# Patient Record
Sex: Male | Born: 1945
Health system: Southern US, Community
[De-identification: ages and names within clinical notes are randomized; demographics above are authoritative.]

## PROBLEM LIST (undated history)

## (undated) DIAGNOSIS — H409 Unspecified glaucoma: Secondary | ICD-10-CM

## (undated) DIAGNOSIS — I714 Abdominal aortic aneurysm, without rupture, unspecified: Secondary | ICD-10-CM

## (undated) DIAGNOSIS — Z973 Presence of spectacles and contact lenses: Secondary | ICD-10-CM

## (undated) DIAGNOSIS — C3490 Malignant neoplasm of unspecified part of unspecified bronchus or lung: Secondary | ICD-10-CM

## (undated) DIAGNOSIS — E785 Hyperlipidemia, unspecified: Secondary | ICD-10-CM

## (undated) DIAGNOSIS — I219 Acute myocardial infarction, unspecified: Secondary | ICD-10-CM

## (undated) DIAGNOSIS — C801 Malignant (primary) neoplasm, unspecified: Secondary | ICD-10-CM

## (undated) DIAGNOSIS — I10 Essential (primary) hypertension: Secondary | ICD-10-CM

## (undated) DIAGNOSIS — I2510 Atherosclerotic heart disease of native coronary artery without angina pectoris: Secondary | ICD-10-CM

## (undated) HISTORY — DX: Essential (primary) hypertension: I10

## (undated) HISTORY — DX: Acute myocardial infarction, unspecified: I21.9

## (undated) HISTORY — DX: Hyperlipidemia, unspecified: E78.5

## (undated) HISTORY — DX: Abdominal aortic aneurysm, without rupture: I71.4

## (undated) HISTORY — PX: APPENDECTOMY: SHX54

## (undated) HISTORY — PX: EYE SURGERY: SHX253

## (undated) HISTORY — DX: Atherosclerotic heart disease of native coronary artery without angina pectoris: I25.10

## (undated) HISTORY — PX: COLONOSCOPY W/ BIOPSIES AND POLYPECTOMY: SHX1376

## (undated) HISTORY — DX: Abdominal aortic aneurysm, without rupture, unspecified: I71.40

---

## 1999-07-25 ENCOUNTER — Ambulatory Visit (HOSPITAL_COMMUNITY): Admission: RE | Admit: 1999-07-25 | Discharge: 1999-07-25 | Payer: Self-pay | Admitting: Gastroenterology

## 1999-07-25 ENCOUNTER — Encounter (INDEPENDENT_AMBULATORY_CARE_PROVIDER_SITE_OTHER): Payer: Self-pay

## 2002-08-19 ENCOUNTER — Ambulatory Visit (HOSPITAL_COMMUNITY): Admission: RE | Admit: 2002-08-19 | Discharge: 2002-08-19 | Payer: Self-pay | Admitting: Gastroenterology

## 2002-08-19 ENCOUNTER — Encounter (INDEPENDENT_AMBULATORY_CARE_PROVIDER_SITE_OTHER): Payer: Self-pay | Admitting: Specialist

## 2003-03-02 DIAGNOSIS — I219 Acute myocardial infarction, unspecified: Secondary | ICD-10-CM

## 2003-03-02 HISTORY — DX: Acute myocardial infarction, unspecified: I21.9

## 2003-03-15 HISTORY — PX: OTHER SURGICAL HISTORY: SHX169

## 2004-05-20 ENCOUNTER — Emergency Department (HOSPITAL_COMMUNITY): Admission: EM | Admit: 2004-05-20 | Discharge: 2004-05-21 | Payer: Self-pay | Admitting: *Deleted

## 2004-05-22 ENCOUNTER — Ambulatory Visit (HOSPITAL_COMMUNITY): Admission: RE | Admit: 2004-05-22 | Discharge: 2004-05-22 | Payer: Self-pay | Admitting: Emergency Medicine

## 2004-12-07 ENCOUNTER — Encounter (INDEPENDENT_AMBULATORY_CARE_PROVIDER_SITE_OTHER): Payer: Self-pay | Admitting: Specialist

## 2004-12-07 ENCOUNTER — Observation Stay (HOSPITAL_COMMUNITY): Admission: RE | Admit: 2004-12-07 | Discharge: 2004-12-08 | Payer: Self-pay | Admitting: Surgery

## 2005-01-29 HISTORY — PX: CHOLECYSTECTOMY: SHX55

## 2009-11-10 ENCOUNTER — Ambulatory Visit: Payer: Self-pay | Admitting: Vascular Surgery

## 2009-11-17 ENCOUNTER — Encounter: Admission: RE | Admit: 2009-11-17 | Discharge: 2009-11-17 | Payer: Self-pay | Admitting: Vascular Surgery

## 2009-11-17 ENCOUNTER — Ambulatory Visit: Payer: Self-pay | Admitting: Vascular Surgery

## 2010-05-19 ENCOUNTER — Encounter (INDEPENDENT_AMBULATORY_CARE_PROVIDER_SITE_OTHER): Payer: Medicare Other

## 2010-05-19 DIAGNOSIS — I714 Abdominal aortic aneurysm, without rupture, unspecified: Secondary | ICD-10-CM

## 2010-05-25 NOTE — Procedures (Unsigned)
DUPLEX ULTRASOUND OF ABDOMINAL AORTA  INDICATION:  Abdominal aortic aneurysm.  HISTORY: Diabetes:  No Cardiac:  MI, stents 2005 Hypertension:  Yes Smoking:  Previous to cigarettes, currently smoking cigars Connective Tissue Disorder: Family History:  No Previous Surgery:  No  DUPLEX EXAM:         AP (cm)                   TRANSVERSE (cm) Proximal             1.77 cm                   1.75 cm Mid                  4.10 cm                   4.22 cm Distal               2.38 cm                   2.36 cm Right Iliac          2.03 cm                   1.83 cm Left Iliac           1.30 cm                   1.48 cm  PREVIOUS:  Date: November 17, 2009  AP:  4.1 cm  TRANSVERSE:  IMPRESSION: 1. Infrarenal abdominal aortic aneurysm present with a measurement of     4.10 cm x 4.22 cm with intramural thrombus present. 2. Aneurysmal dilatation of the right common iliac artery measuring     2.03 cm x 1.83 cm with intramural thrombus present also. 3. Diameter of infrarenal abdominal aortic aneurysm consistent with CT     on November 17, 2009.  ___________________________________________ Janetta Hora. Fields, MD  SH/MEDQ  D:  05/19/2010  T:  05/19/2010  Job:  [id number]

## 2010-06-13 NOTE — Assessment & Plan Note (Signed)
OFFICE VISIT   DRAGAN, TAMBURRINO  DOB:  [birth year]                                       11/10/2009  VWUJW#:[id number]   CHIEF COMPLAINT:  Abdominal aortic aneurysm.   HISTORY OF PRESENT ILLNESS:  The patient is a 65 year old male referred  by Dr. Eldridge Dace for evaluation of abdominal aortic aneurysm.  His  aneurysm was initially found on ultrasound screening in April 2011.  It  is found to be 3.8 cm in diameter at that time.  He had a recent follow-  up ultrasound which showed that the aneurysm is now 4.6 cm in diameter.  He denies any chronic back pain.  He has had a little bit of epigastric  pain which has been on and off earlier this week.  He denies other  abdominal pain.  He has no family history of abdominal aortic aneurysm.  Previous abdominal procedures include a cholecystectomy and an  appendectomy.   Chronic medical problems include coronary artery disease, hypertension,  elevated cholesterol.  These are all currently stable and followed by  Dr. Gerri Spore and Dr. Eldridge Dace.   PAST MEDICAL HISTORY:  Otherwise noncontributory.   SOCIAL HISTORY:  He is widowed.  He has 1 child.  He smokes 1 to 2  cigars per day.  He does not consume alcohol regularly.   FAMILY HISTORY:  Noncontributory, as mentioned above.   A full 12-point review of systems was performed with the patient today.  Please see intake referral form for details regarding this.   Approximately 15 pages of outside medical records from Dr. Hoyle Barr  office were reviewed today as part of his exam.   MEDICATIONS:  Include tamsulosin 0.4 mg after meals, ramipril 5 mg once  daily, metoprolol 25 mg once daily, Aleve 220 mg 2 daily, aspirin 81 mg  once daily, Azopt eyedrops to right eye once daily, Lantaprost eye drops  right eye once daily.   He has no known drug allergies.   PHYSICAL EXAMINATION:  Blood pressure is 132/85 in the left arm.  Heart  rate is 86 and regular.  Oxygen  saturation is 99% on room air.  HEENT:  Unremarkable.  Neck has 2+ carotid pulses without bruit.  Chest:  Clear  to auscultation.  Cardiac:  Regular rate and rhythm without murmur.  Abdomen:  Soft, nontender, nondistended.  He does have an easily  palpable epigastric pulsatile mass.  This is also nontender.  Extremities:  He has 2+ radial and femoral, 1+ popliteal, and 2+  posterior tibial pulses bilaterally.  He has absent dorsalis pedis  pulses.  Musculoskeletal exam shows no major joint deformities.  Neurologic exam shows symmetric upper extremity and lower extremity  motor strength, 5/5.  Skin has no open ulcers or rashes.   Patient apparently has a growing abdominal aortic aneurysm.  I discussed  with him today that ultrasound can sometimes have discrepancies in size,  and we will obtain a CT angiogram to abdomen and pelvis to further  define the anatomy of his aneurysm.  If this is relatively small in  size, we may consider further CT scan surveillance for awhile before  considering repair since he is currently asymptomatic.  However, if the  aneurysm is 5 to 5.5 cm in diameter, we would consider repair.  All of  this was discussed with the patient today as  well as possible techniques  for repair, including open and endovascular stent graft repair.     Janetta Hora. Fields, MD  Electronically Signed   CEF/MEDQ  D:  11/10/2009  T:  11/10/2009  Job:  [id number]   cc:   Corky Crafts, MD  Carola J. Gerri Spore, M.D.

## 2010-06-13 NOTE — Assessment & Plan Note (Signed)
OFFICE VISIT   Riley Sharp, Riley Sharp  DOB:  11-11-1945                                       11/17/2009  EAVWU#:[id number]   The patient returns for followup today after his CT scan of the abdomen  and pelvis to further evaluate his abdominal aortic aneurysm.  He  continues to deny any abdominal or back pain.   REVIEW OF SYSTEMS:  He denies any chest pain or shortness of breath.   PHYSICAL EXAM:  Today blood pressure is 120/75 in the left arm, heart  rate is 81 and regular.  Abdomen is soft, nontender, nondistended.  Epigastric mass which is pulsatile is palpable but nontender.   I reviewed his CT angiogram of the abdomen and pelvis today which on my  measurement shows the aneurysm is approximately 4.5 cm in diameter.  At  this point I do not believe that he needs repair of the aneurysm.  However, he certainly warrants close followup as the aneurysm may  continue to grow over time.  We will arrange for him to have a followup  ultrasound in six months' time.  The aneurysm did seem like it would be  amendable to stent graft repair if it ever grows to the size of 5 to 5.5  cm in diameter.  All this was discussed with the patient today.  He will  see me again in six months' time.     Janetta Hora. Fields, MD  Electronically Signed   CEF/MEDQ  D:  11/17/2009  T:  11/18/2009  Job:  [id number]   cc:   Corky Crafts, MD  Carola J. Gerri Spore, M.D.

## 2010-06-16 NOTE — Procedures (Signed)
[facility]  Patient:    Riley Sharp, Riley Sharp                        MRN: [id number] Proc. Date: 07/25/99 Adm. Date:  [id number] Disc. Date: [id number] Attending:  Nelda Marseille CC:         Petra Kuba, M.D.             Oley Balm Georgina Pillion, M.D.                           Procedure Report  PROCEDURE:  Colonoscopy with polypectomy.  INDICATIONS:  Guaiac positivity in a patient due for colonic screening. Consent was signed after risks, benefits, method and options thoroughly discussed in the office.  MEDICINES USED:  Demerol 80 mg, Versed 8 mg.  DESCRIPTION OF PROCEDURE:  Rectal inspection was pertinent for external hemorrhoids.  Digital exam was negative.  The video colonoscope was inserted and easily advanced around the colon to the cecum.  This did not require any abdominal pressure or any position changes.  On insertion into the distal sigmoid a pedunculated polyp was seen.  There was also some left-sided diverticula seen but no other abnormalities.  The cecum was identified by the appendiceal orifice and the ileocecal valve.  The preparation was adequate. There was a fair amount od liquid stool that required frequent washing and suctioning.  Colonoscope was withdrawn through the colon.  Adequate visualization was obtain.  In the ascending colon two tiny small polyps were seen and were each hot biopsied x 1.  In the hepatic flexure a 3 mm polyp was seen and hot biopsied x 1.  The scope was further withdrawn.  No other polypoid lesions or masses were seen as we slowly withdrew back to the distal sigmoid where the 8 mm polyp seen on insertion was seen.  Snare and electrocautery applied and the polyp was suctioned through the scope and collected in the trap.  The scope was further withdrawn back to the rectum in retroflexion, revealing some small internal hemorrhoids.  The scope was straightened and re-advanced a short ways up the sigmoid.  Air was  suctioned and scope removed.  Patient tolerated the procedure well.  There was no obvious immediate complication.  ENDOSCOPIC DIAGNOSES: 1. Internal and external hemorrhoids. 2. Left-sided diverticula. 3. An 8 mm sigmoid polyp, status post snare. 4. Three hepatic flexure ascending small to tiny polyps, hot biopsied. 5. Otherwise within normal limits to the cecum.  PLAN:  Will await pathology to determine future follow-up colonic screening. Gastrointestinal follow-up p.r.n. or in six weeks to recheck guaiac symptoms and make sure no further workup plans are needed.  Happy to see back here p.r.n. DD:  07/25/99 TD:  07/26/99 Job: [id number]

## 2010-06-16 NOTE — Op Note (Signed)
NAMEALIREZA, POLLACK NO.:  [id number]   MEDICAL RECORD NO.:  [id number]          PATIENT TYPE:  INP   LOCATION:  X006                         FACILITY:  [facility]   PHYSICIAN:  Currie Paris, M.D.DATE OF BIRTH:  1945-04-04   DATE OF PROCEDURE:  DATE OF DISCHARGE:                                 OPERATIVE REPORT   CONTINUATION OF OPERATIVE NOTE:  A  0.25% plain Marcaine was used for each  incision. The umbilical incision was made, the fascia identified and opened  and the peritoneal cavity entered under direct vision. A pursestring was  placed, the Hasson introduced and the abdomen insufflated to 15. The patient  was placed in reverse Trendelenburg and tilted to the left. A 10/11 trocar  was placed in the epigastrium and two 5s laterally. These were done under  direct vision.   The gallbladder was retracted over the liver. It was somewhat fat encased  but this was able to be dissected off. I was able to make a nice window  behind the cystic duct and the triangle of Calot and see the cystic duct and  the cystic artery.   I put a clip on one of the branches of the cystic artery and another on the  cystic duct at its junction with the gallbladder.   A Cook catheter was introduced percutaneously and placed in the cystic duct  and the cystic duct will cholangiogram was done with normal findings, good  flow into the duodenum, no filling defects and normal appearing anatomy.   The cystic duct catheter was removed and three clips placed on the stay side  of the cystic duct. It was divided. The cystic artery was further dissected  out and additional clips placed and it was divided. The posterior branch was  also clipped as we came up the gallbladder.   The gallbladder was removed from below to above. It was somewhat  intrahepatic. There was really not a good plane of dissection. Once it had  been disconnected, I spent several minutes irrigating and cauterizing the  bed to make sure there was no bleeding. I did leave a little Surgicel there  once everything appeared to be dry.   The gallbladder was brought out the umbilical port. That site was closed  with a pursestring. The abdomen was reinsufflated and a final check for  hemostasis made and a  final irrigation done.   The lateral ports removed under direct vision, there was no bleeding. The  abdomen was deflated through the epigastric port. This was also closed with  a #0 Vicryl. The skin was closed with 4-0 Monocryl subcuticular plus  Dermabond.   The patient tolerated the procedure well. There were no operative  complications. All counts were correct.     Currie Paris, M.D.  Electronically Signed    CJS/MEDQ  D:  12/07/2004  T:  12/07/2004  Job:  [id number]

## 2010-06-16 NOTE — Op Note (Signed)
Riley Sharp, Riley Sharp NO.:  [id number]   MEDICAL RECORD NO.:  [id number]                   PATIENT TYPE:  AMB   LOCATION:  ENDO                                 FACILITY:  [facility]   PHYSICIAN:  Petra Kuba, M.D.                 DATE OF BIRTH:  1945/09/16   DATE OF PROCEDURE:  08/19/2002  DATE OF DISCHARGE:                                 OPERATIVE REPORT   PROCEDURE:  Colonoscopy with polypectomy.   INDICATIONS:  A patient with history of colon polyps; due for repeat  screening.  Consent was signed after risks, benefits, methods and options  were thoroughly discussed in the office in the past.   MEDICATIONS USED:  1. Demerol 80 mg .  2. Versed 8 mg.   DESCRIPTION OF PROCEDURE:  Rectal inspection is pertinent for small external  hemorrhoids.  Digital examination was negative.  Video colonoscope was  inserted, and with abdominal pressure was able to be advanced around the  colon to the cecum.  No position changes were needed.  Other than some left-  sided diverticula no abnormalities were seen.  The cecum was identified by  the appendiceal orifice and the ileocecal valve.  The prep was adequate;  there was some liquid stool that required washing and suctioning.  On slow  withdrawal through the colon on the ileocecal valve and the proximal  ascending colon, two questionable tiny polyps were seen and each cold  biopsied x1 or 2.  These were put in the same container.   The scope was further withdrawn.  Other than the left-sided diverticula, no  abnormalities were seen as we slowly withdrew back to the rectum.  In the  distal rectum a small polyp was seen and snare electrocautery applied.  The  polyp was suctioned through the scope and collected in the trap.  The scope  was retroflexed, revealing some small internal hemorrhoids.  The anal-rectal  pullthrough confirmed the hemorrhoids.  The scope was reinserted a short way  up the left side of the  colon.  Air was suctioned and scope removed.  The  patient tolerated the procedure well.  There was no obvious immediate  complication.   ENDOSCOPIC DIAGNOSES:  1. Internal/external hemorrhoids.  2. Left-sided diverticula.  3. Small rectal polyps there.  4. Two questionable tiny proximal ascending and ileocecal valve polyps; cold     biopsied.  5. Otherwise within normal limits to the cecum.    PLAN:  Await pathology.  Will probably recheck colon screening in four to  five years.  Will be happy to see back p.r.n.  Otherwise, return to the care  of Dr. Georgina Pillion for the customary health care maintenance, including yearly  rectals and guaiacs.  Petra Kuba, M.D.    MEM/MEDQ  D:  08/19/2002  T:  08/19/2002  Job:  [id number]   cc:   Oley Balm. Georgina Pillion, M.D.  [address]  [place]  [place] [address]  Fax: [phone]

## 2010-06-16 NOTE — Op Note (Signed)
NAMEMONTEZ, CUDA NO.:  [id number]   MEDICAL RECORD NO.:  [id number]          PATIENT TYPE:  INP   LOCATION:  X006                         FACILITY:  [facility]   PHYSICIAN:  Currie Paris, M.D.DATE OF BIRTH:  August 28, 1945   DATE OF PROCEDURE:  12/07/2004  DATE OF DISCHARGE:                                 OPERATIVE REPORT   PREOPERATIVE DIAGNOSIS:  Chronic calculus cholecystitis.   POSTOPERATIVE DIAGNOSIS:  Chronic calculus cholecystitis.   OPERATION:  Laparoscopic cholecystectomy and operative cholangiogram.   SURGEON:  Dr. Jamey Ripa.   ASSISTANT:  Timothy E. Earlene Plater, M.D.   ANESTHESIA:  General endotracheal.   CLINICAL HISTORY:  Mr. Domingo Cocking is a 65 year old gentleman who has had biliary  symptoms for quite some time. He was admitted now electively for  cholecystectomy.   DESCRIPTION OF PROCEDURE:  The patient was seen in the holding area, and  since he had not been in the office for while, we reviewed the plans in  detail. He had no further questions. His physical exam was unremarkable and  noted in the chart.   The patient was taken to the operating room, and after satisfactory general  endotracheal anesthesia had been obtained, the abdomen was clipped, prepped  and draped. The time-out occurred.   Plain Marcaine 0.25% was used for each incision. The umbilical incision was  made   Dictated ended at this point. Remainder as separate dictation      Currie Paris, M.D.  Electronically Signed     CJS/MEDQ  D:  12/07/2004  T:  12/07/2004  Job:  [id number]

## 2010-10-11 ENCOUNTER — Encounter: Payer: Self-pay | Admitting: Vascular Surgery

## 2010-11-23 ENCOUNTER — Ambulatory Visit (INDEPENDENT_AMBULATORY_CARE_PROVIDER_SITE_OTHER): Payer: Medicare Other | Admitting: Vascular Surgery

## 2010-11-23 ENCOUNTER — Encounter (INDEPENDENT_AMBULATORY_CARE_PROVIDER_SITE_OTHER): Payer: Medicare Other | Admitting: *Deleted

## 2010-11-23 ENCOUNTER — Encounter: Payer: Self-pay | Admitting: Vascular Surgery

## 2010-11-23 VITALS — BP 148/84 | HR 54 | Temp 98.2°F | Ht 68.0 in | Wt 170.0 lb

## 2010-11-23 DIAGNOSIS — I714 Abdominal aortic aneurysm, without rupture, unspecified: Secondary | ICD-10-CM

## 2010-11-23 NOTE — Progress Notes (Signed)
[facility] [place] HISTORY AND PHYSICAL   History of Present Illness:  Patient is a 65 y.o. year old male who presents for follow-up evaluation of AAA.  The patient denies new abdominal or back pain.  The patient's atherosclerotic risk factors remain elevated cholesterol, hypertension, smoking, and coronary artery disease.  These are all currently stable and followed by his primary care physician.  The patients AAA was 4.6 cm on intial evaluation in 2011.  It was 4.5 cm on subsequent scam.  Past Medical History  Diagnosis Date  . Hypertension   . Hyperlipidemia   . Myocardial infarction 03/2003  . AAA (abdominal aortic aneurysm)   . CAD (coronary artery disease)     Past Surgical History  Procedure Date  . Appendectomy   . Cholecystectomy     Review of Systems:  Neurologic: denies symptoms of TIA, amaurosis, or stroke Cardiac:denies shortness of breath or chest pain Pulmonary: denies cough or wheeze  Social History History  Substance Use Topics  . Smoking status: Current Everyday Smoker -- 2.0 packs/day for 45 years    Types: Cigars  . Smokeless tobacco: Not on file  . Alcohol Use: No     social drinker    Allergies  No Known Allergies   Current Outpatient Prescriptions  Medication Sig Dispense Refill  . aspirin EC 81 MG tablet Take 81 mg by mouth daily.        . brimonidine (ALPHAGAN) 0.2 % ophthalmic solution Place 1 drop into both eyes 2 (two) times daily.        . brinzolamide (AZOPT) 1 % ophthalmic suspension Place 1 drop into the right eye 3 (three) times daily.        Marland Kitchen latanoprost (XALATAN) 0.005 % ophthalmic solution Place 1 drop into the right eye at bedtime.        . metoprolol succinate (TOPROL-XL) 25 MG 24 hr tablet Take 25 mg by mouth daily.        . naproxen sodium (ANAPROX) 220 MG tablet Take 220 mg by mouth 2 (two) times daily with a meal.        . ramipril (ALTACE) 5 MG tablet Take 5 mg by mouth daily.        . timolol  (BETIMOL) 0.5 % ophthalmic solution Place 1 drop into both eyes daily.        . Tamsulosin HCl (FLOMAX) 0.4 MG CAPS Take 0.4 mg by mouth daily after supper.          Physical Examination  Filed Vitals:   11/23/10 0942  BP: 148/84  Pulse: 54  Temp: 98.2 F (36.8 C)  TempSrc: Oral  Height: 5\' 8"  (1.727 m)  Weight: 170 lb (77.111 kg)    Body mass index is 25.85 kg/(m^2).  General:  Alert and oriented, no acute distress HEENT: Normal Neck: No bruit or JVD Pulmonary: Clear to auscultation bilaterally Cardiac: Regular Rate and Rhythm without murmur Abdomen: Soft, non-tender, non-distended, normal bowel sounds, easily palpable pulsatile mass periumbilical to left  DATA: Aortic ultrasound was reviewed and interpreted for today. This shows his aneurysm diameter is currently 4.1 cm.   ASSESSMENT: Stable 4 cm abdominal aortic aneurysm. We will reschedule him for an aortic duplex ultrasound in 6 months time with followup M.D. visit as well   PLAN:  Fabienne Bruns, MD [facility] [place] Office: [phone] Pager: [phone]

## 2010-11-29 NOTE — Procedures (Unsigned)
DUPLEX ULTRASOUND OF ABDOMINAL AORTA  INDICATION:  AAA.  HISTORY: Diabetes:  No. Cardiac:  MI, stents in 2005. Hypertension:  Yes. Smoking:  Previous smoker of cigarettes, currently continues smoking cigars. Connective Tissue Disorder: Family History:  No. Previous Surgery:  No.  DUPLEX EXAM:         AP (cm)                   TRANSVERSE (cm) Proximal             Not visualized            Not visualized Mid                  4.07 cm                   4.01 cm Distal               2.39 cm                   2.42 cm Right Iliac          1.99 cm                   1.93 cm Left Iliac           1.32 cm                   1.32 cm  PREVIOUS:  Date: 05/19/2010  AP:  4.10  TRANSVERSE:  4.22  IMPRESSION: 1. Abdominal aortic aneurysm measuring 4.07 cm X 4.01 cm. 2. The proximal portion of the aorta could not be visualized due to     overlying bowel gas. 3. Stable in comparison to the previous examination.  ___________________________________________ Janetta Hora Darrick Penna, MD  EM/MEDQ  D:  11/23/2010  T:  11/23/2010  Job:  [id number]

## 2011-03-13 DIAGNOSIS — H409 Unspecified glaucoma: Secondary | ICD-10-CM | POA: Diagnosis not present

## 2011-03-13 DIAGNOSIS — H401290 Low-tension glaucoma, unspecified eye, stage unspecified: Secondary | ICD-10-CM | POA: Diagnosis not present

## 2011-03-13 DIAGNOSIS — Z83511 Family history of glaucoma: Secondary | ICD-10-CM | POA: Diagnosis not present

## 2011-04-06 DIAGNOSIS — H4010X0 Unspecified open-angle glaucoma, stage unspecified: Secondary | ICD-10-CM | POA: Diagnosis not present

## 2011-04-06 DIAGNOSIS — H409 Unspecified glaucoma: Secondary | ICD-10-CM | POA: Diagnosis not present

## 2011-04-06 DIAGNOSIS — H401290 Low-tension glaucoma, unspecified eye, stage unspecified: Secondary | ICD-10-CM | POA: Diagnosis not present

## 2011-04-24 DIAGNOSIS — I714 Abdominal aortic aneurysm, without rupture, unspecified: Secondary | ICD-10-CM | POA: Diagnosis not present

## 2011-04-24 DIAGNOSIS — I2510 Atherosclerotic heart disease of native coronary artery without angina pectoris: Secondary | ICD-10-CM | POA: Diagnosis not present

## 2011-04-24 DIAGNOSIS — E785 Hyperlipidemia, unspecified: Secondary | ICD-10-CM | POA: Diagnosis not present

## 2011-04-24 DIAGNOSIS — Z87891 Personal history of nicotine dependence: Secondary | ICD-10-CM | POA: Diagnosis not present

## 2011-05-15 DIAGNOSIS — I714 Abdominal aortic aneurysm, without rupture, unspecified: Secondary | ICD-10-CM | POA: Diagnosis not present

## 2011-05-15 DIAGNOSIS — I2510 Atherosclerotic heart disease of native coronary artery without angina pectoris: Secondary | ICD-10-CM | POA: Diagnosis not present

## 2011-05-15 DIAGNOSIS — F17200 Nicotine dependence, unspecified, uncomplicated: Secondary | ICD-10-CM | POA: Diagnosis not present

## 2011-05-15 DIAGNOSIS — Z23 Encounter for immunization: Secondary | ICD-10-CM | POA: Diagnosis not present

## 2011-05-15 DIAGNOSIS — Z79899 Other long term (current) drug therapy: Secondary | ICD-10-CM | POA: Diagnosis not present

## 2011-05-15 DIAGNOSIS — E7800 Pure hypercholesterolemia, unspecified: Secondary | ICD-10-CM | POA: Diagnosis not present

## 2011-05-15 DIAGNOSIS — I10 Essential (primary) hypertension: Secondary | ICD-10-CM | POA: Diagnosis not present

## 2011-05-15 DIAGNOSIS — Z0000 Encounter for general adult medical examination without abnormal findings: Secondary | ICD-10-CM | POA: Diagnosis not present

## 2011-05-17 ENCOUNTER — Other Ambulatory Visit: Payer: Self-pay | Admitting: *Deleted

## 2011-05-17 DIAGNOSIS — I714 Abdominal aortic aneurysm, without rupture, unspecified: Secondary | ICD-10-CM

## 2011-05-24 ENCOUNTER — Encounter (INDEPENDENT_AMBULATORY_CARE_PROVIDER_SITE_OTHER): Payer: Medicare Other | Admitting: *Deleted

## 2011-05-24 DIAGNOSIS — I714 Abdominal aortic aneurysm, without rupture, unspecified: Secondary | ICD-10-CM

## 2011-05-25 DIAGNOSIS — H2510 Age-related nuclear cataract, unspecified eye: Secondary | ICD-10-CM | POA: Diagnosis not present

## 2011-05-25 DIAGNOSIS — H401290 Low-tension glaucoma, unspecified eye, stage unspecified: Secondary | ICD-10-CM | POA: Diagnosis not present

## 2011-05-25 DIAGNOSIS — Z83511 Family history of glaucoma: Secondary | ICD-10-CM | POA: Diagnosis not present

## 2011-05-25 DIAGNOSIS — H409 Unspecified glaucoma: Secondary | ICD-10-CM | POA: Diagnosis not present

## 2011-05-31 ENCOUNTER — Other Ambulatory Visit: Payer: Self-pay | Admitting: *Deleted

## 2011-05-31 DIAGNOSIS — H409 Unspecified glaucoma: Secondary | ICD-10-CM | POA: Diagnosis not present

## 2011-05-31 DIAGNOSIS — H4010X0 Unspecified open-angle glaucoma, stage unspecified: Secondary | ICD-10-CM | POA: Diagnosis not present

## 2011-05-31 DIAGNOSIS — I714 Abdominal aortic aneurysm, without rupture, unspecified: Secondary | ICD-10-CM

## 2011-05-31 DIAGNOSIS — H401290 Low-tension glaucoma, unspecified eye, stage unspecified: Secondary | ICD-10-CM | POA: Diagnosis not present

## 2011-06-01 ENCOUNTER — Encounter: Payer: Self-pay | Admitting: Vascular Surgery

## 2011-06-01 NOTE — Procedures (Unsigned)
DUPLEX ULTRASOUND OF ABDOMINAL AORTA  INDICATION:  Abdominal aortic aneurysm  HISTORY: Diabetes:  No Cardiac:  MI, stents in 2005 Hypertension:  Yes Smoking:  Previous cigarette smoker, current cigar smoker. Connective Tissue Disorder: Family History:  No Previous Surgery:  No  DUPLEX EXAM:         AP (cm)                   TRANSVERSE (cm) Proximal             2.62 cm Mid                  4.07 cm                   4.13 cm Distal               Not visualized.           Not visualized Right Iliac          2.02 cm                   2.17 cm Left Iliac           1.35 cm                   1.38 cm  PREVIOUS:  Date: 11/23/2010  AP:  4.07  TRANSVERSE:  4.01  IMPRESSION:  Stable AAA as compared with previous exam, with largest measurement taken today of 4.07 x 4.13 cm.  ___________________________________________ Janetta Hora. Fields, MD  EM/MEDQ  D:  05/24/2011  T:  05/24/2011  Job:  [id number]

## 2011-07-13 DIAGNOSIS — H401290 Low-tension glaucoma, unspecified eye, stage unspecified: Secondary | ICD-10-CM | POA: Diagnosis not present

## 2011-11-27 DIAGNOSIS — J069 Acute upper respiratory infection, unspecified: Secondary | ICD-10-CM | POA: Diagnosis not present

## 2011-12-31 DIAGNOSIS — H409 Unspecified glaucoma: Secondary | ICD-10-CM | POA: Diagnosis not present

## 2011-12-31 DIAGNOSIS — H2510 Age-related nuclear cataract, unspecified eye: Secondary | ICD-10-CM | POA: Diagnosis not present

## 2011-12-31 DIAGNOSIS — H401290 Low-tension glaucoma, unspecified eye, stage unspecified: Secondary | ICD-10-CM | POA: Diagnosis not present

## 2012-02-01 ENCOUNTER — Encounter: Payer: Self-pay | Admitting: Neurosurgery

## 2012-02-04 ENCOUNTER — Encounter (INDEPENDENT_AMBULATORY_CARE_PROVIDER_SITE_OTHER): Payer: Medicare Other | Admitting: *Deleted

## 2012-02-04 ENCOUNTER — Encounter: Payer: Self-pay | Admitting: Neurosurgery

## 2012-02-04 ENCOUNTER — Ambulatory Visit (INDEPENDENT_AMBULATORY_CARE_PROVIDER_SITE_OTHER): Payer: Medicare Other | Admitting: Neurosurgery

## 2012-02-04 VITALS — BP 127/80 | HR 67 | Resp 16 | Ht 68.0 in | Wt 170.0 lb

## 2012-02-04 DIAGNOSIS — M79609 Pain in unspecified limb: Secondary | ICD-10-CM

## 2012-02-04 DIAGNOSIS — I714 Abdominal aortic aneurysm, without rupture, unspecified: Secondary | ICD-10-CM

## 2012-02-04 NOTE — Progress Notes (Signed)
[facility] [place] AAA/Carotid Office Note  CC: AAA surveillance Referring Physician: Fields  History of Present Illness: 67 year old male patient of Dr. Darrick Penna followed for known AAA. The patient denies any unusual abdominal or back pain. The patient denies any new medical diagnoses recent surgery.  Past Medical History  Diagnosis Date  . Hypertension   . Hyperlipidemia   . Myocardial infarction 03/2003  . AAA (abdominal aortic aneurysm)   . CAD (coronary artery disease)     ROS: [x]  Positive   [ ]  Denies    General: [ ]  Weight loss, [ ]  Fever, [ ]  chills Neurologic: [ ]  Dizziness, [ ]  Blackouts, [ ]  Seizure [ ]  Stroke, [ ]  "Mini stroke", [ ]  Slurred speech, [ ]  Temporary blindness; [ ]  weakness in arms or legs, [ ]  Hoarseness Cardiac: [ ]  Chest pain/pressure, [ ]  Shortness of breath at rest [ ]  Shortness of breath with exertion, [ ]  Atrial fibrillation or irregular heartbeat Vascular: [ ]  Pain in legs with walking, [ ]  Pain in legs at rest, [ ]  Pain in legs at night,  [ ]  Non-healing ulcer, [ ]  Blood clot in vein/DVT,   Pulmonary: [ ]  Home oxygen, [ ]  Productive cough, [ ]  Coughing up blood, [ ]  Asthma,  [ ]  Wheezing Musculoskeletal:  [ ]  Arthritis, [ ]  Low back pain, [ ]  Joint pain Hematologic: [ ]  Easy Bruising, [ ]  Anemia; [ ]  Hepatitis Gastrointestinal: [ ]  Blood in stool, [ ]  Gastroesophageal Reflux/heartburn, [ ]  Trouble swallowing Urinary: [ ]  chronic Kidney disease, [ ]  on HD - [ ]  MWF or [ ]  TTHS, [ ]  Burning with urination, [ ]  Difficulty urinating Skin: [ ]  Rashes, [ ]  Wounds Psychological: [ ]  Anxiety, [ ]  Depression   Social History History  Substance Use Topics  . Smoking status: Current Every Day Smoker -- 2.0 packs/day for 45 years    Types: Cigars  . Smokeless tobacco: Never Used  . Alcohol Use: No     Comment: social drinker    Family History Family History  Problem Relation Age of Onset  . Hypertension Other   . Heart disease  Other   . Cancer Other   . Cancer Father   . Heart disease Father   . Hyperlipidemia Father   . Hypertension Father     No Known Allergies  Current Outpatient Prescriptions  Medication Sig Dispense Refill  . aspirin EC 81 MG tablet Take 81 mg by mouth daily.        . brimonidine (ALPHAGAN) 0.2 % ophthalmic solution Place 1 drop into both eyes 2 (two) times daily.        . brinzolamide (AZOPT) 1 % ophthalmic suspension Place 1 drop into the right eye 3 (three) times daily.        Marland Kitchen latanoprost (XALATAN) 0.005 % ophthalmic solution Place 1 drop into the right eye at bedtime.        . metoprolol succinate (TOPROL-XL) 25 MG 24 hr tablet Take 25 mg by mouth daily.        . naproxen sodium (ANAPROX) 220 MG tablet Take 220 mg by mouth 2 (two) times daily with a meal.        . ramipril (ALTACE) 5 MG tablet Take 5 mg by mouth daily.        . timolol (BETIMOL) 0.5 % ophthalmic solution Place 1 drop into both eyes daily.        . Tamsulosin HCl (  FLOMAX) 0.4 MG CAPS Take 0.4 mg by mouth daily after supper.          Physical Examination  Filed Vitals:   02/04/12 0908  BP: 127/80  Pulse: 67  Resp: 16    Body mass index is 25.85 kg/(m^2).  General:  WDWN in NAD Gait: Normal HEENT: WNL Eyes: Pupils equal Pulmonary: normal non-labored breathing , without Rales, rhonchi,  wheezing Cardiac: RRR, without  Murmurs, rubs or gallops; Abdomen: soft, NT, no masses Skin: no rashes, ulcers noted  Vascular Exam Pulses: 3+ radial pulses bilaterally, nontender palpable pulsatile abdominal mass is noted Carotid bruits: Carotid pulses to auscultation Extremities without ischemic changes, no Gangrene , no cellulitis; no open wounds;  Musculoskeletal: no muscle wasting or atrophy   Neurologic: A&O X 3; Appropriate Affect ; SENSATION: normal; MOTOR FUNCTION:  moving all extremities equally. Speech is fluent/normal  Non-Invasive Vascular Imaging AAA duplex shows a maximum diameter of 4.12 x 4.21, and  he was 4.07 in April which shows an essentially stable AAA  ASSESSMENT/PLAN: Asymptomatic AAA that will followup in one year with repeat duplex. The patient knows the signs and symptoms of rupture and knows to call 911 should this occur. The patient's questions were encouraged and answered, he is in agreement with this plan.  Lauree Chandler ANP   Clinic MD: [facility]

## 2012-02-04 NOTE — Addendum Note (Signed)
Addended by: Melodye Ped C on: 02/04/2012 11:10 AM   Modules accepted: Orders

## 2012-02-06 DIAGNOSIS — L570 Actinic keratosis: Secondary | ICD-10-CM | POA: Diagnosis not present

## 2012-02-06 DIAGNOSIS — Z85828 Personal history of other malignant neoplasm of skin: Secondary | ICD-10-CM | POA: Diagnosis not present

## 2012-02-06 DIAGNOSIS — L280 Lichen simplex chronicus: Secondary | ICD-10-CM | POA: Diagnosis not present

## 2012-02-06 DIAGNOSIS — D485 Neoplasm of uncertain behavior of skin: Secondary | ICD-10-CM | POA: Diagnosis not present

## 2012-05-09 DIAGNOSIS — I714 Abdominal aortic aneurysm, without rupture, unspecified: Secondary | ICD-10-CM | POA: Diagnosis not present

## 2012-05-09 DIAGNOSIS — I10 Essential (primary) hypertension: Secondary | ICD-10-CM | POA: Diagnosis not present

## 2012-05-09 DIAGNOSIS — I2510 Atherosclerotic heart disease of native coronary artery without angina pectoris: Secondary | ICD-10-CM | POA: Diagnosis not present

## 2012-05-09 DIAGNOSIS — E785 Hyperlipidemia, unspecified: Secondary | ICD-10-CM | POA: Diagnosis not present

## 2012-05-09 DIAGNOSIS — R079 Chest pain, unspecified: Secondary | ICD-10-CM | POA: Diagnosis not present

## 2012-05-21 ENCOUNTER — Ambulatory Visit: Payer: Medicare Other | Admitting: Neurosurgery

## 2012-05-27 DIAGNOSIS — R209 Unspecified disturbances of skin sensation: Secondary | ICD-10-CM | POA: Diagnosis not present

## 2012-05-27 DIAGNOSIS — Z125 Encounter for screening for malignant neoplasm of prostate: Secondary | ICD-10-CM | POA: Diagnosis not present

## 2012-05-27 DIAGNOSIS — Z0000 Encounter for general adult medical examination without abnormal findings: Secondary | ICD-10-CM | POA: Diagnosis not present

## 2012-05-27 DIAGNOSIS — Z86010 Personal history of colonic polyps: Secondary | ICD-10-CM | POA: Diagnosis not present

## 2012-05-27 DIAGNOSIS — Z79899 Other long term (current) drug therapy: Secondary | ICD-10-CM | POA: Diagnosis not present

## 2012-05-27 DIAGNOSIS — F17200 Nicotine dependence, unspecified, uncomplicated: Secondary | ICD-10-CM | POA: Diagnosis not present

## 2012-05-27 DIAGNOSIS — I10 Essential (primary) hypertension: Secondary | ICD-10-CM | POA: Diagnosis not present

## 2012-05-27 DIAGNOSIS — I714 Abdominal aortic aneurysm, without rupture, unspecified: Secondary | ICD-10-CM | POA: Diagnosis not present

## 2012-05-27 DIAGNOSIS — I2510 Atherosclerotic heart disease of native coronary artery without angina pectoris: Secondary | ICD-10-CM | POA: Diagnosis not present

## 2012-05-27 DIAGNOSIS — E7800 Pure hypercholesterolemia, unspecified: Secondary | ICD-10-CM | POA: Diagnosis not present

## 2012-06-20 DIAGNOSIS — R0989 Other specified symptoms and signs involving the circulatory and respiratory systems: Secondary | ICD-10-CM | POA: Diagnosis not present

## 2012-06-26 DIAGNOSIS — R6889 Other general symptoms and signs: Secondary | ICD-10-CM | POA: Diagnosis not present

## 2012-07-01 DIAGNOSIS — H409 Unspecified glaucoma: Secondary | ICD-10-CM | POA: Diagnosis not present

## 2012-07-01 DIAGNOSIS — H401290 Low-tension glaucoma, unspecified eye, stage unspecified: Secondary | ICD-10-CM | POA: Diagnosis not present

## 2012-07-01 DIAGNOSIS — H2510 Age-related nuclear cataract, unspecified eye: Secondary | ICD-10-CM | POA: Diagnosis not present

## 2012-12-09 DIAGNOSIS — I10 Essential (primary) hypertension: Secondary | ICD-10-CM | POA: Diagnosis not present

## 2012-12-09 DIAGNOSIS — R059 Cough, unspecified: Secondary | ICD-10-CM | POA: Diagnosis not present

## 2012-12-09 DIAGNOSIS — R05 Cough: Secondary | ICD-10-CM | POA: Diagnosis not present

## 2012-12-09 DIAGNOSIS — Z23 Encounter for immunization: Secondary | ICD-10-CM | POA: Diagnosis not present

## 2012-12-23 DIAGNOSIS — L820 Inflamed seborrheic keratosis: Secondary | ICD-10-CM | POA: Diagnosis not present

## 2012-12-23 DIAGNOSIS — L570 Actinic keratosis: Secondary | ICD-10-CM | POA: Diagnosis not present

## 2012-12-23 DIAGNOSIS — Z85828 Personal history of other malignant neoplasm of skin: Secondary | ICD-10-CM | POA: Diagnosis not present

## 2012-12-31 DIAGNOSIS — R6889 Other general symptoms and signs: Secondary | ICD-10-CM | POA: Diagnosis not present

## 2013-01-09 DIAGNOSIS — H2513 Age-related nuclear cataract, bilateral: Secondary | ICD-10-CM | POA: Insufficient documentation

## 2013-01-09 DIAGNOSIS — H2510 Age-related nuclear cataract, unspecified eye: Secondary | ICD-10-CM | POA: Diagnosis not present

## 2013-01-09 DIAGNOSIS — H401290 Low-tension glaucoma, unspecified eye, stage unspecified: Secondary | ICD-10-CM | POA: Diagnosis not present

## 2013-01-09 DIAGNOSIS — H04123 Dry eye syndrome of bilateral lacrimal glands: Secondary | ICD-10-CM | POA: Insufficient documentation

## 2013-01-09 DIAGNOSIS — H04129 Dry eye syndrome of unspecified lacrimal gland: Secondary | ICD-10-CM | POA: Diagnosis not present

## 2013-01-09 DIAGNOSIS — H409 Unspecified glaucoma: Secondary | ICD-10-CM | POA: Diagnosis not present

## 2013-02-04 ENCOUNTER — Encounter: Payer: Self-pay | Admitting: Family

## 2013-02-05 ENCOUNTER — Encounter (INDEPENDENT_AMBULATORY_CARE_PROVIDER_SITE_OTHER): Payer: Self-pay

## 2013-02-05 ENCOUNTER — Ambulatory Visit (HOSPITAL_COMMUNITY)
Admission: RE | Admit: 2013-02-05 | Discharge: 2013-02-05 | Disposition: A | Discharge status: Home / Self Care | Payer: Medicare Other | Source: Ambulatory Visit | Attending: Family | Admitting: Family

## 2013-02-05 ENCOUNTER — Ambulatory Visit (INDEPENDENT_AMBULATORY_CARE_PROVIDER_SITE_OTHER): Payer: Medicare Other | Admitting: Family

## 2013-02-05 ENCOUNTER — Ambulatory Visit: Payer: Medicare Other | Admitting: Neurosurgery

## 2013-02-05 ENCOUNTER — Encounter: Payer: Self-pay | Admitting: Family

## 2013-02-05 ENCOUNTER — Other Ambulatory Visit: Payer: Medicare Other

## 2013-02-05 VITALS — BP 99/69 | HR 63 | Resp 16 | Ht 68.0 in | Wt 163.0 lb

## 2013-02-05 DIAGNOSIS — I714 Abdominal aortic aneurysm, without rupture, unspecified: Secondary | ICD-10-CM | POA: Insufficient documentation

## 2013-02-05 NOTE — Progress Notes (Signed)
[facility] [place]  Established Abdominal Aortic Aneurysm  History of Present Illness  Riley Sharp is a 68 y.o. ([birth year]) male patient of Dr. Oneida Alar followed for known AAA, he returns today for Duplex surveillance. He exercises 3-4 days/week, rare ETOH consumption. The patient does have chronic back issues with associated feet numbness, states his PCP is aware.  He denies abdominal pain.  The patient is a smoker. The patient denies claudication in legs with walking, denies non-healing wounds. The patient denies history of stroke or TIA symptoms. He reports that he did a lot of coughing last month, "a bad cough", that has mostly resolved, he saw his PCP re this. MI in 2005, cardiac stent placed. He has bilateral 4th toes intermittent numbness aggravated by sitting.  Pt Diabetic: No Pt smoker: smoker  (age 70 to 2005 cigarettes 1.5 ppd, since then 2 cigars daily)  Past Medical History  Diagnosis Date  . Hypertension   . Hyperlipidemia   . Myocardial infarction 03/2003  . AAA (abdominal aortic aneurysm)   . CAD (coronary artery disease)    Past Surgical History  Procedure Laterality Date  . Appendectomy    . Cholecystectomy     Social History History   Social History  . Marital Status: Single    Spouse Name: N/A    Number of Children: N/A  . Years of Education: N/A   Occupational History  . Not on file.   Social History Main Topics  . Smoking status: Current Every Day Smoker -- 2.00 packs/day for 45 years    Types: Cigars  . Smokeless tobacco: Never Used  . Alcohol Use: No     Comment: social drinker  . Drug Use:   . Sexual Activity:    Other Topics Concern  . Not on file   Social History Narrative  . No narrative on file   Family History Family History  Problem Relation Age of Onset  . Hypertension Other   . Heart disease Other   . Cancer Other   . Cancer Father   . Heart disease Father   . Hyperlipidemia Father   .  Hypertension Father     Current Outpatient Prescriptions on File Prior to Visit  Medication Sig Dispense Refill  . aspirin EC 81 MG tablet Take 81 mg by mouth daily.        . brimonidine (ALPHAGAN) 0.2 % ophthalmic solution Place 1 drop into both eyes 2 (two) times daily.        . brinzolamide (AZOPT) 1 % ophthalmic suspension Place 1 drop into the right eye 3 (three) times daily.        Marland Kitchen latanoprost (XALATAN) 0.005 % ophthalmic solution Place 1 drop into the right eye at bedtime.        . metoprolol succinate (TOPROL-XL) 25 MG 24 hr tablet Take 25 mg by mouth daily.        . naproxen sodium (ANAPROX) 220 MG tablet Take 220 mg by mouth 2 (two) times daily with a meal.        . ramipril (ALTACE) 5 MG tablet Take 5 mg by mouth daily.        . Tamsulosin HCl (FLOMAX) 0.4 MG CAPS Take 0.4 mg by mouth daily after supper.        . timolol (BETIMOL) 0.5 % ophthalmic solution Place 1 drop into both eyes daily.         No current facility-administered medications on file prior to  visit.   No Known Allergies  ROS: See HPI for pertinent positives and negatives.  Physical Examination  Filed Vitals:   02/05/13 0938  BP: 99/69  Pulse: 63  Resp: 16   Filed Weights   02/05/13 0938  Weight: 163 lb (73.936 kg)   Body mass index is 24.79 kg/(m^2).  General: A&O x 3, WD.  Pulmonary: Sym exp, good air movt, CTAB, no rales, rhonchi, or wheezing.   Cardiac: RRR, Nl S1, S2, no Murmurs, rubs or gallops.  Carotid Bruits Left Right   Negative Negative   Aorta is is palpable Radial pulses are 2+ and =                          VASCULAR EXAM:                                                                                                         LE Pulses LEFT RIGHT       FEMORAL   palpable   palpable        POPLITEAL  not palpable   not palpable       POSTERIOR TIBIAL   palpable    palpable        DORSALIS PEDIS      ANTERIOR TIBIAL not palpable  not palpable     Gastrointestinal:  soft, NTND, -G/R, - HSM, - masses, - CVAT B.  Musculoskeletal: M/S 5/5 throughout, Extremities without ischemic changes.   Neurologic: CN 2-12 intact, Pain and light touch intact in extremities, Motor exam as listed above.  Non-Invasive Vascular Imaging  AAA Duplex (02/05/2013)  Previous size: 4.12 cm (Date: 02/04/2012)  Current size:  4.5 x 4.4 cm (Date: 02/05/2013)  Medical Decision Making  The patient is a 68 y.o. male who presents with asymptomatic AAA with slight increase in size.   Based on this patient's exam and diagnostic studies, the patient will follow up in 6 months with the following studies: AAA Duplex.  The threshold for repair is AAA size > 5.5 cm, growth > 1 cm/yr, and symptomatic status.  I emphasized the importance of maximal medical management including strict control of blood pressure, blood glucose, and lipid levels, antiplatelet agents, obtaining regular exercise, and cessation of smoking.   The patient was counseled re smoking cessation.  The patient was given information about AAA including signs, symptoms, treatment, and how to minimize the risk of enlargement and rupture of aneurysms.    The patient was advised to call 911 should the patient experience sudden onset abdominal or back pain.   Thank you for allowing [place] to participate in this patient's care.  Clemon Chambers, RN, MSN, FNP-C [facility] [place] Office: [facility] Physician: Oneida Alar  02/05/2013, 9:14 AM

## 2013-02-05 NOTE — Patient Instructions (Addendum)
Abdominal Aortic Aneurysm An aneurysm is a weakened or damaged part of an artery wall that bulges from the normal force of blood pumping through the body. An abdominal aortic aneurysm is an aneurysm that occurs in the lower part of the aorta, the main artery of the body.  The major concern with an abdominal aortic aneurysm is that it can enlarge and burst (rupture) or blood can flow between the layers of the wall of the aorta through a tear (aorticdissection). Both of these conditions can cause bleeding inside the body and can be life threatening unless diagnosed and treated promptly. CAUSES  The exact cause of an abdominal aortic aneurysm is unknown. Some contributing factors are:   A hardening of the arteries caused by the buildup of fat and other substances in the lining of a blood vessel (arteriosclerosis).  Inflammation of the walls of an artery (arteritis).   Connective tissue diseases, such as Marfan syndrome.   Abdominal trauma.   An infection, such as syphilis or staphylococcus, in the wall of the aorta (infectious aortitis) caused by bacteria. RISK FACTORS  Risk factors that contribute to an abdominal aortic aneurysm may include:  Age older than 60 years.   High blood pressure (hypertension).  Male gender.  Ethnicity (white race).  Obesity.  Family history of aneurysm (first degree relatives only).  Tobacco use. PREVENTION  The following healthy lifestyle habits may help decrease your risk of abdominal aortic aneurysm:  Quitting smoking. Smoking can raise your blood pressure and cause arteriosclerosis.  Limiting or avoiding alcohol.  Keeping your blood pressure, blood sugar level, and cholesterol levels within normal limits.  Decreasing your salt intake. In somepeople, too much salt can raise blood pressure and increase your risk of abdominal aortic aneurysm.  Eating a diet low in saturated fats and cholesterol.  Increasing your fiber intake by including  whole grains, vegetables, and fruits in your diet. Eating these foods may help lower blood pressure.  Maintaining a healthy weight.  Staying physically active and exercising regularly. SYMPTOMS  The symptoms of abdominal aortic aneurysm may vary depending on the size and rate of growth of the aneurysm.Most grow slowly and do not have any symptoms. When symptoms do occur, they may include:  Pain (abdomen, side, lower back, or groin). The pain may vary in intensity. A sudden onset of severe pain may indicate that the aneurysm has ruptured.  Feeling full after eating only small amounts of food.  Nausea or vomiting or both.  Feeling a pulsating lump in the abdomen.  Feeling faint or passing out. DIAGNOSIS  Since most unruptured abdominal aortic aneurysms have no symptoms, they are often discovered during diagnostic exams for other conditions. An aneurysm may be found during the following procedures:  Ultrasonography (A one-time screening for abdominal aortic aneurysm by ultrasonography is also recommended for all men aged 65-75 years who have ever smoked).  X-ray exams.  A computed tomography (CT).  Magnetic resonance imaging (MRI).  Angiography or arteriography. TREATMENT  Treatment of an abdominal aortic aneurysm depends on the size of your aneurysm, your age, and risk factors for rupture. Medication to control blood pressure and pain may be used to manage aneurysms smaller than 6 cm. Regular monitoring for enlargement may be recommended by your caregiver if:  The aneurysm is 3 4 cm in size (an annual ultrasonography may be recommended).  The aneurysm is 4 4.5 cm in size (an ultrasonography every 6 months may be recommended).  The aneurysm is larger than 4.5   cm in size (your caregiver may ask that you be examined by a vascular surgeon). If your aneurysm is larger than 6 cm, surgical repair may be recommended. There are two main methods for repair of an aneurysm:   Endovascular  repair (a minimally invasive surgery). This is done most often.  Open repair. This method is used if an endovascular repair is not possible. Document Released: 10/25/2004 Document Revised: 05/12/2012 Document Reviewed: 02/15/2012 ExitCare Patient Information 2014 [employer].   Smoking Cessation Quitting smoking is important to your health and has many advantages. However, it is not always easy to quit since nicotine is a very addictive drug. Often times, people try 3 times or more before being able to quit. This document explains the best ways for you to prepare to quit smoking. Quitting takes hard work and a lot of effort, but you can do it. ADVANTAGES OF QUITTING SMOKING  You will live longer, feel better, and live better.  Your body will feel the impact of quitting smoking almost immediately.  Within 20 minutes, blood pressure decreases. Your pulse returns to its normal level.  After 8 hours, carbon monoxide levels in the blood return to normal. Your oxygen level increases.  After 24 hours, the chance of having a heart attack starts to decrease. Your breath, hair, and body stop smelling like smoke.  After 48 hours, damaged nerve endings begin to recover. Your sense of taste and smell improve.  After 72 hours, the body is virtually free of nicotine. Your bronchial tubes relax and breathing becomes easier.  After 2 to 12 weeks, lungs can hold more air. Exercise becomes easier and circulation improves.  The risk of having a heart attack, stroke, cancer, or lung disease is greatly reduced.  After 1 year, the risk of coronary heart disease is cut in half.  After 5 years, the risk of stroke falls to the same as a nonsmoker.  After 10 years, the risk of lung cancer is cut in half and the risk of other cancers decreases significantly.  After 15 years, the risk of coronary heart disease drops, usually to the level of a nonsmoker.  If you are pregnant, quitting smoking will improve  your chances of having a healthy baby.  The people you live with, especially any children, will be healthier.  You will have extra money to spend on things other than cigarettes. QUESTIONS TO THINK ABOUT BEFORE ATTEMPTING TO QUIT You may want to talk about your answers with your caregiver.  Why do you want to quit?  If you tried to quit in the past, what helped and what did not?  What will be the most difficult situations for you after you quit? How will you plan to handle them?  Who can help you through the tough times? Your family? Friends? A caregiver?  What pleasures do you get from smoking? What ways can you still get pleasure if you quit? Here are some questions to ask your caregiver:  How can you help me to be successful at quitting?  What medicine do you think would be best for me and how should I take it?  What should I do if I need more help?  What is smoking withdrawal like? How can I get information on withdrawal? GET READY  Set a quit date.  Change your environment by getting rid of all cigarettes, ashtrays, matches, and lighters in your home, car, or work. Do not let people smoke in your home.  Review your   past attempts to quit. Think about what worked and what did not. GET SUPPORT AND ENCOURAGEMENT You have a better chance of being successful if you have help. You can get support in many ways.  Tell your family, friends, and co-workers that you are going to quit and need their support. Ask them not to smoke around you.  Get individual, group, or telephone counseling and support. Programs are available at local hospitals and health centers. Call your local health department for information about programs in your area.  Spiritual beliefs and practices may help some smokers quit.  Download a "quit meter" on your computer to keep track of quit statistics, such as how long you have gone without smoking, cigarettes not smoked, and money saved.  Get a self-help  book about quitting smoking and staying off of tobacco. LEARN NEW SKILLS AND BEHAVIORS  Distract yourself from urges to smoke. Talk to someone, go for a walk, or occupy your time with a task.  Change your normal routine. Take a different route to work. Drink tea instead of coffee. Eat breakfast in a different place.  Reduce your stress. Take a hot bath, exercise, or read a book.  Plan something enjoyable to do every day. Reward yourself for not smoking.  Explore interactive web-based programs that specialize in helping you quit. GET MEDICINE AND USE IT CORRECTLY Medicines can help you stop smoking and decrease the urge to smoke. Combining medicine with the above behavioral methods and support can greatly increase your chances of successfully quitting smoking.  Nicotine replacement therapy helps deliver nicotine to your body without the negative effects and risks of smoking. Nicotine replacement therapy includes nicotine gum, lozenges, inhalers, nasal sprays, and skin patches. Some may be available over-the-counter and others require a prescription.  Antidepressant medicine helps people abstain from smoking, but how this works is unknown. This medicine is available by prescription.  Nicotinic receptor partial agonist medicine simulates the effect of nicotine in your brain. This medicine is available by prescription. Ask your caregiver for advice about which medicines to use and how to use them based on your health history. Your caregiver will tell you what side effects to look out for if you choose to be on a medicine or therapy. Carefully read the information on the package. Do not use any other product containing nicotine while using a nicotine replacement product.  RELAPSE OR DIFFICULT SITUATIONS Most relapses occur within the first 3 months after quitting. Do not be discouraged if you start smoking again. Remember, most people try several times before finally quitting. You may have symptoms  of withdrawal because your body is used to nicotine. You may crave cigarettes, be irritable, feel very hungry, cough often, get headaches, or have difficulty concentrating. The withdrawal symptoms are only temporary. They are strongest when you first quit, but they will go away within 10 14 days. To reduce the chances of relapse, try to:  Avoid drinking alcohol. Drinking lowers your chances of successfully quitting.  Reduce the amount of caffeine you consume. Once you quit smoking, the amount of caffeine in your body increases and can give you symptoms, such as a rapid heartbeat, sweating, and anxiety.  Avoid smokers because they can make you want to smoke.  Do not let weight gain distract you. Many smokers will gain weight when they quit, usually less than 10 pounds. Eat a healthy diet and stay active. You can always lose the weight gained after you quit.  Find ways to improve your   mood other than smoking. FOR MORE INFORMATION  www.smokefree.gov  Document Released: 01/09/2001 Document Revised: 07/17/2011 Document Reviewed: 04/26/2011 ExitCare Patient Information 2014 [employer].  

## 2013-02-05 NOTE — Addendum Note (Signed)
Addended by: Mena Goes on: 02/05/2013 02:51 PM   Modules accepted: Orders

## 2013-06-08 DIAGNOSIS — I714 Abdominal aortic aneurysm, without rupture, unspecified: Secondary | ICD-10-CM | POA: Diagnosis not present

## 2013-06-08 DIAGNOSIS — Z23 Encounter for immunization: Secondary | ICD-10-CM | POA: Diagnosis not present

## 2013-06-08 DIAGNOSIS — Z125 Encounter for screening for malignant neoplasm of prostate: Secondary | ICD-10-CM | POA: Diagnosis not present

## 2013-06-08 DIAGNOSIS — D7589 Other specified diseases of blood and blood-forming organs: Secondary | ICD-10-CM | POA: Diagnosis not present

## 2013-06-08 DIAGNOSIS — I2510 Atherosclerotic heart disease of native coronary artery without angina pectoris: Secondary | ICD-10-CM | POA: Diagnosis not present

## 2013-06-08 DIAGNOSIS — I10 Essential (primary) hypertension: Secondary | ICD-10-CM | POA: Diagnosis not present

## 2013-06-08 DIAGNOSIS — D696 Thrombocytopenia, unspecified: Secondary | ICD-10-CM | POA: Diagnosis not present

## 2013-06-08 DIAGNOSIS — E782 Mixed hyperlipidemia: Secondary | ICD-10-CM | POA: Diagnosis not present

## 2013-06-08 DIAGNOSIS — F17200 Nicotine dependence, unspecified, uncomplicated: Secondary | ICD-10-CM | POA: Diagnosis not present

## 2013-06-08 DIAGNOSIS — Z0000 Encounter for general adult medical examination without abnormal findings: Secondary | ICD-10-CM | POA: Diagnosis not present

## 2013-07-20 DIAGNOSIS — H2510 Age-related nuclear cataract, unspecified eye: Secondary | ICD-10-CM | POA: Diagnosis not present

## 2013-07-20 DIAGNOSIS — H401290 Low-tension glaucoma, unspecified eye, stage unspecified: Secondary | ICD-10-CM | POA: Diagnosis not present

## 2013-07-20 DIAGNOSIS — H409 Unspecified glaucoma: Secondary | ICD-10-CM | POA: Diagnosis not present

## 2013-08-05 ENCOUNTER — Other Ambulatory Visit (HOSPITAL_COMMUNITY): Payer: Medicare Other

## 2013-08-05 ENCOUNTER — Ambulatory Visit: Payer: Medicare Other | Admitting: Family

## 2013-08-10 ENCOUNTER — Encounter: Payer: Self-pay | Admitting: Family

## 2013-08-11 ENCOUNTER — Ambulatory Visit (HOSPITAL_COMMUNITY)
Admission: RE | Admit: 2013-08-11 | Discharge: 2013-08-11 | Disposition: A | Discharge status: Home / Self Care | Payer: Medicare Other | Source: Ambulatory Visit | Attending: Family | Admitting: Family

## 2013-08-11 ENCOUNTER — Encounter: Payer: Self-pay | Admitting: Family

## 2013-08-11 ENCOUNTER — Ambulatory Visit (INDEPENDENT_AMBULATORY_CARE_PROVIDER_SITE_OTHER): Payer: Medicare Other | Admitting: Family

## 2013-08-11 VITALS — BP 132/84 | HR 56 | Resp 16 | Ht 68.0 in | Wt 165.0 lb

## 2013-08-11 DIAGNOSIS — I714 Abdominal aortic aneurysm, without rupture, unspecified: Secondary | ICD-10-CM

## 2013-08-11 NOTE — Progress Notes (Signed)
[facility] [place]  Established Abdominal Aortic Aneurysm  History of Present Illness  Riley Sharp is a 68 y.o. ([birth year]) male male patient of Dr. Oneida Alar followed for known AAA, he returns today for Duplex surveillance.  He exercises 3-4 days/week, rare ETOH consumption.  The patient does have chronic back issues with associated feet numbness, states his PCP is aware.  He reports occasional left lower quadrant abdominal pressure, aggravated by coughing, does jot seem referable to AAA. The patient is a smoker.  The patient denies claudication in legs with walking, denies non-healing wounds.  The patient denies history of stroke or TIA symptoms.  He reports smokers cough MI in 2005, cardiac stent placed.  He has bilateral 4th toes intermittent numbness aggravated by sitting.  He denies any new medical problems or surgeries in the last 6 months.  Pt Diabetic: No  Pt smoker: smoker (age 12 to 2005 cigarettes 1.5 ppd, since then 2 cigars daily)   Past Medical History  Diagnosis Date  . Hypertension   . Hyperlipidemia   . Myocardial infarction 03/2003  . AAA (abdominal aortic aneurysm)   . CAD (coronary artery disease)    Past Surgical History  Procedure Laterality Date  . Appendectomy    . Heart stent  Feb. 14, 2005  . Cholecystectomy  2007    Gall Bladder   Social History History   Social History  . Marital Status: Single    Spouse Name: N/A    Number of Children: N/A  . Years of Education: N/A   Occupational History  . Not on file.   Social History Main Topics  . Smoking status: Current Every Day Smoker -- 2.00 packs/day for 45 years    Types: Cigars  . Smokeless tobacco: Never Used  . Alcohol Use: No     Comment: social drinker  . Drug Use: No  . Sexual Activity: Not on file   Other Topics Concern  . Not on file   Social History Narrative  . No narrative on file   Family History Family History  Problem Relation Age of Onset  .  Hypertension Other   . Heart disease Other   . Cancer Other   . Cancer Father   . Heart disease Father   . Hyperlipidemia Father   . Hypertension Father   . Heart attack Father   . Heart disease Sister   . Hyperlipidemia Sister   . Heart attack Sister     Current Outpatient Prescriptions on File Prior to Visit  Medication Sig Dispense Refill  . aspirin EC 81 MG tablet Take 81 mg by mouth daily.        . brimonidine (ALPHAGAN) 0.2 % ophthalmic solution Place 1 drop into both eyes 2 (two) times daily.        . brinzolamide (AZOPT) 1 % ophthalmic suspension Place 1 drop into the right eye 3 (three) times daily.        Marland Kitchen latanoprost (XALATAN) 0.005 % ophthalmic solution Place 1 drop into the right eye at bedtime.        . metoprolol succinate (TOPROL-XL) 25 MG 24 hr tablet Take 25 mg by mouth daily.        . ramipril (ALTACE) 5 MG tablet Take 5 mg by mouth daily.        . timolol (BETIMOL) 0.5 % ophthalmic solution Place 1 drop into both eyes daily.        . naproxen sodium (ANAPROX) 220  MG tablet Take 220 mg by mouth 2 (two) times daily with a meal.        . Tamsulosin HCl (FLOMAX) 0.4 MG CAPS Take 0.4 mg by mouth daily after supper.         No current facility-administered medications on file prior to visit.   No Known Allergies  ROS: See HPI for pertinent positives and negatives.  Physical Examination  Filed Vitals:   08/11/13 0936  BP: 132/84  Pulse: 56  Resp: 16  Height: 5\' 8"  (1.727 m)  Weight: 165 lb (74.844 kg)   Body mass index is 25.09 kg/(m^2).  General: A&O x 3, WD.  Pulmonary: Sym exp, fiar air movt, CTAB, no rales, rhonchi, or wheezing.  Cardiac: RRR, Nl S1, S2, no Murmurs, rubs or gallops.   Carotid Bruits  Left  Right    Negative  Negative   Aorta is palpable  Radial pulses are 2+ and =   VASCULAR EXAM:  LE Pulses  LEFT  RIGHT   FEMORAL  palpable  palpable   POPLITEAL  not palpable  not palpable   POSTERIOR TIBIAL  palpable  palpable   DORSALIS  PEDIS  ANTERIOR TIBIAL  not palpable  not palpable    Gastrointestinal: soft, NTND, -G/R, - HSM, - masses, - CVAT B.  Musculoskeletal: M/S 5/5 throughout, Extremities without ischemic changes.  Neurologic: CN 2-12 intact, Pain and light touch intact in extremities, Motor exam as listed above.  Non-Invasive Vascular Imaging  AAA Duplex (08/11/2013) ABDOMINAL AORTA DUPLEX EVALUATION    INDICATION: Follow-up abdominal aortic aneurysm     PREVIOUS INTERVENTION(S):     DUPLEX EXAM:     LOCATION DIAMETER AP (cm) DIAMETER TRANSVERSE (cm) VELOCITIES (cm/sec)  Aorta Proximal 3.04 3.00 64  Aorta Mid 4.45 4.50 42  Aorta Distal 3.10 3.12 41  Right Common Iliac Artery 2.06 2.06 29  Left Common Iliac Artery 1.48 1.48 94    Previous max aortic diameter:  4.5 Date: 02/05/2013     ADDITIONAL FINDINGS:     IMPRESSION: Stable abdominal aortic aneurysm with extension into the right common iliac artery. Soft mural thrombus is observed within the aneurysm.    Compared to the previous exam:  No significant change compared to prior exam.     Medical Decision Making  The patient is a 68 y.o. male who presents with asymptomatic AAA with no increase in size. He does inhale his cigars and was counseled re smoking cessation.   Based on this patient's exam and diagnostic studies, and after discussing with Dr. Kellie Simmering the patients intermittent LLQ abdominal pain that does not seem referable to AAA,  the patient will follow up in 6 months  with the following studies: AAA Duplex.  Consideration for repair of AAA would be made when the size approaches 4.8 or 5.0 cm, growth > 1 cm/yr, and symptomatic status.  I emphasized the importance of maximal medical management including strict control of blood pressure, blood glucose, and lipid levels, antiplatelet agents, obtaining regular exercise, and cessation of smoking.   The patient was given information about AAA including signs, symptoms, treatment, and how to  minimize the risk of enlargement and rupture of aneurysms.    The patient was advised to call 911 should the patient experience sudden onset abdominal or back pain.   Thank you for allowing [place] to participate in this patient's care.  Clemon Chambers, RN, MSN, FNP-C [facility] [place] Office[facility] Physician: Kellie Simmering  08/11/2013, 9:45 AM

## 2013-08-11 NOTE — Addendum Note (Signed)
Addended by: Mena Goes on: 08/11/2013 10:38 AM   Modules accepted: Orders

## 2013-08-11 NOTE — Patient Instructions (Signed)
Abdominal Aortic Aneurysm An aneurysm is a weakened or damaged part of an artery wall that bulges from the normal force of blood pumping through the body. An abdominal aortic aneurysm is an aneurysm that occurs in the lower part of the aorta, the main artery of the body.  The major concern with an abdominal aortic aneurysm is that it can enlarge and burst (rupture) or blood can flow between the layers of the wall of the aorta through a tear (aorticdissection). Both of these conditions can cause bleeding inside the body and can be life threatening unless diagnosed and treated promptly. CAUSES  The exact cause of an abdominal aortic aneurysm is unknown. Some contributing factors are:   A hardening of the arteries caused by the buildup of fat and other substances in the lining of a blood vessel (arteriosclerosis).  Inflammation of the walls of an artery (arteritis).   Connective tissue diseases, such as Marfan syndrome.   Abdominal trauma.   An infection, such as syphilis or staphylococcus, in the wall of the aorta (infectious aortitis) caused by bacteria. RISK FACTORS  Risk factors that contribute to an abdominal aortic aneurysm may include:  Age older than 60 years.   High blood pressure (hypertension).  Male gender.  Ethnicity (white race).  Obesity.  Family history of aneurysm (first degree relatives only).  Tobacco use. PREVENTION  The following healthy lifestyle habits may help decrease your risk of abdominal aortic aneurysm:  Quitting smoking. Smoking can raise your blood pressure and cause arteriosclerosis.  Limiting or avoiding alcohol.  Keeping your blood pressure, blood sugar level, and cholesterol levels within normal limits.  Decreasing your salt intake. In somepeople, too much salt can raise blood pressure and increase your risk of abdominal aortic aneurysm.  Eating a diet low in saturated fats and cholesterol.  Increasing your fiber intake by including  whole grains, vegetables, and fruits in your diet. Eating these foods may help lower blood pressure.  Maintaining a healthy weight.  Staying physically active and exercising regularly. SYMPTOMS  The symptoms of abdominal aortic aneurysm may vary depending on the size and rate of growth of the aneurysm.Most grow slowly and do not have any symptoms. When symptoms do occur, they may include:  Pain (abdomen, side, lower back, or groin). The pain may vary in intensity. A sudden onset of severe pain may indicate that the aneurysm has ruptured.  Feeling full after eating only small amounts of food.  Nausea or vomiting or both.  Feeling a pulsating lump in the abdomen.  Feeling faint or passing out. DIAGNOSIS  Since most unruptured abdominal aortic aneurysms have no symptoms, they are often discovered during diagnostic exams for other conditions. An aneurysm may be found during the following procedures:  Ultrasonography (A one-time screening for abdominal aortic aneurysm by ultrasonography is also recommended for all men aged 65-75 years who have ever smoked).  X-ray exams.  A computed tomography (CT).  Magnetic resonance imaging (MRI).  Angiography or arteriography. TREATMENT  Treatment of an abdominal aortic aneurysm depends on the size of your aneurysm, your age, and risk factors for rupture. Medication to control blood pressure and pain may be used to manage aneurysms smaller than 6 cm. Regular monitoring for enlargement may be recommended by your caregiver if:  The aneurysm is 3-4 cm in size (an annual ultrasonography may be recommended).  The aneurysm is 4-4.5 cm in size (an ultrasonography every 6 months may be recommended).  The aneurysm is larger than 4.5 cm in   size (your caregiver may ask that you be examined by a vascular surgeon). If your aneurysm is larger than 6 cm, surgical repair may be recommended. There are two main methods for repair of an aneurysm:   Endovascular  repair (a minimally invasive surgery). This is done most often.  Open repair. This method is used if an endovascular repair is not possible. Document Released: 10/25/2004 Document Revised: 05/12/2012 Document Reviewed: 02/15/2012 [employer] Patient Information 2015 [employer]. This information is not intended to replace advice given to you by your health care provider. Make sure you discuss any questions you have with your health care provider.  

## 2013-08-13 ENCOUNTER — Other Ambulatory Visit (HOSPITAL_COMMUNITY): Payer: Medicare Other

## 2013-08-13 ENCOUNTER — Ambulatory Visit: Payer: Medicare Other | Admitting: Family

## 2013-09-21 DIAGNOSIS — N390 Urinary tract infection, site not specified: Secondary | ICD-10-CM | POA: Diagnosis not present

## 2013-09-21 DIAGNOSIS — R3989 Other symptoms and signs involving the genitourinary system: Secondary | ICD-10-CM | POA: Diagnosis not present

## 2013-11-26 DIAGNOSIS — I714 Abdominal aortic aneurysm, without rupture: Secondary | ICD-10-CM | POA: Diagnosis not present

## 2013-11-26 DIAGNOSIS — E785 Hyperlipidemia, unspecified: Secondary | ICD-10-CM | POA: Diagnosis not present

## 2013-11-26 DIAGNOSIS — Z87891 Personal history of nicotine dependence: Secondary | ICD-10-CM | POA: Diagnosis not present

## 2013-11-26 DIAGNOSIS — I2510 Atherosclerotic heart disease of native coronary artery without angina pectoris: Secondary | ICD-10-CM | POA: Diagnosis not present

## 2013-11-26 DIAGNOSIS — I10 Essential (primary) hypertension: Secondary | ICD-10-CM | POA: Diagnosis not present

## 2014-01-14 DIAGNOSIS — Z23 Encounter for immunization: Secondary | ICD-10-CM | POA: Diagnosis not present

## 2014-01-14 DIAGNOSIS — R079 Chest pain, unspecified: Secondary | ICD-10-CM | POA: Diagnosis not present

## 2014-01-15 DIAGNOSIS — H401212 Low-tension glaucoma, right eye, moderate stage: Secondary | ICD-10-CM | POA: Insufficient documentation

## 2014-01-15 DIAGNOSIS — H401221 Low-tension glaucoma, left eye, mild stage: Secondary | ICD-10-CM | POA: Insufficient documentation

## 2014-01-15 DIAGNOSIS — H2513 Age-related nuclear cataract, bilateral: Secondary | ICD-10-CM | POA: Diagnosis not present

## 2014-02-10 ENCOUNTER — Encounter: Payer: Self-pay | Admitting: Family

## 2014-02-11 ENCOUNTER — Encounter: Payer: Self-pay | Admitting: Family

## 2014-02-11 ENCOUNTER — Ambulatory Visit (HOSPITAL_COMMUNITY)
Admission: RE | Admit: 2014-02-11 | Discharge: 2014-02-11 | Disposition: A | Discharge status: Home / Self Care | Payer: Medicare Other | Source: Ambulatory Visit | Attending: Family | Admitting: Family

## 2014-02-11 ENCOUNTER — Ambulatory Visit (INDEPENDENT_AMBULATORY_CARE_PROVIDER_SITE_OTHER): Payer: Medicare Other | Admitting: Family

## 2014-02-11 VITALS — BP 123/69 | HR 60 | Resp 16 | Ht 68.0 in | Wt 166.0 lb

## 2014-02-11 DIAGNOSIS — I714 Abdominal aortic aneurysm, without rupture, unspecified: Secondary | ICD-10-CM

## 2014-02-11 DIAGNOSIS — F17200 Nicotine dependence, unspecified, uncomplicated: Secondary | ICD-10-CM

## 2014-02-11 DIAGNOSIS — Z720 Tobacco use: Secondary | ICD-10-CM | POA: Diagnosis not present

## 2014-02-11 DIAGNOSIS — F17209 Nicotine dependence, unspecified, with unspecified nicotine-induced disorders: Secondary | ICD-10-CM | POA: Diagnosis not present

## 2014-02-11 NOTE — Patient Instructions (Addendum)
Abdominal Aortic Aneurysm An aneurysm is a weakened or damaged part of an artery wall that bulges from the normal force of blood pumping through the body. An abdominal aortic aneurysm is an aneurysm that occurs in the lower part of the aorta, the main artery of the body.  The major concern with an abdominal aortic aneurysm is that it can enlarge and burst (rupture) or blood can flow between the layers of the wall of the aorta through a tear (aorticdissection). Both of these conditions can cause bleeding inside the body and can be life threatening unless diagnosed and treated promptly. CAUSES  The exact cause of an abdominal aortic aneurysm is unknown. Some contributing factors are:   A hardening of the arteries caused by the buildup of fat and other substances in the lining of a blood vessel (arteriosclerosis).  Inflammation of the walls of an artery (arteritis).   Connective tissue diseases, such as Marfan syndrome.   Abdominal trauma.   An infection, such as syphilis or staphylococcus, in the wall of the aorta (infectious aortitis) caused by bacteria. RISK FACTORS  Risk factors that contribute to an abdominal aortic aneurysm may include:  Age older than 60 years.   High blood pressure (hypertension).  Male gender.  Ethnicity (white race).  Obesity.  Family history of aneurysm (first degree relatives only).  Tobacco use. PREVENTION  The following healthy lifestyle habits may help decrease your risk of abdominal aortic aneurysm:  Quitting smoking. Smoking can raise your blood pressure and cause arteriosclerosis.  Limiting or avoiding alcohol.  Keeping your blood pressure, blood sugar level, and cholesterol levels within normal limits.  Decreasing your salt intake. In somepeople, too much salt can raise blood pressure and increase your risk of abdominal aortic aneurysm.  Eating a diet low in saturated fats and cholesterol.  Increasing your fiber intake by including  whole grains, vegetables, and fruits in your diet. Eating these foods may help lower blood pressure.  Maintaining a healthy weight.  Staying physically active and exercising regularly. SYMPTOMS  The symptoms of abdominal aortic aneurysm may vary depending on the size and rate of growth of the aneurysm.Most grow slowly and do not have any symptoms. When symptoms do occur, they may include:  Pain (abdomen, side, lower back, or groin). The pain may vary in intensity. A sudden onset of severe pain may indicate that the aneurysm has ruptured.  Feeling full after eating only small amounts of food.  Nausea or vomiting or both.  Feeling a pulsating lump in the abdomen.  Feeling faint or passing out. DIAGNOSIS  Since most unruptured abdominal aortic aneurysms have no symptoms, they are often discovered during diagnostic exams for other conditions. An aneurysm may be found during the following procedures:  Ultrasonography (A one-time screening for abdominal aortic aneurysm by ultrasonography is also recommended for all men aged 65-75 years who have ever smoked).  X-ray exams.  A computed tomography (CT).  Magnetic resonance imaging (MRI).  Angiography or arteriography. TREATMENT  Treatment of an abdominal aortic aneurysm depends on the size of your aneurysm, your age, and risk factors for rupture. Medication to control blood pressure and pain may be used to manage aneurysms smaller than 6 cm. Regular monitoring for enlargement may be recommended by your caregiver if:  The aneurysm is 3-4 cm in size (an annual ultrasonography may be recommended).  The aneurysm is 4-4.5 cm in size (an ultrasonography every 6 months may be recommended).  The aneurysm is larger than 4.5 cm in   size (your caregiver may ask that you be examined by a vascular surgeon). If your aneurysm is larger than 6 cm, surgical repair may be recommended. There are two main methods for repair of an aneurysm:   Endovascular  repair (a minimally invasive surgery). This is done most often.  Open repair. This method is used if an endovascular repair is not possible. Document Released: 10/25/2004 Document Revised: 05/12/2012 Document Reviewed: 02/15/2012 [facility] Patient Information 2015 [place], [place]. This information is not intended to replace advice given to you by your health care provider. Make sure you discuss any questions you have with your health care provider.    Smoking Cessation Quitting smoking is important to your health and has many advantages. However, it is not always easy to quit since nicotine is a very addictive drug. Oftentimes, people try 3 times or more before being able to quit. This document explains the best ways for you to prepare to quit smoking. Quitting takes hard work and a lot of effort, but you can do it. ADVANTAGES OF QUITTING SMOKING  You will live longer, feel better, and live better.  Your body will feel the impact of quitting smoking almost immediately.  Within 20 minutes, blood pressure decreases. Your pulse returns to its normal level.  After 8 hours, carbon monoxide levels in the blood return to normal. Your oxygen level increases.  After 24 hours, the chance of having a heart attack starts to decrease. Your breath, hair, and body stop smelling like smoke.  After 48 hours, damaged nerve endings begin to recover. Your sense of taste and smell improve.  After 72 hours, the body is virtually free of nicotine. Your bronchial tubes relax and breathing becomes easier.  After 2 to 12 weeks, lungs can hold more air. Exercise becomes easier and circulation improves.  The risk of having a heart attack, stroke, cancer, or lung disease is greatly reduced.  After 1 year, the risk of coronary heart disease is cut in half.  After 5 years, the risk of stroke falls to the same as a nonsmoker.  After 10 years, the risk of lung cancer is cut in half and the risk of other cancers  decreases significantly.  After 15 years, the risk of coronary heart disease drops, usually to the level of a nonsmoker.  If you are pregnant, quitting smoking will improve your chances of having a healthy baby.  The people you live with, especially any children, will be healthier.  You will have extra money to spend on things other than cigarettes. QUESTIONS TO THINK ABOUT BEFORE ATTEMPTING TO QUIT You may want to talk about your answers with your health care provider.  Why do you want to quit?  If you tried to quit in the past, what helped and what did not?  What will be the most difficult situations for you after you quit? How will you plan to handle them?  Who can help you through the tough times? Your family? Friends? A health care provider?  What pleasures do you get from smoking? What ways can you still get pleasure if you quit? Here are some questions to ask your health care provider:  How can you help me to be successful at quitting?  What medicine do you think would be best for me and how should I take it?  What should I do if I need more help?  What is smoking withdrawal like? How can I get information on withdrawal? GET READY  Set a  quit date.  Change your environment by getting rid of all cigarettes, ashtrays, matches, and lighters in your home, car, or work. Do not let people smoke in your home.  Review your past attempts to quit. Think about what worked and what did not. GET SUPPORT AND ENCOURAGEMENT You have a better chance of being successful if you have help. You can get support in many ways.  Tell your family, friends, and coworkers that you are going to quit and need their support. Ask them not to smoke around you.  Get individual, group, or telephone counseling and support. Programs are available at [employer] and health centers. Call your local health department for information about programs in your area.  Spiritual beliefs and practices may  help some smokers quit.  Download a "quit meter" on your computer to keep track of quit statistics, such as how long you have gone without smoking, cigarettes not smoked, and money saved.  Get a self-help book about quitting smoking and staying off tobacco. [place] yourself from urges to smoke. Talk to someone, go for a walk, or occupy your time with a task.  Change your normal routine. Take a different route to work. Drink tea instead of coffee. Eat breakfast in a different place.  Reduce your stress. Take a hot bath, exercise, or read a book.  Plan something enjoyable to do every day. Reward yourself for not smoking.  Explore interactive web-based programs that specialize in helping you quit. GET MEDICINE AND USE IT CORRECTLY Medicines can help you stop smoking and decrease the urge to smoke. Combining medicine with the above behavioral methods and support can greatly increase your chances of successfully quitting smoking.  Nicotine replacement therapy helps deliver nicotine to your body without the negative effects and risks of smoking. Nicotine replacement therapy includes nicotine gum, lozenges, inhalers, nasal sprays, and skin patches. Some may be available over-the-counter and others require a prescription.  Antidepressant medicine helps people abstain from smoking, but how this works is unknown. This medicine is available by prescription.  Nicotinic receptor partial agonist medicine simulates the effect of nicotine in your brain. This medicine is available by prescription. Ask your health care provider for advice about which medicines to use and how to use them based on your health history. Your health care provider will tell you what side effects to look out for if you choose to be on a medicine or therapy. Carefully read the information on the package. Do not use any other product containing nicotine while using a nicotine replacement product.    RELAPSE OR DIFFICULT SITUATIONS Most relapses occur within the first 3 months after quitting. Do not be discouraged if you start smoking again. Remember, most people try several times before finally quitting. You may have symptoms of withdrawal because your body is used to nicotine. You may crave cigarettes, be irritable, feel very hungry, cough often, get headaches, or have difficulty concentrating. The withdrawal symptoms are only temporary. They are strongest when you first quit, but they will go away within 10-14 days. To reduce the chances of relapse, try to:  Avoid drinking alcohol. Drinking lowers your chances of successfully quitting.  Reduce the amount of caffeine you consume. Once you quit smoking, the amount of caffeine in your body increases and can give you symptoms, such as a rapid heartbeat, sweating, and anxiety.  Avoid smokers because they can make you want to smoke.  Do not let weight gain distract you.  Many smokers will gain weight when they quit, usually less than 10 pounds. Eat a healthy diet and stay active. You can always lose the weight gained after you quit.  Find ways to improve your mood other than smoking. FOR MORE INFORMATION  www.smokefree.gov  Document Released: 01/09/2001 Document Revised: 06/01/2013 Document Reviewed: 04/26/2011 [facility] Patient Information 2015 [place], [place]. This information is not intended to replace advice given to you by your health care provider. Make sure you discuss any questions you have with your health care provider.    Smoking Cessation, Tips for Success If you are ready to quit smoking, congratulations! You have chosen to help yourself be healthier. Cigarettes bring nicotine, tar, carbon monoxide, and other irritants into your body. Your lungs, heart, and blood vessels will be able to work better without these poisons. There are many different ways to quit smoking. Nicotine gum, nicotine patches, a nicotine inhaler, or nicotine  nasal spray can help with physical craving. Hypnosis, support groups, and medicines help break the habit of smoking. WHAT THINGS CAN I DO TO MAKE QUITTING EASIER?  Here are some tips to help you quit for good:  Pick a date when you will quit smoking completely. Tell all of your friends and family about your plan to quit on that date.  Do not try to slowly cut down on the number of cigarettes you are smoking. Pick a quit date and quit smoking completely starting on that day.  Throw away all cigarettes.   Clean and remove all ashtrays from your home, work, and car.  On a card, write down your reasons for quitting. Carry the card with you and read it when you get the urge to smoke.  Cleanse your body of nicotine. Drink enough water and fluids to keep your urine clear or pale yellow. Do this after quitting to flush the nicotine from your body.  Learn to predict your moods. Do not let a bad situation be your excuse to have a cigarette. Some situations in your life might tempt you into wanting a cigarette.  Never have "just one" cigarette. It leads to wanting another and another. Remind yourself of your decision to quit.  Change habits associated with smoking. If you smoked while driving or when feeling stressed, try other activities to replace smoking. Stand up when drinking your coffee. Brush your teeth after eating. Sit in a different chair when you read the paper. Avoid alcohol while trying to quit, and try to drink fewer caffeinated beverages. Alcohol and caffeine may urge you to smoke.  Avoid foods and drinks that can trigger a desire to smoke, such as sugary or spicy foods and alcohol.  Ask people who smoke not to smoke around you.  Have something planned to do right after eating or having a cup of coffee. For example, plan to take a walk or exercise.  Try a relaxation exercise to calm you down and decrease your stress. Remember, you may be tense and nervous for the first 2 weeks after  you quit, but this will pass.  Find new activities to keep your hands busy. Play with a pen, coin, or rubber band. Doodle or draw things on paper.  Brush your teeth right after eating. This will help cut down on the craving for the taste of tobacco after meals. You can also try mouthwash.   Use oral substitutes in place of cigarettes. Try using lemon drops, carrots, cinnamon sticks, or chewing gum. Keep them handy so they are available  when you have the urge to smoke.  When you have the urge to smoke, try deep breathing.  Designate your home as a nonsmoking area.  If you are a heavy smoker, ask your health care provider about a prescription for nicotine chewing gum. It can ease your withdrawal from nicotine.  Reward yourself. Set aside the cigarette money you save and buy yourself something nice.  Look for support from others. Join a support group or smoking cessation program. Ask someone at home or at work to help you with your plan to quit smoking.  Always ask yourself, "Do I need this cigarette or is this just a reflex?" Tell yourself, "Today, I choose not to smoke," or "I do not want to smoke." You are reminding yourself of your decision to quit.  Do not replace cigarette smoking with electronic cigarettes (commonly called e-cigarettes). The safety of e-cigarettes is unknown, and some may contain harmful chemicals.  If you relapse, do not give up! Plan ahead and think about what you will do the next time you get the urge to smoke. HOW WILL I FEEL WHEN I QUIT SMOKING? You may have symptoms of withdrawal because your body is used to nicotine (the addictive substance in cigarettes). You may crave cigarettes, be irritable, feel very hungry, cough often, get headaches, or have difficulty concentrating. The withdrawal symptoms are only temporary. They are strongest when you first quit but will go away within 10-14 days. When withdrawal symptoms occur, stay in control. Think about your reasons  for quitting. Remind yourself that these are signs that your body is healing and getting used to being without cigarettes. Remember that withdrawal symptoms are easier to treat than the major diseases that smoking can cause.  Even after the withdrawal is over, expect periodic urges to smoke. However, these cravings are generally short lived and will go away whether you smoke or not. Do not smoke! WHAT RESOURCES ARE AVAILABLE TO HELP ME QUIT SMOKING? Your health care provider can direct you to community resources or hospitals for support, which may include:  Group support.  Education.  Hypnosis.  Therapy. Document Released: 10/14/2003 Document Revised: 06/01/2013 Document Reviewed: 07/03/2012 [facility] Patient Information 2015 [place], [place]. This information is not intended to replace advice given to you by your health care provider. Make sure you discuss any questions you have with your health care provider.

## 2014-02-11 NOTE — Addendum Note (Signed)
Addended by: Mena Goes on: 02/11/2014 01:15 PM   Modules accepted: Orders

## 2014-02-11 NOTE — Progress Notes (Signed)
[facility] [place]  Established Abdominal Aortic Aneurysm  History of Present Illness  Riley Sharp is a 69 y.o. (1945/10/01) male patient of Dr. Oneida Alar followed for known AAA, he returns today for Duplex surveillance.  He exercises 3-4 days/week, rare ETOH consumption.  The patient does have chronic back issues with associated feet numbness, states his PCP is aware.  The patient is a smoker.  The patient denies claudication in legs with walking, denies non-healing wounds.  The patient denies history of stroke or TIA symptoms.  He reports smokers cough MI in 2005, cardiac stent placed.  He has bilateral 4th toes intermittent numbness aggravated by sitting.  He denies any new medical problems or surgeries in the last 6 months.  Pt Diabetic: No  Pt smoker: smoker (age 1 to 2005 cigarettes 1.5 ppd, since then 2 cigars daily)   Past Medical History  Diagnosis Date  . Hypertension   . Hyperlipidemia   . Myocardial infarction 03/2003  . AAA (abdominal aortic aneurysm)   . CAD (coronary artery disease)    Past Surgical History  Procedure Laterality Date  . Appendectomy    . Heart stent  Feb. 14, 2005  . Cholecystectomy  2007    Gall Bladder   Social History History   Social History  . Marital Status: Single    Spouse Name: N/A    Number of Children: N/A  . Years of Education: N/A   Occupational History  . Not on file.   Social History Main Topics  . Smoking status: Current Every Day Smoker -- 2.00 packs/day for 45 years    Types: Cigars  . Smokeless tobacco: Never Used  . Alcohol Use: No     Comment: social drinker  . Drug Use: No  . Sexual Activity: Not on file   Other Topics Concern  . Not on file   Social History Narrative   Family History Family History  Problem Relation Age of Onset  . Hypertension Other   . Heart disease Other   . Cancer Other   . Cancer Father   . Heart disease Father     After age 68  .  Hyperlipidemia Father   . Hypertension Father   . Heart attack Father   . Heart disease Sister     After age 72  . Hyperlipidemia Sister   . Heart attack Sister     Current Outpatient Prescriptions on File Prior to Visit  Medication Sig Dispense Refill  . aspirin EC 81 MG tablet Take 81 mg by mouth daily.      . brimonidine (ALPHAGAN) 0.2 % ophthalmic solution Place 1 drop into both eyes 2 (two) times daily.      . brinzolamide (AZOPT) 1 % ophthalmic suspension Place 1 drop into the right eye 3 (three) times daily.      Marland Kitchen latanoprost (XALATAN) 0.005 % ophthalmic solution Place 1 drop into the right eye at bedtime.      . metoprolol succinate (TOPROL-XL) 25 MG 24 hr tablet Take 25 mg by mouth daily.      . ramipril (ALTACE) 5 MG tablet Take 5 mg by mouth daily.      . rosuvastatin (CRESTOR) 10 MG tablet Take 10 mg by mouth daily.    . timolol (BETIMOL) 0.5 % ophthalmic solution Place 1 drop into both eyes daily.      . naproxen sodium (ANAPROX) 220 MG tablet Take 220 mg by mouth 2 (two) times daily  with a meal.      . Tamsulosin HCl (FLOMAX) 0.4 MG CAPS Take 0.4 mg by mouth daily after supper.       No current facility-administered medications on file prior to visit.   No Known Allergies  ROS: See HPI for pertinent positives and negatives.  Physical Examination  Filed Vitals:   02/11/14 0909  BP: 123/69  Pulse: 60  Resp: 16  Height: 5\' 8"  (1.727 m)  Weight: 166 lb (75.297 kg)  SpO2: 99%  Body mass index is 25.25 kg/(m^2).   General: A&O x 3, WD.  Pulmonary: Sym exp, fiar air movt, CTAB, no rales, rhonchi, or wheezing. Occasional dry cough. Cardiac: RRR, Nl S1, S2, no Murmur appreciated.    Carotid Bruits  Left  Right    Negative  Negative   Aorta is palpable  Radial pulses are 2+ and =   VASCULAR EXAM:  LE Pulses  LEFT  RIGHT   FEMORAL  palpable  palpable   POPLITEAL  not palpable  not palpable   POSTERIOR TIBIAL  palpable  palpable    DORSALIS PEDIS  ANTERIOR TIBIAL  not palpable  not palpable    Gastrointestinal: soft, NTND, -G/R, - HSM, - masses, - CVAT B.  Musculoskeletal: M/S 5/5 throughout, Extremities without ischemic changes.  Neurologic: CN 2-12 intact, Pain and light touch intact in extremities, Motor exam as listed above.  Non-Invasive Vascular Imaging  AAA Duplex (02/11/2014) ABDOMINAL AORTA DUPLEX EVALUATION    INDICATION: Follow up aortic aneurysm    PREVIOUS INTERVENTION(S): None    DUPLEX EXAM:     LOCATION DIAMETER AP (cm) DIAMETER TRANSVERSE (cm) VELOCITIES (cm/sec)  Aorta Proximal 3.0 - 66  Aorta Mid 4.6 4.4 63  Aorta Distal 2.9 - 33  Right Common Iliac Artery 2.2 2.2 185  Left Common Iliac Artery 1.4 1.4 78    Previous max aortic diameter:  4.4 x 4.4  Date: 08/11/2013  ADDITIONAL FINDINGS: 1.9 cm lumen with plaque noted within aortic aneurysm    IMPRESSION: 1. 4.6 x 4.4 cm mid aortic aneurysm with extension  into the right common iliac artery    Compared to the previous exam:       Medical Decision Making  The patient is a 69 y.o. male who presents with asymptomatic AAA with no increase size. The patient was counseled re smoking cessation and given several free resources re smoking cessation. Face to face time with patient was 15 minutes. Over 50% of this time was spent on counseling and coordination of care.    Based on this patient's exam and diagnostic studies, the patient will follow up in 6 months  with the following studies: AAA Duplex.  Consideration for repair of AAA would be made when the size is 5.5 cm, growth > 1 cm/yr, and symptomatic status.  I emphasized the importance of maximal medical management including strict control of blood pressure, blood glucose, and lipid levels, antiplatelet agents, obtaining regular exercise, and cessation of smoking.   The patient was given information about AAA including signs, symptoms, treatment, and how to minimize the risk of  enlargement and rupture of aneurysms.    The patient was advised to call 911 should the patient experience sudden onset abdominal or back pain.   Thank you for allowing [place] to participate in this patient's care.  Clemon Chambers, RN, MSN, FNP-C [facility] [place] Office: [phone]  Clinic Physician: Oneida Alar  02/11/2014, 9:09 AM

## 2014-06-11 DIAGNOSIS — F17201 Nicotine dependence, unspecified, in remission: Secondary | ICD-10-CM | POA: Diagnosis not present

## 2014-06-11 DIAGNOSIS — D7589 Other specified diseases of blood and blood-forming organs: Secondary | ICD-10-CM | POA: Diagnosis not present

## 2014-06-11 DIAGNOSIS — E782 Mixed hyperlipidemia: Secondary | ICD-10-CM | POA: Diagnosis not present

## 2014-06-11 DIAGNOSIS — I2510 Atherosclerotic heart disease of native coronary artery without angina pectoris: Secondary | ICD-10-CM | POA: Diagnosis not present

## 2014-06-11 DIAGNOSIS — D696 Thrombocytopenia, unspecified: Secondary | ICD-10-CM | POA: Diagnosis not present

## 2014-06-11 DIAGNOSIS — I10 Essential (primary) hypertension: Secondary | ICD-10-CM | POA: Diagnosis not present

## 2014-06-11 DIAGNOSIS — Z0000 Encounter for general adult medical examination without abnormal findings: Secondary | ICD-10-CM | POA: Diagnosis not present

## 2014-06-11 DIAGNOSIS — I714 Abdominal aortic aneurysm, without rupture: Secondary | ICD-10-CM | POA: Diagnosis not present

## 2014-07-26 ENCOUNTER — Other Ambulatory Visit: Payer: Self-pay

## 2014-07-27 DIAGNOSIS — H2513 Age-related nuclear cataract, bilateral: Secondary | ICD-10-CM | POA: Diagnosis not present

## 2014-07-27 DIAGNOSIS — H401212 Low-tension glaucoma, right eye, moderate stage: Secondary | ICD-10-CM | POA: Diagnosis not present

## 2014-07-27 DIAGNOSIS — H401221 Low-tension glaucoma, left eye, mild stage: Secondary | ICD-10-CM | POA: Diagnosis not present

## 2014-08-12 ENCOUNTER — Other Ambulatory Visit (HOSPITAL_COMMUNITY): Payer: Medicare Other

## 2014-08-12 ENCOUNTER — Ambulatory Visit: Payer: Medicare Other | Admitting: Family

## 2014-08-23 ENCOUNTER — Encounter: Payer: Self-pay | Admitting: Family

## 2014-08-26 ENCOUNTER — Ambulatory Visit (HOSPITAL_COMMUNITY)
Admission: RE | Admit: 2014-08-26 | Discharge: 2014-08-26 | Disposition: A | Discharge status: Home / Self Care | Payer: Medicare Other | Source: Ambulatory Visit | Attending: Family | Admitting: Family

## 2014-08-26 ENCOUNTER — Encounter: Payer: Self-pay | Admitting: Family

## 2014-08-26 ENCOUNTER — Ambulatory Visit (INDEPENDENT_AMBULATORY_CARE_PROVIDER_SITE_OTHER): Payer: Medicare Other | Admitting: Family

## 2014-08-26 VITALS — BP 130/69 | HR 57 | Temp 97.5°F | Resp 16 | Ht 68.0 in | Wt 169.0 lb

## 2014-08-26 DIAGNOSIS — I714 Abdominal aortic aneurysm, without rupture, unspecified: Secondary | ICD-10-CM

## 2014-08-26 DIAGNOSIS — Z720 Tobacco use: Secondary | ICD-10-CM | POA: Diagnosis not present

## 2014-08-26 DIAGNOSIS — F17200 Nicotine dependence, unspecified, uncomplicated: Secondary | ICD-10-CM

## 2014-08-26 NOTE — Patient Instructions (Signed)
Abdominal Aortic Aneurysm An aneurysm is a weakened or damaged part of an artery wall that bulges from the normal force of blood pumping through the body. An abdominal aortic aneurysm is an aneurysm that occurs in the lower part of the aorta, the main artery of the body.  The major concern with an abdominal aortic aneurysm is that it can enlarge and burst (rupture) or blood can flow between the layers of the wall of the aorta through a tear (aorticdissection). Both of these conditions can cause bleeding inside the body and can be life threatening unless diagnosed and treated promptly. CAUSES  The exact cause of an abdominal aortic aneurysm is unknown. Some contributing factors are:   A hardening of the arteries caused by the buildup of fat and other substances in the lining of a blood vessel (arteriosclerosis).  Inflammation of the walls of an artery (arteritis).   Connective tissue diseases, such as Marfan syndrome.   Abdominal trauma.   An infection, such as syphilis or staphylococcus, in the wall of the aorta (infectious aortitis) caused by bacteria. RISK FACTORS  Risk factors that contribute to an abdominal aortic aneurysm may include:  Age older than 60 years.   High blood pressure (hypertension).  Male gender.  Ethnicity (white race).  Obesity.  Family history of aneurysm (first degree relatives only).  Tobacco use. PREVENTION  The following healthy lifestyle habits may help decrease your risk of abdominal aortic aneurysm:  Quitting smoking. Smoking can raise your blood pressure and cause arteriosclerosis.  Limiting or avoiding alcohol.  Keeping your blood pressure, blood sugar level, and cholesterol levels within normal limits.  Decreasing your salt intake. In somepeople, too much salt can raise blood pressure and increase your risk of abdominal aortic aneurysm.  Eating a diet low in saturated fats and cholesterol.  Increasing your fiber intake by including  whole grains, vegetables, and fruits in your diet. Eating these foods may help lower blood pressure.  Maintaining a healthy weight.  Staying physically active and exercising regularly. SYMPTOMS  The symptoms of abdominal aortic aneurysm may vary depending on the size and rate of growth of the aneurysm.Most grow slowly and do not have any symptoms. When symptoms do occur, they may include:  Pain (abdomen, side, lower back, or groin). The pain may vary in intensity. A sudden onset of severe pain may indicate that the aneurysm has ruptured.  Feeling full after eating only small amounts of food.  Nausea or vomiting or both.  Feeling a pulsating lump in the abdomen.  Feeling faint or passing out. DIAGNOSIS  Since most unruptured abdominal aortic aneurysms have no symptoms, they are often discovered during diagnostic exams for other conditions. An aneurysm may be found during the following procedures:  Ultrasonography (A one-time screening for abdominal aortic aneurysm by ultrasonography is also recommended for all men aged 65-75 years who have ever smoked).  X-ray exams.  A computed tomography (CT).  Magnetic resonance imaging (MRI).  Angiography or arteriography. TREATMENT  Treatment of an abdominal aortic aneurysm depends on the size of your aneurysm, your age, and risk factors for rupture. Medication to control blood pressure and pain may be used to manage aneurysms smaller than 6 cm. Regular monitoring for enlargement may be recommended by your caregiver if:  The aneurysm is 3-4 cm in size (an annual ultrasonography may be recommended).  The aneurysm is 4-4.5 cm in size (an ultrasonography every 6 months may be recommended).  The aneurysm is larger than 4.5 cm in   size (your caregiver may ask that you be examined by a vascular surgeon). If your aneurysm is larger than 6 cm, surgical repair may be recommended. There are two main methods for repair of an aneurysm:   Endovascular  repair (a minimally invasive surgery). This is done most often.  Open repair. This method is used if an endovascular repair is not possible. Document Released: 10/25/2004 Document Revised: 05/12/2012 Document Reviewed: 02/15/2012 [employer] Patient Information 2015 [employer]. This information is not intended to replace advice given to you by your health care provider. Make sure you discuss any questions you have with your health care provider.  

## 2014-08-26 NOTE — Progress Notes (Signed)
[facility] [place]  Established Abdominal Aortic Aneurysm  History of Present Illness  Riley Sharp is a 69 y.o. ([birth year]) male patient of Dr. Oneida Alar followed for known AAA, he returns today for Duplex surveillance.  He exercises 3-4 days/week, rare ETOH consumption.  The patient does have chronic low back issues, states his PCP is aware.He denies abdominal pain. The patient is a smoker.  The patient denies claudication in legs with walking, denies non-healing wounds.  The patient denies history of stroke or TIA symptoms.  He reports smokers cough MI in 2005, cardiac stent placed.   He denies any new medical problems or surgeries in the last 6 months.  Pt Diabetic: No  Pt smoker: smoker (age 89 to 2005 cigarettes 1.5 ppd, since then 2 cigars daily)   Past Medical History  Diagnosis Date  . Hypertension   . Hyperlipidemia   . Myocardial infarction 03/2003  . AAA (abdominal aortic aneurysm)   . CAD (coronary artery disease)    Past Surgical History  Procedure Laterality Date  . Appendectomy    . Heart stent  Feb. 14, 2005  . Cholecystectomy  2007    Gall Bladder   Social History History   Social History  . Marital Status: Single    Spouse Name: N/A  . Number of Children: N/A  . Years of Education: N/A   Occupational History  . Not on file.   Social History Main Topics  . Smoking status: Current Some Day Smoker -- 2.00 packs/day for 45 years    Types: Cigars  . Smokeless tobacco: Never Used  . Alcohol Use: No     Comment: social drinker  . Drug Use: No  . Sexual Activity: Not on file   Other Topics Concern  . Not on file   Social History Narrative   Family History Family History  Problem Relation Age of Onset  . Hypertension Other   . Heart disease Other   . Cancer Other   . Cancer Father   . Heart disease Father     After age 40  . Hyperlipidemia Father   . Hypertension Father   . Heart attack Father   . Heart  disease Sister     After age 83  . Hyperlipidemia Sister   . Heart attack Sister     Current Outpatient Prescriptions on File Prior to Visit  Medication Sig Dispense Refill  . aspirin EC 81 MG tablet Take 81 mg by mouth daily.      . brimonidine (ALPHAGAN) 0.2 % ophthalmic solution Place 1 drop into both eyes 2 (two) times daily.      . brinzolamide (AZOPT) 1 % ophthalmic suspension Place 1 drop into the right eye 3 (three) times daily.      Marland Kitchen latanoprost (XALATAN) 0.005 % ophthalmic solution Place 1 drop into the right eye at bedtime.      . metoprolol succinate (TOPROL-XL) 25 MG 24 hr tablet Take 25 mg by mouth daily.      . naproxen sodium (ANAPROX) 220 MG tablet Take 220 mg by mouth 2 (two) times daily with a meal.      . ramipril (ALTACE) 5 MG tablet Take 5 mg by mouth daily.      . rosuvastatin (CRESTOR) 10 MG tablet Take 10 mg by mouth daily.    . timolol (BETIMOL) 0.5 % ophthalmic solution Place 1 drop into both eyes daily.      . Tamsulosin HCl (FLOMAX)  0.4 MG CAPS Take 0.4 mg by mouth daily after supper.       No current facility-administered medications on file prior to visit.   No Known Allergies  ROS: See HPI for pertinent positives and negatives.  Physical Examination  Filed Vitals:   08/26/14 0910  BP: 130/69  Pulse: 57  Temp: 97.5 F (36.4 C)  Resp: 16  Height: '5\' 8"'$  (1.727 m)  Weight: 169 lb (76.658 kg)  SpO2: 100%   Body mass index is 25.7 kg/(m^2).  General: A&O x 3, WD.  Pulmonary: Sym exp, fiar air movt, CTAB, no rales, rhonchi, or wheezing. Occasional dry cough. Cardiac: RRR, Nl S1, S2, no Murmur appreciated.   Carotid Bruits  Left  Right    Negative  Negative   Aorta is palpable  Radial pulses are 2+ and =   VASCULAR EXAM:  LE Pulses  LEFT  RIGHT   FEMORAL  palpable  palpable   POPLITEAL  not palpable  not palpable   POSTERIOR TIBIAL  palpable  palpable   DORSALIS PEDIS  ANTERIOR TIBIAL  not  palpable  not palpable    Gastrointestinal: soft, NTND, -G/R, - HSM, - palpable masses, - CVAT B.  Musculoskeletal: M/S 5/5 throughout, Extremities without ischemic changes.  Neurologic: CN 2-12 intact, Pain and light touch intact in extremities, Motor exam as listed above.        11/17/2009 CTA abdomen/pelvis:  Infrarenal abdominal aortic aneurysm. This has a maximal AP diameter of 4.1 cm. 2. Probable small accessory artery to the lower pole of the right kidney. This demonstrates minimal contrast opacification and there is scarring in the lower pole of the right kidney.    Non-Invasive Vascular Imaging  AAA Duplex (08/26/2014)  Previous size: 4.6 cm (Date: 02/11/14)  Current size:  4.86 cm (Date: 08/26/2014)  Medical Decision Making  The patient is a 69 y.o. male who presents with asymptomatic AAA with no significant increase in size.  Five years ago by CTA the maximum diameter was 4.1 cm.   The patient was counseled re smoking cessation and given several free resources re smoking cessation.    Based on this patient's exam and diagnostic studies, the patient will follow up in 6 months  with the following studies: AAA Duplex.  Consideration for repair of AAA would be made when the size is 5.5 cm, growth > 1 cm/yr, and symptomatic status.  I emphasized the importance of maximal medical management including strict control of blood pressure, blood glucose, and lipid levels, antiplatelet agents, obtaining regular exercise, and cessation of smoking.   The patient was given information about AAA including signs, symptoms, treatment, and how to minimize the risk of enlargement and rupture of aneurysms.    The patient was advised to call 911 should the patient experience sudden onset abdominal or back pain.   Thank you for allowing [place] to participate in this patient's care.  Clemon Chambers, RN, MSN, FNP-C [facility] [place] Office:  [facility] Physician: Oneida Alar  08/26/2014, 9:20 AM

## 2014-11-24 DIAGNOSIS — I2510 Atherosclerotic heart disease of native coronary artery without angina pectoris: Secondary | ICD-10-CM | POA: Insufficient documentation

## 2014-12-01 DIAGNOSIS — E785 Hyperlipidemia, unspecified: Secondary | ICD-10-CM | POA: Diagnosis not present

## 2014-12-01 DIAGNOSIS — I2510 Atherosclerotic heart disease of native coronary artery without angina pectoris: Secondary | ICD-10-CM | POA: Diagnosis not present

## 2014-12-01 DIAGNOSIS — I714 Abdominal aortic aneurysm, without rupture: Secondary | ICD-10-CM | POA: Diagnosis not present

## 2014-12-01 DIAGNOSIS — Z6825 Body mass index (BMI) 25.0-25.9, adult: Secondary | ICD-10-CM | POA: Diagnosis not present

## 2014-12-01 DIAGNOSIS — I10 Essential (primary) hypertension: Secondary | ICD-10-CM | POA: Diagnosis not present

## 2015-02-09 DIAGNOSIS — H401221 Low-tension glaucoma, left eye, mild stage: Secondary | ICD-10-CM | POA: Diagnosis not present

## 2015-02-09 DIAGNOSIS — H2513 Age-related nuclear cataract, bilateral: Secondary | ICD-10-CM | POA: Diagnosis not present

## 2015-02-09 DIAGNOSIS — H401212 Low-tension glaucoma, right eye, moderate stage: Secondary | ICD-10-CM | POA: Diagnosis not present

## 2015-02-11 DIAGNOSIS — J441 Chronic obstructive pulmonary disease with (acute) exacerbation: Secondary | ICD-10-CM | POA: Diagnosis not present

## 2015-02-18 ENCOUNTER — Encounter: Payer: Self-pay | Admitting: Family

## 2015-02-21 ENCOUNTER — Encounter: Payer: Self-pay | Admitting: Family

## 2015-02-28 ENCOUNTER — Other Ambulatory Visit: Payer: Self-pay | Admitting: *Deleted

## 2015-02-28 DIAGNOSIS — I714 Abdominal aortic aneurysm, without rupture, unspecified: Secondary | ICD-10-CM

## 2015-03-01 ENCOUNTER — Encounter: Payer: Self-pay | Admitting: Family

## 2015-03-01 ENCOUNTER — Ambulatory Visit (INDEPENDENT_AMBULATORY_CARE_PROVIDER_SITE_OTHER): Payer: Medicare Other | Admitting: Family

## 2015-03-01 ENCOUNTER — Ambulatory Visit (HOSPITAL_COMMUNITY)
Admission: RE | Admit: 2015-03-01 | Discharge: 2015-03-01 | Disposition: A | Discharge status: Home / Self Care | Payer: Medicare Other | Source: Ambulatory Visit | Attending: Family | Admitting: Family

## 2015-03-01 VITALS — BP 117/80 | HR 66 | Ht 68.0 in | Wt 163.6 lb

## 2015-03-01 DIAGNOSIS — I714 Abdominal aortic aneurysm, without rupture, unspecified: Secondary | ICD-10-CM

## 2015-03-01 DIAGNOSIS — F17200 Nicotine dependence, unspecified, uncomplicated: Secondary | ICD-10-CM | POA: Diagnosis not present

## 2015-03-01 DIAGNOSIS — E785 Hyperlipidemia, unspecified: Secondary | ICD-10-CM | POA: Insufficient documentation

## 2015-03-01 DIAGNOSIS — I10 Essential (primary) hypertension: Secondary | ICD-10-CM | POA: Diagnosis not present

## 2015-03-01 NOTE — Progress Notes (Signed)
[facility] [place]  Established Abdominal Aortic Aneurysm  History of Present Illness  Riley Sharp is a 70 y.o. (Mar 11, 1945) male patient of Dr. Oneida Alar followed for known AAA, he returns today for Duplex surveillance.  He exercises 2 days/week, rare ETOH consumption.  The patient does have chronic low back issues, states his PCP is aware.He denies abdominal pain. The patient is a smoker.  The patient denies claudication in legs with walking, denies non-healing wounds.  The patient denies history of stroke or TIA symptoms.  He reports smokers cough MI in 2005, cardiac stent placed.   He denies any new medical problems or surgeries in the last 6 months.  He has had an URI with chronic cough about 3 weeks, has been treated for this, feels improved.  Pt Diabetic: No  Pt smoker: smoker (age 53 to 2005 cigarettes 1.5 ppd, since then 3 cigars daily)    Past Medical History  Diagnosis Date  . Hypertension   . Hyperlipidemia   . Myocardial infarction ([place]) 03/2003  . AAA (abdominal aortic aneurysm) ([place])   . CAD (coronary artery disease)    Past Surgical History  Procedure Laterality Date  . Appendectomy    . Heart stent  Feb. 14, 2005  . Cholecystectomy  2007    Gall Bladder   Social History Social History   Social History  . Marital Status: Single    Spouse Name: N/A  . Number of Children: N/A  . Years of Education: N/A   Occupational History  . Not on file.   Social History Main Topics  . Smoking status: Current Some Day Smoker -- 2.00 packs/day for 45 years    Types: Cigars  . Smokeless tobacco: Never Used  . Alcohol Use: No     Comment: social drinker  . Drug Use: No  . Sexual Activity: Not on file   Other Topics Concern  . Not on file   Social History Narrative   Family History Family History  Problem Relation Age of Onset  . Hypertension Other   . Heart disease Other   . Cancer Other   . Cancer Father   . Heart  disease Father     After age 74  . Hyperlipidemia Father   . Hypertension Father   . Heart attack Father   . Heart disease Sister     After age 48  . Hyperlipidemia Sister   . Heart attack Sister     Current Outpatient Prescriptions on File Prior to Visit  Medication Sig Dispense Refill  . aspirin EC 81 MG tablet Take 81 mg by mouth daily.      . brimonidine (ALPHAGAN) 0.2 % ophthalmic solution Place 1 drop into both eyes 2 (two) times daily.      . brinzolamide (AZOPT) 1 % ophthalmic suspension Place 1 drop into the right eye 3 (three) times daily.      Marland Kitchen latanoprost (XALATAN) 0.005 % ophthalmic solution Place 1 drop into the right eye at bedtime.      . metoprolol succinate (TOPROL-XL) 25 MG 24 hr tablet Take 25 mg by mouth daily.      . ramipril (ALTACE) 5 MG tablet Take 5 mg by mouth daily.      . rosuvastatin (CRESTOR) 10 MG tablet Take 10 mg by mouth daily.    . Tamsulosin HCl (FLOMAX) 0.4 MG CAPS Take 0.4 mg by mouth daily after supper.      . timolol (BETIMOL) 0.5 %  ophthalmic solution Place 1 drop into both eyes daily.      . naproxen sodium (ANAPROX) 220 MG tablet Take 220 mg by mouth 2 (two) times daily with a meal. Reported on 03/01/2015     No current facility-administered medications on file prior to visit.   No Known Allergies  ROS: See HPI for pertinent positives and negatives.  Physical Examination  Filed Vitals:   03/01/15 1018  BP: 117/80  Pulse: 66  Height: '5\' 8"'$  (1.727 m)  Weight: 163 lb 9.6 oz (74.208 kg)  SpO2: 98%   Body mass index is 24.88 kg/(m^2). General: A&O x 3, WD.  Pulmonary: Sym exp, fair air movt, CTAB, no rales, rhonchi, or wheezing. Occasional dry cough. Cardiac: RRR, Nl S1, S2, no murmur appreciated.   Carotid Bruits  Left  Right    Negative  Negative   Aorta is palpable  Radial pulses are 2+ and =   VASCULAR EXAM:  LE Pulses  LEFT  RIGHT   FEMORAL  palpable  palpable   POPLITEAL   not palpable  not palpable   POSTERIOR TIBIAL  palpable  palpable   DORSALIS PEDIS  ANTERIOR TIBIAL  not palpable  not palpable    Gastrointestinal: soft, NTND, -G/R, - HSM, - palpable masses, - CVAT B.  Musculoskeletal: M/S 5/5 throughout, Extremities without ischemic changes.  Neurologic: CN 2-12 intact, Pain and light touch intact in extremities, Motor exam as listed above.               Non-Invasive Vascular Imaging  AAA Duplex (03/01/2015) ABDOMINAL AORTA DUPLEX EVALUATION    INDICATION: Evaluation of abdominal aorta.    PREVIOUS INTERVENTION(S):     DUPLEX EXAM:     LOCATION DIAMETER AP (cm) DIAMETER TRANSVERSE (cm) VELOCITIES (cm/sec)  Aorta Proximal 2.86 2.79 31  Aorta Mid 4.84 4.86 35  Aorta Distal 3.40  35  Right Common Iliac Artery 2.69 2.69 97  Left Common Iliac Artery 1.63 1.76 93    Previous max aortic diameter:  4.86 x 4.80 Date: 08/26/2014     ADDITIONAL FINDINGS:     IMPRESSION: Patent abdominal aortic aneurysm measuring approximately 4.84 x 4.86 cm in diameter with intraluminal thrombus noted.    Compared to the previous exam:  No significant change in comparison to the last exam on 08/26/2014.     Medical Decision Making  The patient is a 70 y.o. male who presents with asymptomatic AAA with no increase in size. Unfortunately he continues to smoke. The patient was counseled re smoking cessation and given several free resources re smoking cessation.   Based on this patient's exam and diagnostic studies, the patient will follow up in 6 months  with the following studies: AAA duplex.  Consideration for repair of AAA would be made when the size is 5.5 cm, growth > 1 cm/yr, and symptomatic status.  I emphasized the importance of maximal medical management including strict control of blood pressure, blood glucose, and lipid levels, antiplatelet agents, obtaining regular exercise, and cessation of smoking.   The patient  was given information about AAA including signs, symptoms, treatment, and how to minimize the risk of enlargement and rupture of aneurysms.    The patient was advised to call 911 should the patient experience sudden onset abdominal or back pain.   Thank you for allowing [place] to participate in this patient's care.  Clemon Chambers, RN, MSN, FNP-C [facility] [place] Office: [phone]  Clinic Physician: Early  03/01/2015,  10:30 AM

## 2015-03-01 NOTE — Patient Instructions (Addendum)
Abdominal Aortic Aneurysm An aneurysm is a weakened or damaged part of an artery wall that bulges from the normal force of blood pumping through the body. An abdominal aortic aneurysm is an aneurysm that occurs in the lower part of the aorta, the main artery of the body.  The major concern with an abdominal aortic aneurysm is that it can enlarge and burst (rupture) or blood can flow between the layers of the wall of the aorta through a tear (aorticdissection). Both of these conditions can cause bleeding inside the body and can be life threatening unless diagnosed and treated promptly. CAUSES  The exact cause of an abdominal aortic aneurysm is unknown. Some contributing factors are:   A hardening of the arteries caused by the buildup of fat and other substances in the lining of a blood vessel (arteriosclerosis).  Inflammation of the walls of an artery (arteritis).   Connective tissue diseases, such as Marfan syndrome.   Abdominal trauma.   An infection, such as syphilis or staphylococcus, in the wall of the aorta (infectious aortitis) caused by bacteria. RISK FACTORS  Risk factors that contribute to an abdominal aortic aneurysm may include:  Age older than 60 years.   High blood pressure (hypertension).  Male gender.  Ethnicity (white race).  Obesity.  Family history of aneurysm (first degree relatives only).  Tobacco use. PREVENTION  The following healthy lifestyle habits may help decrease your risk of abdominal aortic aneurysm:  Quitting smoking. Smoking can raise your blood pressure and cause arteriosclerosis.  Limiting or avoiding alcohol.  Keeping your blood pressure, blood sugar level, and cholesterol levels within normal limits.  Decreasing your salt intake. In somepeople, too much salt can raise blood pressure and increase your risk of abdominal aortic aneurysm.  Eating a diet low in saturated fats and cholesterol.  Increasing your fiber intake by including  whole grains, vegetables, and fruits in your diet. Eating these foods may help lower blood pressure.  Maintaining a healthy weight.  Staying physically active and exercising regularly. SYMPTOMS  The symptoms of abdominal aortic aneurysm may vary depending on the size and rate of growth of the aneurysm.Most grow slowly and do not have any symptoms. When symptoms do occur, they may include:  Pain (abdomen, side, lower back, or groin). The pain may vary in intensity. A sudden onset of severe pain may indicate that the aneurysm has ruptured.  Feeling full after eating only small amounts of food.  Nausea or vomiting or both.  Feeling a pulsating lump in the abdomen.  Feeling faint or passing out. DIAGNOSIS  Since most unruptured abdominal aortic aneurysms have no symptoms, they are often discovered during diagnostic exams for other conditions. An aneurysm may be found during the following procedures:  Ultrasonography (A one-time screening for abdominal aortic aneurysm by ultrasonography is also recommended for all men aged 65-75 years who have ever smoked).  X-ray exams.  A computed tomography (CT).  Magnetic resonance imaging (MRI).  Angiography or arteriography. TREATMENT  Treatment of an abdominal aortic aneurysm depends on the size of your aneurysm, your age, and risk factors for rupture. Medication to control blood pressure and pain may be used to manage aneurysms smaller than 6 cm. Regular monitoring for enlargement may be recommended by your caregiver if:  The aneurysm is 3-4 cm in size (an annual ultrasonography may be recommended).  The aneurysm is 4-4.5 cm in size (an ultrasonography every 6 months may be recommended).  The aneurysm is larger than 4.5 cm in   size (your caregiver may ask that you be examined by a vascular surgeon). If your aneurysm is larger than 6 cm, surgical repair may be recommended. There are two main methods for repair of an aneurysm:   Endovascular  repair (a minimally invasive surgery). This is done most often.  Open repair. This method is used if an endovascular repair is not possible.   This information is not intended to replace advice given to you by your health care provider. Make sure you discuss any questions you have with your health care provider.   Document Released: 10/25/2004 Document Revised: 05/12/2012 Document Reviewed: 02/15/2012 Elsevier Interactive Patient Education 2016 [employer].    Smoking Cessation, Tips for Success If you are ready to quit smoking, congratulations! You have chosen to help yourself be healthier. Cigarettes bring nicotine, tar, carbon monoxide, and other irritants into your body. Your lungs, heart, and blood vessels will be able to work better without these poisons. There are many different ways to quit smoking. Nicotine gum, nicotine patches, a nicotine inhaler, or nicotine nasal spray can help with physical craving. Hypnosis, support groups, and medicines help break the habit of smoking. WHAT THINGS CAN I DO TO MAKE QUITTING EASIER?  Here are some tips to help you quit for good:  Pick a date when you will quit smoking completely. Tell all of your friends and family about your plan to quit on that date.  Do not try to slowly cut down on the number of cigarettes you are smoking. Pick a quit date and quit smoking completely starting on that day.  Throw away all cigarettes.   Clean and remove all ashtrays from your home, work, and car.  On a card, write down your reasons for quitting. Carry the card with you and read it when you get the urge to smoke.  Cleanse your body of nicotine. Drink enough water and fluids to keep your urine clear or pale yellow. Do this after quitting to flush the nicotine from your body.  Learn to predict your moods. Do not let a bad situation be your excuse to have a cigarette. Some situations in your life might tempt you into wanting a cigarette.  Never have "just  one" cigarette. It leads to wanting another and another. Remind yourself of your decision to quit.  Change habits associated with smoking. If you smoked while driving or when feeling stressed, try other activities to replace smoking. Stand up when drinking your coffee. Brush your teeth after eating. Sit in a different chair when you read the paper. Avoid alcohol while trying to quit, and try to drink fewer caffeinated beverages. Alcohol and caffeine may urge you to smoke.  Avoid foods and drinks that can trigger a desire to smoke, such as sugary or spicy foods and alcohol.  Ask people who smoke not to smoke around you.  Have something planned to do right after eating or having a cup of coffee. For example, plan to take a walk or exercise.  Try a relaxation exercise to calm you down and decrease your stress. Remember, you may be tense and nervous for the first 2 weeks after you quit, but this will pass.  Find new activities to keep your hands busy. Play with a pen, coin, or rubber band. Doodle or draw things on paper.  Brush your teeth right after eating. This will help cut down on the craving for the taste of tobacco after meals. You can also try mouthwash.   Use oral  substitutes in place of cigarettes. Try using lemon drops, carrots, cinnamon sticks, or chewing gum. Keep them handy so they are available when you have the urge to smoke.  When you have the urge to smoke, try deep breathing.  Designate your home as a nonsmoking area.  If you are a heavy smoker, ask your health care provider about a prescription for nicotine chewing gum. It can ease your withdrawal from nicotine.  Reward yourself. Set aside the cigarette money you save and buy yourself something nice.  Look for support from others. Join a support group or smoking cessation program. Ask someone at home or at work to help you with your plan to quit smoking.  Always ask yourself, "Do I need this cigarette or is this just a  reflex?" Tell yourself, "Today, I choose not to smoke," or "I do not want to smoke." You are reminding yourself of your decision to quit.  Do not replace cigarette smoking with electronic cigarettes (commonly called e-cigarettes). The safety of e-cigarettes is unknown, and some may contain harmful chemicals.  If you relapse, do not give up! Plan ahead and think about what you will do the next time you get the urge to smoke. HOW WILL I FEEL WHEN I QUIT SMOKING? You may have symptoms of withdrawal because your body is used to nicotine (the addictive substance in cigarettes). You may crave cigarettes, be irritable, feel very hungry, cough often, get headaches, or have difficulty concentrating. The withdrawal symptoms are only temporary. They are strongest when you first quit but will go away within 10-14 days. When withdrawal symptoms occur, stay in control. Think about your reasons for quitting. Remind yourself that these are signs that your body is healing and getting used to being without cigarettes. Remember that withdrawal symptoms are easier to treat than the major diseases that smoking can cause.  Even after the withdrawal is over, expect periodic urges to smoke. However, these cravings are generally short lived and will go away whether you smoke or not. Do not smoke! WHAT RESOURCES ARE AVAILABLE TO HELP ME QUIT SMOKING? Your health care provider can direct you to community resources or hospitals for support, which may include:  Group support.  Education.  Hypnosis.  Therapy.   This information is not intended to replace advice given to you by your health care provider. Make sure you discuss any questions you have with your health care provider.   Document Released: 10/14/2003 Document Revised: 02/05/2014 Document Reviewed: 07/03/2012 Elsevier Interactive Patient Education 2016 [employer].    Steps to Quit Smoking  Smoking tobacco can be harmful to your health and can affect almost  every organ in your body. Smoking puts you, and those around you, at risk for developing many serious chronic diseases. Quitting smoking is difficult, but it is one of the best things that you can do for your health. It is never too late to quit. WHAT ARE THE BENEFITS OF QUITTING SMOKING? When you quit smoking, you lower your risk of developing serious diseases and conditions, such as:  Lung cancer or lung disease, such as COPD.  Heart disease.  Stroke.  Heart attack.  Infertility.  Osteoporosis and bone fractures. Additionally, symptoms such as coughing, wheezing, and shortness of breath may get better when you quit. You may also find that you get sick less often because your body is stronger at fighting off colds and infections. If you are pregnant, quitting smoking can help to reduce your chances of having a  baby of low birth weight. HOW DO I GET READY TO QUIT? When you decide to quit smoking, create a plan to make sure that you are successful. Before you quit:  Pick a date to quit. Set a date within the next two weeks to give you time to prepare.  Write down the reasons why you are quitting. Keep this list in places where you will see it often, such as on your bathroom mirror or in your car or wallet.  Identify the people, places, things, and activities that make you want to smoke (triggers) and avoid them. Make sure to take these actions:  Throw away all cigarettes at home, at work, and in your car.  Throw away smoking accessories, such as Scientist, research (medical).  Clean your car and make sure to empty the ashtray.  Clean your home, including curtains and carpets.  Tell your family, friends, and coworkers that you are quitting. Support from your loved ones can make quitting easier.  Talk with your health care provider about your options for quitting smoking.  Find out what treatment options are covered by your health insurance. WHAT STRATEGIES CAN I USE TO QUIT SMOKING?    Talk with your healthcare provider about different strategies to quit smoking. Some strategies include:  Quitting smoking altogether instead of gradually lessening how much you smoke over a period of time. Research shows that quitting "cold [place]" is more successful than gradually quitting.  Attending in-person counseling to help you build problem-solving skills. You are more likely to have success in quitting if you attend several counseling sessions. Even short sessions of 10 minutes can be effective.  Finding resources and support systems that can help you to quit smoking and remain smoke-free after you quit. These resources are most helpful when you use them often. They can include:  Online chats with a Social worker.  Telephone quitlines.  Printed Furniture conservator/restorer.  Support groups or group counseling.  Text messaging programs.  Mobile phone applications.  Taking medicines to help you quit smoking. (If you are pregnant or breastfeeding, talk with your health care provider first.) Some medicines contain nicotine and some do not. Both types of medicines help with cravings, but the medicines that include nicotine help to relieve withdrawal symptoms. Your health care provider may recommend:  Nicotine patches, gum, or lozenges.  Nicotine inhalers or sprays.  Non-nicotine medicine that is taken by mouth. Talk with your health care provider about combining strategies, such as taking medicines while you are also receiving in-person counseling. Using these two strategies together makes you more likely to succeed in quitting than if you used either strategy on its own. If you are pregnant or breastfeeding, talk with your health care provider about finding counseling or other support strategies to quit smoking. Do not take medicine to help you quit smoking unless told to do so by your health care provider. WHAT THINGS CAN I DO TO MAKE IT EASIER TO QUIT? Quitting smoking might feel  overwhelming at first, but there is a lot that you can do to make it easier. Take these important actions:  Reach out to your family and friends and ask that they support and encourage you during this time. Call telephone quitlines, reach out to support groups, or work with a counselor for support.  Ask people who smoke to avoid smoking around you.  Avoid places that trigger you to smoke, such as bars, parties, or smoke-break areas at work.  Spend time around people who do  not smoke.  Lessen stress in your life, because stress can be a smoking trigger for some people. To lessen stress, try:  Exercising regularly.  Deep-breathing exercises.  Yoga.  Meditating.  Performing a body scan. This involves closing your eyes, scanning your body from head to toe, and noticing which parts of your body are particularly tense. Purposefully relax the muscles in those areas.  Download or purchase mobile phone or tablet apps (applications) that can help you stick to your quit plan by providing reminders, tips, and encouragement. There are many free apps, such as QuitGuide from the [employer] Office manager for Disease Control and Prevention). You can find other support for quitting smoking (smoking cessation) through smokefree.gov and other websites. HOW WILL I FEEL WHEN I QUIT SMOKING? Within the first 24 hours of quitting smoking, you may start to feel some withdrawal symptoms. These symptoms are usually most noticeable 2-3 days after quitting, but they usually do not last beyond 2-3 weeks. Changes or symptoms that you might experience include:  Mood swings.  Restlessness, anxiety, or irritation.  Difficulty concentrating.  Dizziness.  Strong cravings for sugary foods in addition to nicotine.  Mild weight gain.  Constipation.  Nausea.  Coughing or a sore throat.  Changes in how your medicines work in your body.  A depressed mood.  Difficulty sleeping (insomnia). After the first 2-3 weeks of  quitting, you may start to notice more positive results, such as:  Improved sense of smell and taste.  Decreased coughing and sore throat.  Slower heart rate.  Lower blood pressure.  Clearer skin.  The ability to breathe more easily.  Fewer sick days. Quitting smoking is very challenging for most people. Do not get discouraged if you are not successful the first time. Some people need to make many attempts to quit before they achieve long-term success. Do your best to stick to your quit plan, and talk with your health care provider if you have any questions or concerns.   This information is not intended to replace advice given to you by your health care provider. Make sure you discuss any questions you have with your health care provider.   Document Released: 01/09/2001 Document Revised: 06/01/2014 Document Reviewed: 06/01/2014 Elsevier Interactive Patient Education [employer].

## 2015-03-02 DIAGNOSIS — Z23 Encounter for immunization: Secondary | ICD-10-CM | POA: Diagnosis not present

## 2015-03-02 DIAGNOSIS — R0981 Nasal congestion: Secondary | ICD-10-CM | POA: Diagnosis not present

## 2015-03-02 DIAGNOSIS — R432 Parageusia: Secondary | ICD-10-CM | POA: Diagnosis not present

## 2015-03-02 NOTE — Addendum Note (Signed)
Addended by: Thresa Ross C on: 03/02/2015 11:17 AM   Modules accepted: Orders

## 2015-03-03 ENCOUNTER — Ambulatory Visit: Payer: BLUE CROSS/BLUE SHIELD | Admitting: Family

## 2015-03-03 ENCOUNTER — Other Ambulatory Visit (HOSPITAL_COMMUNITY): Payer: BLUE CROSS/BLUE SHIELD

## 2015-05-24 DIAGNOSIS — L281 Prurigo nodularis: Secondary | ICD-10-CM | POA: Diagnosis not present

## 2015-05-24 DIAGNOSIS — L570 Actinic keratosis: Secondary | ICD-10-CM | POA: Diagnosis not present

## 2015-05-24 DIAGNOSIS — L821 Other seborrheic keratosis: Secondary | ICD-10-CM | POA: Diagnosis not present

## 2015-06-15 DIAGNOSIS — N400 Enlarged prostate without lower urinary tract symptoms: Secondary | ICD-10-CM | POA: Diagnosis not present

## 2015-06-15 DIAGNOSIS — Z0000 Encounter for general adult medical examination without abnormal findings: Secondary | ICD-10-CM | POA: Diagnosis not present

## 2015-06-15 DIAGNOSIS — I714 Abdominal aortic aneurysm, without rupture: Secondary | ICD-10-CM | POA: Diagnosis not present

## 2015-06-15 DIAGNOSIS — E782 Mixed hyperlipidemia: Secondary | ICD-10-CM | POA: Diagnosis not present

## 2015-06-15 DIAGNOSIS — F17201 Nicotine dependence, unspecified, in remission: Secondary | ICD-10-CM | POA: Diagnosis not present

## 2015-06-15 DIAGNOSIS — I10 Essential (primary) hypertension: Secondary | ICD-10-CM | POA: Diagnosis not present

## 2015-06-15 DIAGNOSIS — I2510 Atherosclerotic heart disease of native coronary artery without angina pectoris: Secondary | ICD-10-CM | POA: Diagnosis not present

## 2015-08-18 DIAGNOSIS — H2513 Age-related nuclear cataract, bilateral: Secondary | ICD-10-CM | POA: Diagnosis not present

## 2015-08-18 DIAGNOSIS — H401221 Low-tension glaucoma, left eye, mild stage: Secondary | ICD-10-CM | POA: Diagnosis not present

## 2015-08-18 DIAGNOSIS — H401212 Low-tension glaucoma, right eye, moderate stage: Secondary | ICD-10-CM | POA: Diagnosis not present

## 2015-08-26 ENCOUNTER — Encounter: Payer: Self-pay | Admitting: Family

## 2015-09-01 ENCOUNTER — Ambulatory Visit (HOSPITAL_COMMUNITY)
Admission: RE | Admit: 2015-09-01 | Discharge: 2015-09-01 | Disposition: A | Discharge status: Home / Self Care | Payer: Medicare Other | Source: Ambulatory Visit | Attending: Family | Admitting: Family

## 2015-09-01 ENCOUNTER — Other Ambulatory Visit: Payer: Self-pay | Admitting: *Deleted

## 2015-09-01 ENCOUNTER — Ambulatory Visit (INDEPENDENT_AMBULATORY_CARE_PROVIDER_SITE_OTHER): Payer: Medicare Other | Admitting: Family

## 2015-09-01 ENCOUNTER — Encounter: Payer: Self-pay | Admitting: Family

## 2015-09-01 VITALS — BP 115/76 | HR 57 | Resp 18 | Ht 68.0 in | Wt 168.5 lb

## 2015-09-01 DIAGNOSIS — F17200 Nicotine dependence, unspecified, uncomplicated: Secondary | ICD-10-CM

## 2015-09-01 DIAGNOSIS — I2510 Atherosclerotic heart disease of native coronary artery without angina pectoris: Secondary | ICD-10-CM | POA: Insufficient documentation

## 2015-09-01 DIAGNOSIS — I714 Abdominal aortic aneurysm, without rupture, unspecified: Secondary | ICD-10-CM

## 2015-09-01 DIAGNOSIS — E785 Hyperlipidemia, unspecified: Secondary | ICD-10-CM | POA: Insufficient documentation

## 2015-09-01 DIAGNOSIS — R0989 Other specified symptoms and signs involving the circulatory and respiratory systems: Secondary | ICD-10-CM

## 2015-09-01 DIAGNOSIS — I10 Essential (primary) hypertension: Secondary | ICD-10-CM | POA: Diagnosis not present

## 2015-09-01 DIAGNOSIS — Z01810 Encounter for preprocedural cardiovascular examination: Secondary | ICD-10-CM

## 2015-09-01 NOTE — Progress Notes (Signed)
[facility] [place]   CC: Follow up Abdominal Aortic Aneurysm  History of Present Illness  Riley Sharp is a 70 y.o. (1946/01/17) male patient of Dr. Oneida Alar followed for known AAA, he returns today for Duplex surveillance.  He exercises 2 days/week, rare ETOH consumption.  The patient does have chronic low back issues, states his PCP is aware.He denies abdominal pain. The patient is a smoker.  The patient denies claudication in legs with walking, denies non-healing wounds.  The patient denies history of stroke or TIA symptoms.  He reports smokers cough MI in 2005, cardiac stent placed.   He denies any new medical problems or surgeries in the last 6 months.  Pt Diabetic: No  Pt smoker: smoker (age 64 to 2005 cigarettes 1.5 ppd, since then 3 cigars daily)    Past Medical History:  Diagnosis Date  . AAA (abdominal aortic aneurysm) ([place])   . CAD (coronary artery disease)   . Hyperlipidemia   . Hypertension   . Myocardial infarction [facility]) 03/2003   Past Surgical History:  Procedure Laterality Date  . APPENDECTOMY    . CHOLECYSTECTOMY  2007   Gall Bladder  . heart stent  Feb. 14, 2005   Social History Social History   Social History  . Marital status: Single    Spouse name: N/A  . Number of children: N/A  . Years of education: N/A   Occupational History  . Not on file.   Social History Main Topics  . Smoking status: Current Some Day Smoker    Packs/day: 2.00    Years: 45.00    Types: Cigars  . Smokeless tobacco: Never Used  . Alcohol use No     Comment: social drinker  . Drug use: No  . Sexual activity: Not on file   Other Topics Concern  . Not on file   Social History Narrative  . No narrative on file   Family History Family History  Problem Relation Age of Onset  . Cancer Father   . Heart disease Father     After age 75  . Hyperlipidemia Father   . Hypertension Father   . Heart attack Father   . Heart disease Sister      After age 17  . Hyperlipidemia Sister   . Heart attack Sister   . Hypertension Other   . Heart disease Other   . Cancer Other     Current Outpatient Prescriptions on File Prior to Visit  Medication Sig Dispense Refill  . aspirin EC 81 MG tablet Take 81 mg by mouth daily.      . brimonidine (ALPHAGAN) 0.2 % ophthalmic solution Place 1 drop into both eyes 2 (two) times daily.      . brinzolamide (AZOPT) 1 % ophthalmic suspension Place 1 drop into the right eye 3 (three) times daily.      Marland Kitchen latanoprost (XALATAN) 0.005 % ophthalmic solution Place 1 drop into the right eye at bedtime.      . metoprolol succinate (TOPROL-XL) 25 MG 24 hr tablet Take 25 mg by mouth daily.      . ramipril (ALTACE) 5 MG tablet Take 5 mg by mouth daily.      . rosuvastatin (CRESTOR) 10 MG tablet Take 10 mg by mouth daily.    . timolol (BETIMOL) 0.5 % ophthalmic solution Place 1 drop into both eyes daily.      . naproxen sodium (ANAPROX) 220 MG tablet Take 220 mg by mouth  2 (two) times daily with a meal. Reported on 03/01/2015    . Tamsulosin HCl (FLOMAX) 0.4 MG CAPS Take 0.4 mg by mouth daily after supper.       No current facility-administered medications on file prior to visit.    No Known Allergies  ROS: See HPI for pertinent positives and negatives.  Physical Examination  Vitals:   09/01/15 0913  BP: 115/76  Pulse: (!) 57  Resp: 18  SpO2: 99%  Weight: 168 lb 8 oz (76.4 kg)  Height: '5\' 8"'$  (1.727 m)   Body mass index is 25.62 kg/m.  General: A&O x 3, WD.  Pulmonary: Sym exp, respirations are non labored, fair air movt, CTAB, no rales, rhonchi, or wheezing. Occasional moist cough. Cardiac: RRR, Nl S1, S2, no murmur appreciated.   Carotid Bruits  Left  Right    Negative  Negative   Aorta is palpable  Radial pulses are 2+ and =   VASCULAR EXAM:  LE Pulses  LEFT  RIGHT   FEMORAL  palpable  palpable   POPLITEAL  2+ palpable  2+ palpable    POSTERIOR TIBIAL  2+palpable  2+palpable   DORSALIS PEDIS  ANTERIOR TIBIAL  not palpable  not palpable    Gastrointestinal: soft, NTND, -G/R, - HSM, - palpable masses, - CVAT B.  Musculoskeletal: M/S 5/5 throughout, Extremities without ischemic changes.  Neurologic: CN 2-12 intact, Pain and light touch intact in extremities, Motor exam as listed above.   Non-Invasive Vascular Imaging  AAA Duplex (09/01/2015)  Previous size: 4.86 cm (Date: 03/01/15)  Current size:  5.3 cm (Date: 09/01/15), limited visualization due to bowel gas and body habitus  Medical Decision Making  The patient is a 70 y.o. male who presents with asymptomatic AAA with increase in size from 4.86 cm in January 2017 to 5.3 cm today, with limited visualization due to bowel gas and body habitus. Will schedule CTA abd/pelvis within the next 2 weeks, see Dr. Oneida Alar after this for discussion and options to address.  Will consider checking popliteal arteries by duplex in the future. His popliteal arteries are 2+ palpable today, were not palpable on previous exams.   His primary risk factor for aneurysmal growth is his cigar smoking after he quit 41 years of cigarette smoking in 2005. He was counseled re smoking cessation and given several free resources re smoking cessation.  His blood pressure remains in good control.   Consideration for repair of AAA would be made when the size is 5.5 cm, growth > 1 cm/yr, and symptomatic status.  I emphasized the importance of maximal medical management including strict control of blood pressure, blood glucose, and lipid levels, antiplatelet agents, obtaining regular exercise, and  cessation of smoking.   The patient was given information about AAA including signs, symptoms, treatment, and how to minimize the risk of enlargement and rupture of aneurysms.    The patient was advised to call 911 should the patient experience sudden onset abdominal or back pain.    Thank you for allowing [place] to participate in this patient's care.  Clemon Chambers, RN, MSN, FNP-C [facility] [place] Office: [facility] Physician: Oneida Alar  09/01/2015, 9:16 AM

## 2015-09-01 NOTE — Patient Instructions (Signed)
Abdominal Aortic Aneurysm An aneurysm is a weakened or damaged part of an artery wall that bulges from the normal force of blood pumping through the body. An abdominal aortic aneurysm is an aneurysm that occurs in the lower part of the aorta, the main artery of the body.  The major concern with an abdominal aortic aneurysm is that it can enlarge and burst (rupture) or blood can flow between the layers of the wall of the aorta through a tear (aorticdissection). Both of these conditions can cause bleeding inside the body and can be life threatening unless diagnosed and treated promptly. CAUSES  The exact cause of an abdominal aortic aneurysm is unknown. Some contributing factors are:   A hardening of the arteries caused by the buildup of fat and other substances in the lining of a blood vessel (arteriosclerosis).  Inflammation of the walls of an artery (arteritis).   Connective tissue diseases, such as Marfan syndrome.   Abdominal trauma.   An infection, such as syphilis or staphylococcus, in the wall of the aorta (infectious aortitis) caused by bacteria. RISK FACTORS  Risk factors that contribute to an abdominal aortic aneurysm may include:  Age older than 60 years.   High blood pressure (hypertension).  Male gender.  Ethnicity (white race).  Obesity.  Family history of aneurysm (first degree relatives only).  Tobacco use. PREVENTION  The following healthy lifestyle habits may help decrease your risk of abdominal aortic aneurysm:  Quitting smoking. Smoking can raise your blood pressure and cause arteriosclerosis.  Limiting or avoiding alcohol.  Keeping your blood pressure, blood sugar level, and cholesterol levels within normal limits.  Decreasing your salt intake. In somepeople, too much salt can raise blood pressure and increase your risk of abdominal aortic aneurysm.  Eating a diet low in saturated fats and cholesterol.  Increasing your fiber intake by including  whole grains, vegetables, and fruits in your diet. Eating these foods may help lower blood pressure.  Maintaining a healthy weight.  Staying physically active and exercising regularly. SYMPTOMS  The symptoms of abdominal aortic aneurysm may vary depending on the size and rate of growth of the aneurysm.Most grow slowly and do not have any symptoms. When symptoms do occur, they may include:  Pain (abdomen, side, lower back, or groin). The pain may vary in intensity. A sudden onset of severe pain may indicate that the aneurysm has ruptured.  Feeling full after eating only small amounts of food.  Nausea or vomiting or both.  Feeling a pulsating lump in the abdomen.  Feeling faint or passing out. DIAGNOSIS  Since most unruptured abdominal aortic aneurysms have no symptoms, they are often discovered during diagnostic exams for other conditions. An aneurysm may be found during the following procedures:  Ultrasonography (A one-time screening for abdominal aortic aneurysm by ultrasonography is also recommended for all men aged 65-75 years who have ever smoked).  X-ray exams.  A computed tomography (CT).  Magnetic resonance imaging (MRI).  Angiography or arteriography. TREATMENT  Treatment of an abdominal aortic aneurysm depends on the size of your aneurysm, your age, and risk factors for rupture. Medication to control blood pressure and pain may be used to manage aneurysms smaller than 6 cm. Regular monitoring for enlargement may be recommended by your caregiver if:  The aneurysm is 3-4 cm in size (an annual ultrasonography may be recommended).  The aneurysm is 4-4.5 cm in size (an ultrasonography every 6 months may be recommended).  The aneurysm is larger than 4.5 cm in   size (your caregiver may ask that you be examined by a vascular surgeon). If your aneurysm is larger than 6 cm, surgical repair may be recommended. There are two main methods for repair of an aneurysm:   Endovascular  repair (a minimally invasive surgery). This is done most often.  Open repair. This method is used if an endovascular repair is not possible.   This information is not intended to replace advice given to you by your health care provider. Make sure you discuss any questions you have with your health care provider.   Document Released: 10/25/2004 Document Revised: 05/12/2012 Document Reviewed: 02/15/2012 Elsevier Interactive Patient Education 2016 [employer].     Steps to Quit Smoking  Smoking tobacco can be harmful to your health and can affect almost every organ in your body. Smoking puts you, and those around you, at risk for developing many serious chronic diseases. Quitting smoking is difficult, but it is one of the best things that you can do for your health. It is never too late to quit. WHAT ARE THE BENEFITS OF QUITTING SMOKING? When you quit smoking, you lower your risk of developing serious diseases and conditions, such as:  Lung cancer or lung disease, such as COPD.  Heart disease.  Stroke.  Heart attack.  Infertility.  Osteoporosis and bone fractures. Additionally, symptoms such as coughing, wheezing, and shortness of breath may get better when you quit. You may also find that you get sick less often because your body is stronger at fighting off colds and infections. If you are pregnant, quitting smoking can help to reduce your chances of having a baby of low birth weight. HOW DO I GET READY TO QUIT? When you decide to quit smoking, create a plan to make sure that you are successful. Before you quit:  Pick a date to quit. Set a date within the next two weeks to give you time to prepare.  Write down the reasons why you are quitting. Keep this list in places where you will see it often, such as on your bathroom mirror or in your car or wallet.  Identify the people, places, things, and activities that make you want to smoke (triggers) and avoid them. Make sure to take  these actions:  Throw away all cigarettes at home, at work, and in your car.  Throw away smoking accessories, such as Scientist, research (medical).  Clean your car and make sure to empty the ashtray.  Clean your home, including curtains and carpets.  Tell your family, friends, and coworkers that you are quitting. Support from your loved ones can make quitting easier.  Talk with your health care provider about your options for quitting smoking.  Find out what treatment options are covered by your health insurance. WHAT STRATEGIES CAN I USE TO QUIT SMOKING?  Talk with your healthcare provider about different strategies to quit smoking. Some strategies include:  Quitting smoking altogether instead of gradually lessening how much you smoke over a period of time. Research shows that quitting "cold [place]" is more successful than gradually quitting.  Attending in-person counseling to help you build problem-solving skills. You are more likely to have success in quitting if you attend several counseling sessions. Even short sessions of 10 minutes can be effective.  Finding resources and support systems that can help you to quit smoking and remain smoke-free after you quit. These resources are most helpful when you use them often. They can include:  Online chats with a Social worker.  Telephone  quitlines.  Printed Furniture conservator/restorer.  Support groups or group counseling.  Text messaging programs.  Mobile phone applications.  Taking medicines to help you quit smoking. (If you are pregnant or breastfeeding, talk with your health care provider first.) Some medicines contain nicotine and some do not. Both types of medicines help with cravings, but the medicines that include nicotine help to relieve withdrawal symptoms. Your health care provider may recommend:  Nicotine patches, gum, or lozenges.  Nicotine inhalers or sprays.  Non-nicotine medicine that is taken by mouth. Talk with your health care  provider about combining strategies, such as taking medicines while you are also receiving in-person counseling. Using these two strategies together makes you more likely to succeed in quitting than if you used either strategy on its own. If you are pregnant or breastfeeding, talk with your health care provider about finding counseling or other support strategies to quit smoking. Do not take medicine to help you quit smoking unless told to do so by your health care provider. WHAT THINGS CAN I DO TO MAKE IT EASIER TO QUIT? Quitting smoking might feel overwhelming at first, but there is a lot that you can do to make it easier. Take these important actions:  Reach out to your family and friends and ask that they support and encourage you during this time. Call telephone quitlines, reach out to support groups, or work with a counselor for support.  Ask people who smoke to avoid smoking around you.  Avoid places that trigger you to smoke, such as bars, parties, or smoke-break areas at work.  Spend time around people who do not smoke.  Lessen stress in your life, because stress can be a smoking trigger for some people. To lessen stress, try:  Exercising regularly.  Deep-breathing exercises.  Yoga.  Meditating.  Performing a body scan. This involves closing your eyes, scanning your body from head to toe, and noticing which parts of your body are particularly tense. Purposefully relax the muscles in those areas.  Download or purchase mobile phone or tablet apps (applications) that can help you stick to your quit plan by providing reminders, tips, and encouragement. There are many free apps, such as QuitGuide from the [employer] Office manager for Disease Control and Prevention). You can find other support for quitting smoking (smoking cessation) through smokefree.gov and other websites. HOW WILL I FEEL WHEN I QUIT SMOKING? Within the first 24 hours of quitting smoking, you may start to feel some withdrawal  symptoms. These symptoms are usually most noticeable 2-3 days after quitting, but they usually do not last beyond 2-3 weeks. Changes or symptoms that you might experience include:  Mood swings.  Restlessness, anxiety, or irritation.  Difficulty concentrating.  Dizziness.  Strong cravings for sugary foods in addition to nicotine.  Mild weight gain.  Constipation.  Nausea.  Coughing or a sore throat.  Changes in how your medicines work in your body.  A depressed mood.  Difficulty sleeping (insomnia). After the first 2-3 weeks of quitting, you may start to notice more positive results, such as:  Improved sense of smell and taste.  Decreased coughing and sore throat.  Slower heart rate.  Lower blood pressure.  Clearer skin.  The ability to breathe more easily.  Fewer sick days. Quitting smoking is very challenging for most people. Do not get discouraged if you are not successful the first time. Some people need to make many attempts to quit before they achieve long-term success. Do your best to stick to  your quit plan, and talk with your health care provider if you have any questions or concerns.   This information is not intended to replace advice given to you by your health care provider. Make sure you discuss any questions you have with your health care provider.   Document Released: 01/09/2001 Document Revised: 06/01/2014 Document Reviewed: 06/01/2014 Elsevier Interactive Patient Education [employer].

## 2015-09-13 ENCOUNTER — Encounter: Payer: Self-pay | Admitting: Vascular Surgery

## 2015-09-15 ENCOUNTER — Ambulatory Visit (INDEPENDENT_AMBULATORY_CARE_PROVIDER_SITE_OTHER): Payer: Medicare Other | Admitting: Vascular Surgery

## 2015-09-15 ENCOUNTER — Encounter: Payer: Self-pay | Admitting: Vascular Surgery

## 2015-09-15 ENCOUNTER — Ambulatory Visit
Admission: RE | Admit: 2015-09-15 | Discharge: 2015-09-15 | Disposition: A | Discharge status: Home / Self Care | Payer: Medicare Other | Source: Ambulatory Visit | Attending: Family | Admitting: Family

## 2015-09-15 VITALS — BP 112/78 | HR 76 | Ht 68.0 in | Wt 170.0 lb

## 2015-09-15 DIAGNOSIS — I714 Abdominal aortic aneurysm, without rupture, unspecified: Secondary | ICD-10-CM

## 2015-09-15 DIAGNOSIS — Z01810 Encounter for preprocedural cardiovascular examination: Secondary | ICD-10-CM

## 2015-09-15 MED ORDER — IOPAMIDOL (ISOVUE-370) INJECTION 76%
100.0000 mL | Freq: Once | INTRAVENOUS | Status: AC | PRN
  Administered 2015-09-15: 100 mL via INTRAVENOUS

## 2015-09-15 NOTE — Progress Notes (Signed)
Patient name: Riley Sharp MRN: [id number] DOB: December 13, 1945 Sex: male    HPI: Riley Sharp is a 70 y.o. male,  Who has been followed for a AAA since 2012.   He was last seen by our NP Vinnie Level Nickel on 09/01/2015 at that time it was discovered that his AAA ultrasound showed an increase in size to 5.3 cm diameter from the previous 4.86  In 7 months time.  He reports no symptoms of abdominal or sever lumbar pain.   Other medical problems include HTN managed with Metoprolol, CAD s/p stent after MI in 2005, and Glaucoma.  He takes a daily 81 mg aspirin.    Past Medical History:  Diagnosis Date  . AAA (abdominal aortic aneurysm) ([place])   . CAD (coronary artery disease)   . Hyperlipidemia   . Hypertension   . Myocardial infarction [facility]) 03/2003   Past Surgical History:  Procedure Laterality Date  . APPENDECTOMY    . CHOLECYSTECTOMY  2007   Gall Bladder  . heart stent  Feb. 14, 2005    Family History  Problem Relation Age of Onset  . Cancer Father   . Heart disease Father     After age 33  . Hyperlipidemia Father   . Hypertension Father   . Heart attack Father   . Heart disease Sister     After age 63  . Hyperlipidemia Sister   . Heart attack Sister   . Hypertension Other   . Heart disease Other   . Cancer Other     SOCIAL HISTORY: Social History   Social History  . Marital status: Single    Spouse name: N/A  . Number of children: N/A  . Years of education: N/A   Occupational History  . Not on file.   Social History Main Topics  . Smoking status: Current Some Day Smoker    Packs/day: 2.00    Years: 45.00    Types: Cigars  . Smokeless tobacco: Never Used  . Alcohol use No     Comment: social drinker  . Drug use: No  . Sexual activity: Not on file   Other Topics Concern  . Not on file   Social History Narrative  . No narrative on file    No Known Allergies  Current Outpatient Prescriptions  Medication Sig Dispense Refill  . aspirin EC 81 MG tablet  Take 81 mg by mouth daily.      . brimonidine (ALPHAGAN) 0.2 % ophthalmic solution Place 1 drop into both eyes 2 (two) times daily.      . brinzolamide (AZOPT) 1 % ophthalmic suspension Place 1 drop into the right eye 3 (three) times daily.      Marland Kitchen latanoprost (XALATAN) 0.005 % ophthalmic solution Place 1 drop into the right eye at bedtime.      . metoprolol succinate (TOPROL-XL) 25 MG 24 hr tablet Take 25 mg by mouth daily.      . ramipril (ALTACE) 5 MG tablet Take 5 mg by mouth daily.      . rosuvastatin (CRESTOR) 10 MG tablet Take 10 mg by mouth daily.    . timolol (BETIMOL) 0.5 % ophthalmic solution Place 1 drop into both eyes daily.       No current facility-administered medications for this visit.     ROS:   General:  No weight loss, Fever, chills  HEENT: No recent headaches, no nasal bleeding, no visual changes, no sore throat  Neurologic:  No dizziness, blackouts, seizures. No recent symptoms of stroke or mini- stroke. No recent episodes of slurred speech, or temporary blindness.  Cardiac: No recent episodes of chest pain/pressure, no shortness of breath at rest.  No shortness of breath with exertion.  Denies history of atrial fibrillation or irregular heartbeat  Vascular: No history of rest pain in feet.  No history of claudication.  No history of non-healing ulcer, No history of DVT   Pulmonary: No home oxygen, no productive cough, no hemoptysis,  No asthma or wheezing  Musculoskeletal:  '[ ]'$  Arthritis, '[ ]'$  Low back pain,  '[ ]'$  Joint pain  Hematologic:No history of hypercoagulable state.  No history of easy bleeding.  No history of anemia  Gastrointestinal: No hematochezia or melena,  No gastroesophageal reflux, no trouble swallowing  Urinary: '[ ]'$  chronic Kidney disease, '[ ]'$  on HD - '[ ]'$  MWF or '[ ]'$  TTHS, '[ ]'$  Burning with urination, '[ ]'$  Frequent urination, '[ ]'$  Difficulty urinating;   Skin: No rashes  Psychological: No history of anxiety,  No history of depression   Physical  Examination  Vitals:   09/15/15 1308  BP: 112/78  Pulse: 76  SpO2: 100%  Weight: 170 lb (77.1 kg)  Height: '5\' 8"'$  (1.727 m)    Body mass index is 25.85 kg/m.  General:  Alert and oriented, no acute distress HEENT: Normal Neck: No bruit or JVD Pulmonary: Clear to auscultation bilaterally Cardiac: Regular Rate and Rhythm without murmur Abdomen: Soft, non-tender, non-distended, no mass, no scars Skin: No rash Extremity Pulses:  2+ radial, brachial, femoral, absent dorsalis pedis, 2+ posterior tibial pulses bilaterally Musculoskeletal: No deformity or edema  Neurologic: Upper and lower extremity motor 5/5 and symmetric  DATA:  AAA Duplex (09/01/2015)  Previous size: 4.86 cm (Date: 03/01/15)  Current size:  5.3 cm (Date: 09/01/15), limited visualization due to bowel gas and body habitus CTA Abd/pelvis Was reviewed by Dr. Oneida Alar shows 5.3 cm diameter AAA  ASSESSMENT:   AAA without rupture increased size in last 7 months to 5.3 Asymptomatic.   PLAN:   After reviewing the CTA with Dr. Oneida Alar he is recommending EVAR procedure either in the near future or we can watch it for 6 more months.   The patient has decided to more forward with an EVAR procedure to repair the AAA.  He will f/u in September for final scheduling.     COLLINS, EMMA MAUREEN PA-C [facility]  The patient was seen in conjunction with Dr. Oneida Alar today.  History and exam findings as above. Patient has had aneurysm growth over several years. I believe it is of the size now should consider repair. He should be a candidate for percutaneous Gore Excluder stent graft. He does have some tortuosity of the left external iliac artery but I believe the device should track around this. We will have the [employer] make measurements off his recent CT scan.  He will also need cardiac risk stratification preoperatively by Dr. Faylene Million his cardiologist in [facility].  The patient had several  events coming up in August and September and wished to defer repair of his aneurysm until October. He will follow-up with me in September to make sure that nothing has changed in his overall history.  Ruta Hinds, MD [facility] Office: [phone] Pager: [phone]

## 2015-09-19 DIAGNOSIS — J209 Acute bronchitis, unspecified: Secondary | ICD-10-CM | POA: Diagnosis not present

## 2015-11-01 ENCOUNTER — Encounter: Payer: Self-pay | Admitting: Vascular Surgery

## 2015-11-03 ENCOUNTER — Other Ambulatory Visit: Payer: Self-pay

## 2015-11-03 ENCOUNTER — Ambulatory Visit (INDEPENDENT_AMBULATORY_CARE_PROVIDER_SITE_OTHER): Payer: Medicare Other | Admitting: Vascular Surgery

## 2015-11-03 ENCOUNTER — Encounter: Payer: Self-pay | Admitting: Vascular Surgery

## 2015-11-03 VITALS — BP 103/68 | HR 61 | Temp 98.8°F | Resp 20 | Ht 68.0 in | Wt 171.0 lb

## 2015-11-03 DIAGNOSIS — Z23 Encounter for immunization: Secondary | ICD-10-CM | POA: Diagnosis not present

## 2015-11-03 DIAGNOSIS — I714 Abdominal aortic aneurysm, without rupture, unspecified: Secondary | ICD-10-CM

## 2015-11-03 NOTE — Progress Notes (Signed)
Patient name: Riley Sharp  MRN: [id number]        DOB: 27-Jul-1945          Sex: male       HPI: Riley Sharp is a 70 y.o. male,  Who has been followed for a AAA since 2012.  His AAA ultrasound showed an increase in size to 5.3 cm diameter from the previous 4.86  In 7 months time.  He reports no symptoms of abdominal or sever lumbar pain.   Other medical problems include HTN managed with Metoprolol, CAD s/p stent after MI in 2005, and Glaucoma.  He takes a daily 81 mg aspirin.         Past Medical History:  Diagnosis Date  . AAA (abdominal aortic aneurysm) ([place])    . CAD (coronary artery disease)    . Hyperlipidemia    . Hypertension    . Myocardial infarction [facility]) 03/2003         Past Surgical History:  Procedure Laterality Date  . APPENDECTOMY      . CHOLECYSTECTOMY   2007    Gall Bladder  . heart stent   Feb. 14, 2005            Family History  Problem Relation Age of Onset  . Cancer Father    . Heart disease Father        After age 51  . Hyperlipidemia Father    . Hypertension Father    . Heart attack Father    . Heart disease Sister        After age 70  . Hyperlipidemia Sister    . Heart attack Sister    . Hypertension Other    . Heart disease Other    . Cancer Other        SOCIAL HISTORY: Social History    Social History  . Marital status: Single      Spouse name: N/A  . Number of children: N/A  . Years of education: N/A       Occupational History  . Not on file.          Social History Main Topics  . Smoking status: Current Some Day Smoker      Packs/day: 2.00      Years: 45.00      Types: Cigars  . Smokeless tobacco: Never Used  . Alcohol use No        Comment: social drinker  . Drug use: No  . Sexual activity: Not on file    Other Topics Concern  . Not on file       Social History Narrative  . No narrative on file      No Known Allergies         Current Outpatient Prescriptions  Medication Sig Dispense Refill  . aspirin EC 81  MG tablet Take 81 mg by mouth daily.        . brimonidine (ALPHAGAN) 0.2 % ophthalmic solution Place 1 drop into both eyes 2 (two) times daily.        . brinzolamide (AZOPT) 1 % ophthalmic suspension Place 1 drop into the right eye 3 (three) times daily.        Marland Kitchen latanoprost (XALATAN) 0.005 % ophthalmic solution Place 1 drop into the right eye at bedtime.        . metoprolol succinate (TOPROL-XL) 25 MG 24 hr tablet Take 25 mg by mouth daily.        Marland Kitchen  ramipril (ALTACE) 5 MG tablet Take 5 mg by mouth daily.        . rosuvastatin (CRESTOR) 10 MG tablet Take 10 mg by mouth daily.      . timolol (BETIMOL) 0.5 % ophthalmic solution Place 1 drop into both eyes daily.          No current facility-administered medications for this visit.       ROS:    General:  No weight loss, Fever, chills   HEENT: No recent headaches, no nasal bleeding, no visual changes, no sore throat   Neurologic: No dizziness, blackouts, seizures. No recent symptoms of stroke or mini- stroke. No recent episodes of slurred speech, or temporary blindness.   Cardiac: No recent episodes of chest pain/pressure, no shortness of breath at rest.  No shortness of breath with exertion.  Denies history of atrial fibrillation or irregular heartbeat   Vascular: No history of rest pain in feet.  No history of claudication.  No history of non-healing ulcer, No history of DVT    Pulmonary: No home oxygen, no productive cough, no hemoptysis,  No asthma or wheezing   Musculoskeletal:  '[ ]'$  Arthritis, '[ ]'$  Low back pain,  '[ ]'$  Joint pain   Hematologic:No history of hypercoagulable state.  No history of easy bleeding.  No history of anemia   Gastrointestinal: No hematochezia or melena,  No gastroesophageal reflux, no trouble swallowing   Urinary: '[ ]'$  chronic Kidney disease, '[ ]'$  on HD - '[ ]'$  MWF or '[ ]'$  TTHS, '[ ]'$  Burning with urination, '[ ]'$  Frequent urination, '[ ]'$  Difficulty urinating;    Skin: No rashes   Psychological: No history of  anxiety,  No history of depression     Physical Examination    Vitals:   11/03/15 0913  BP: 103/68  Pulse: 61  Resp: 20  Temp: 98.8 F (37.1 C)  TempSrc: Oral  SpO2: 96%  Weight: 171 lb (77.6 kg)  Height: '5\' 8"'$  (1.727 m)     General:  Alert and oriented, no acute distress HEENT: Normal Neck: No bruit or JVD Pulmonary: Clear to auscultation bilaterally Cardiac: Regular Rate and Rhythm without murmur Abdomen: Soft, non-tender, non-distended, no mass, no scars Skin: No rash Extremity Pulses:  2+ radial, brachial, femoral, absent dorsalis pedis, 2+ posterior tibial pulses bilaterally Musculoskeletal: No deformity or edema      Neurologic: Upper and lower extremity motor 5/5 and symmetric   DATA:  AAA Duplex (09/01/2015)  Previous size: 4.86 cm (Date: 03/01/15)  Current size:  5.3 cm (Date: 09/01/15), limited visualization due to bowel gas and body habitus  CTA Abd/pelvis 5.3 cm diameter AAA, Read stenosis of right internal iliac artery, right common iliac measures 2.4 cm in diameter with a large amount of thrombus left common iliac is 17-18 mm. Aortic neck length is 3 cm with diameter of 29 mm   ASSESSMENT:   AAA without rupture increased size in last 7 months to 5.3 Asymptomatic.     PLAN:   Patient has had aneurysm growth over several years. I believe it is of the size now should consider repair. He should be a candidate for percutaneous Gore Excluder stent graft. He does have some tortuosity of the left external iliac artery but I believe the device should track around this. Plan will be to either embolize the right internal iliac artery and extending into the external or potentially a branch device although I am more skeptical of a branch device due to  the high-grade stenosis of his internal iliac.   He will also need cardiac risk stratification preoperatively by Dr. Faylene Million his cardiologist in [place].   Procedure is scheduled for 11/14/2015.   risks benefits  possible complications were explained the patient today including not limited to bleeding infection myocardial events possible transfusion possible need for cut down on the femoral arteries.  Ruta Hinds, MD [facility] [place] Office: [phone] Pager: [phone]

## 2015-11-08 NOTE — Pre-Procedure Instructions (Signed)
    GEAN LAROSE  11/08/2015      [id number]Phone: [phone]: [phone]   Your procedure is scheduled on Monday, November 14, 2015   [facility]  Call this number if you have problems the morning of surgery:  [phone]   Remember:  Do not eat food or drink liquids after midnight Sunday, November 13, 2015  Take these medicines the morning of surgery with A SIP OF WATER : aspirin, metoprolol succinate (TOPROL-XL), eye drops Stop taking vitamins, fish oil and herbal medications. Do not take any NSAIDs ie: Ibuprofen, Advil, Naproxen, BC and Goody Powder  Do not wear jewelry, make-up or nail polish.  Do not wear lotions, powders, or perfumes, or deoderant.  Do not shave 48 hours prior to surgery.  Men may shave face and neck.  Do not bring valuables to the hospital.  [facility] belongings or [facility]ables.  Contacts, dentures or bridgework may not be worn into surgery.  Leave your suitcase in the car.  After surgery it may be brought to your room. For patients admitted to the hospital, discharge time will be determined by your treatment team. Patients discharged the day of surgery will not be allowed to drive home.  Special instructions: Shower the night before surgery and the morning of surgery with CHG. Please read over the following fact sheets that you were given. Pain Booklet, Coughing and Deep Breathing, Blood Transfusion Information, MRSA Information and Surgical Site Infection Prevention

## 2015-11-09 ENCOUNTER — Encounter (HOSPITAL_COMMUNITY)
Admission: RE | Admit: 2015-11-09 | Discharge: 2015-11-09 | Disposition: A | Discharge status: Home / Self Care | Payer: Medicare Other | Source: Ambulatory Visit | Attending: Vascular Surgery | Admitting: Vascular Surgery

## 2015-11-09 ENCOUNTER — Encounter (HOSPITAL_COMMUNITY): Payer: Self-pay

## 2015-11-09 DIAGNOSIS — Z7982 Long term (current) use of aspirin: Secondary | ICD-10-CM | POA: Diagnosis not present

## 2015-11-09 DIAGNOSIS — I2510 Atherosclerotic heart disease of native coronary artery without angina pectoris: Secondary | ICD-10-CM | POA: Insufficient documentation

## 2015-11-09 DIAGNOSIS — F17200 Nicotine dependence, unspecified, uncomplicated: Secondary | ICD-10-CM | POA: Diagnosis not present

## 2015-11-09 DIAGNOSIS — Z79899 Other long term (current) drug therapy: Secondary | ICD-10-CM | POA: Diagnosis not present

## 2015-11-09 DIAGNOSIS — Z955 Presence of coronary angioplasty implant and graft: Secondary | ICD-10-CM | POA: Diagnosis not present

## 2015-11-09 DIAGNOSIS — Z01818 Encounter for other preprocedural examination: Secondary | ICD-10-CM | POA: Insufficient documentation

## 2015-11-09 DIAGNOSIS — H409 Unspecified glaucoma: Secondary | ICD-10-CM | POA: Insufficient documentation

## 2015-11-09 DIAGNOSIS — Z01812 Encounter for preprocedural laboratory examination: Secondary | ICD-10-CM | POA: Diagnosis not present

## 2015-11-09 DIAGNOSIS — E785 Hyperlipidemia, unspecified: Secondary | ICD-10-CM | POA: Insufficient documentation

## 2015-11-09 DIAGNOSIS — Z0183 Encounter for blood typing: Secondary | ICD-10-CM | POA: Insufficient documentation

## 2015-11-09 DIAGNOSIS — I714 Abdominal aortic aneurysm, without rupture: Secondary | ICD-10-CM | POA: Diagnosis not present

## 2015-11-09 DIAGNOSIS — Z6825 Body mass index (BMI) 25.0-25.9, adult: Secondary | ICD-10-CM | POA: Diagnosis not present

## 2015-11-09 DIAGNOSIS — I10 Essential (primary) hypertension: Secondary | ICD-10-CM | POA: Diagnosis not present

## 2015-11-09 HISTORY — DX: Unspecified glaucoma: H40.9

## 2015-11-09 HISTORY — DX: Malignant (primary) neoplasm, unspecified: C80.1

## 2015-11-09 HISTORY — DX: Presence of spectacles and contact lenses: Z97.3

## 2015-11-09 LAB — BLOOD GAS, ARTERIAL
Acid-base deficit: 3.5 mmol/L — ABNORMAL HIGH (ref 0.0–2.0)
Bicarbonate: 20.6 mmol/L (ref 20.0–28.0)
Drawn by: 242311
FIO2: 0.21
O2 Saturation: 97.1 %
Patient temperature: 98.6
pCO2 arterial: 34.8 mmHg (ref 32.0–48.0)
pH, Arterial: 7.39 (ref 7.350–7.450)
pO2, Arterial: 92.7 mmHg (ref 83.0–108.0)

## 2015-11-09 LAB — CBC
HCT: 46 % (ref 39.0–52.0)
Hemoglobin: 15.8 g/dL (ref 13.0–17.0)
MCH: 33.1 pg (ref 26.0–34.0)
MCHC: 34.3 g/dL (ref 30.0–36.0)
MCV: 96.2 fL (ref 78.0–100.0)
Platelets: 123 10*3/uL — ABNORMAL LOW (ref 150–400)
RBC: 4.78 MIL/uL (ref 4.22–5.81)
RDW: 13 % (ref 11.5–15.5)
WBC: 8.7 10*3/uL (ref 4.0–10.5)

## 2015-11-09 LAB — COMPREHENSIVE METABOLIC PANEL
ALT: 13 U/L — ABNORMAL LOW (ref 17–63)
AST: 16 U/L (ref 15–41)
Albumin: 3.7 g/dL (ref 3.5–5.0)
Alkaline Phosphatase: 57 U/L (ref 38–126)
Anion gap: 7 (ref 5–15)
BUN: 16 mg/dL (ref 6–20)
CO2: 22 mmol/L (ref 22–32)
Calcium: 8.8 mg/dL — ABNORMAL LOW (ref 8.9–10.3)
Chloride: 110 mmol/L (ref 101–111)
Creatinine, Ser: 1.08 mg/dL (ref 0.61–1.24)
GFR calc Af Amer: >60 mL/min (ref >60)
GFR calc non Af Amer: >60 mL/min (ref >60)
Glucose, Bld: 87 mg/dL (ref 65–99)
Potassium: 4.2 mmol/L (ref 3.5–5.1)
Sodium: 139 mmol/L (ref 135–145)
Total Bilirubin: 0.5 mg/dL (ref 0.3–1.2)
Total Protein: 6.6 g/dL (ref 6.5–8.1)

## 2015-11-09 LAB — PROTIME-INR
INR: 1.09
Prothrombin Time: 14.2 seconds (ref 11.4–15.2)

## 2015-11-09 LAB — TYPE AND SCREEN
ABO/RH(D): O POS
Antibody Screen: NEGATIVE

## 2015-11-09 LAB — ABO/RH: ABO/RH(D): O POS

## 2015-11-09 LAB — SURGICAL PCR SCREEN
MRSA, PCR: NEGATIVE
Staphylococcus aureus: POSITIVE — AB

## 2015-11-09 LAB — APTT: aPTT: 37 seconds — ABNORMAL HIGH (ref 24–36)

## 2015-11-09 NOTE — Progress Notes (Signed)
Pt denies SOB and chest pain but is under the care of Dr. Marijo File, Cardiology of [facility]. Pt stated that cardiologist would have a record of  recent EKG,stress test, echo and cardiac cath; records requested. Pt denies having any recent labs. Pt denies having a recent chest x ray. Pt chart forwarded to anesthesia to review cardiac history.

## 2015-11-10 ENCOUNTER — Encounter: Payer: Self-pay | Admitting: Vascular Surgery

## 2015-11-10 NOTE — Progress Notes (Addendum)
Anesthesia Chart Review:  Pt is a 70 year old male scheduled for abdominal aortic endovascular stent graft on 11/14/2015 with Ruta Hinds, MD.   - Cardiologist is Clarene Critchley, MD (notes in care everywhere) who has cleared pt for surgery at moderate risk. Last office visit 11/09/15.   PMH includes:  CAD (s/p stent 2005), AAA, HTN, hyperlidipemia, glaucoma.  Current smoker. BMI 26  Medications include: ASA, metoprolol, ramipril, crestor, timolol  Preoperative labs reviewed.    EKG 11/09/15: Sinus bradycardia (51 bpm). Low voltage in precordial leads. Old anterior infarct.  Nuclear stress test 02/27/07: 1. Functional capacity is not assessed. 2. Normal resting biventricular function with LVEF of 64%. 3. No scintigraphic ischemia during vasodilator stress. No pharmacologic stressed induced hypoperfusion.  If no changes, I anticipate pt can proceed with surgery as scheduled.   Willeen Cass, FNP-BC [facility] Stay [facility]/Anesthesiology Phone: [phone] 11/11/2015 12:40 PM

## 2015-11-14 ENCOUNTER — Inpatient Hospital Stay (HOSPITAL_COMMUNITY): Payer: Medicare Other | Admitting: Certified Registered Nurse Anesthetist

## 2015-11-14 ENCOUNTER — Encounter (HOSPITAL_COMMUNITY): Payer: Self-pay | Admitting: *Deleted

## 2015-11-14 ENCOUNTER — Other Ambulatory Visit: Payer: Self-pay | Admitting: *Deleted

## 2015-11-14 ENCOUNTER — Encounter (HOSPITAL_COMMUNITY)
Admission: RE | Disposition: A | Discharge status: Home Health | Payer: Self-pay | Source: Ambulatory Visit | Attending: Vascular Surgery

## 2015-11-14 ENCOUNTER — Inpatient Hospital Stay (HOSPITAL_COMMUNITY): Payer: Medicare Other | Admitting: Emergency Medicine

## 2015-11-14 ENCOUNTER — Inpatient Hospital Stay (HOSPITAL_COMMUNITY): Payer: Medicare Other

## 2015-11-14 ENCOUNTER — Inpatient Hospital Stay (HOSPITAL_COMMUNITY)
Admission: RE | Admit: 2015-11-14 | Discharge: 2015-11-15 | DRG: 269 | Disposition: A | Discharge status: Home Health | Payer: Medicare Other | Source: Ambulatory Visit | Attending: Vascular Surgery | Admitting: Vascular Surgery

## 2015-11-14 DIAGNOSIS — I10 Essential (primary) hypertension: Secondary | ICD-10-CM | POA: Diagnosis present

## 2015-11-14 DIAGNOSIS — I252 Old myocardial infarction: Secondary | ICD-10-CM

## 2015-11-14 DIAGNOSIS — I2510 Atherosclerotic heart disease of native coronary artery without angina pectoris: Secondary | ICD-10-CM | POA: Diagnosis present

## 2015-11-14 DIAGNOSIS — E785 Hyperlipidemia, unspecified: Secondary | ICD-10-CM | POA: Diagnosis present

## 2015-11-14 DIAGNOSIS — Z7982 Long term (current) use of aspirin: Secondary | ICD-10-CM | POA: Diagnosis not present

## 2015-11-14 DIAGNOSIS — Z48812 Encounter for surgical aftercare following surgery on the circulatory system: Secondary | ICD-10-CM

## 2015-11-14 DIAGNOSIS — I714 Abdominal aortic aneurysm, without rupture, unspecified: Secondary | ICD-10-CM

## 2015-11-14 DIAGNOSIS — Z8249 Family history of ischemic heart disease and other diseases of the circulatory system: Secondary | ICD-10-CM | POA: Diagnosis not present

## 2015-11-14 DIAGNOSIS — H409 Unspecified glaucoma: Secondary | ICD-10-CM | POA: Diagnosis present

## 2015-11-14 DIAGNOSIS — Z8679 Personal history of other diseases of the circulatory system: Secondary | ICD-10-CM

## 2015-11-14 DIAGNOSIS — Z955 Presence of coronary angioplasty implant and graft: Secondary | ICD-10-CM | POA: Diagnosis not present

## 2015-11-14 DIAGNOSIS — Z95828 Presence of other vascular implants and grafts: Secondary | ICD-10-CM

## 2015-11-14 DIAGNOSIS — J9811 Atelectasis: Secondary | ICD-10-CM | POA: Diagnosis not present

## 2015-11-14 HISTORY — PX: ABDOMINAL AORTIC ENDOVASCULAR STENT GRAFT: SHX5707

## 2015-11-14 LAB — CBC
HCT: 39.5 % (ref 39.0–52.0)
Hemoglobin: 13.4 g/dL (ref 13.0–17.0)
MCH: 32.4 pg (ref 26.0–34.0)
MCHC: 33.9 g/dL (ref 30.0–36.0)
MCV: 95.6 fL (ref 78.0–100.0)
Platelets: 109 10*3/uL — ABNORMAL LOW (ref 150–400)
RBC: 4.13 MIL/uL — ABNORMAL LOW (ref 4.22–5.81)
RDW: 12.9 % (ref 11.5–15.5)
WBC: 8.6 10*3/uL (ref 4.0–10.5)

## 2015-11-14 LAB — BASIC METABOLIC PANEL
Anion gap: 5 (ref 5–15)
BUN: 8 mg/dL (ref 6–20)
CO2: 22 mmol/L (ref 22–32)
Calcium: 8.1 mg/dL — ABNORMAL LOW (ref 8.9–10.3)
Chloride: 110 mmol/L (ref 101–111)
Creatinine, Ser: 0.92 mg/dL (ref 0.61–1.24)
GFR calc Af Amer: >60 mL/min (ref >60)
GFR calc non Af Amer: >60 mL/min (ref >60)
Glucose, Bld: 90 mg/dL (ref 65–99)
Potassium: 4.3 mmol/L (ref 3.5–5.1)
Sodium: 137 mmol/L (ref 135–145)

## 2015-11-14 LAB — APTT: aPTT: 46 seconds — ABNORMAL HIGH (ref 24–36)

## 2015-11-14 LAB — URINALYSIS, ROUTINE W REFLEX MICROSCOPIC
Bilirubin Urine: NEGATIVE
Glucose, UA: NEGATIVE mg/dL
Hgb urine dipstick: NEGATIVE
Ketones, ur: NEGATIVE mg/dL
Leukocytes, UA: NEGATIVE
Nitrite: NEGATIVE
Protein, ur: NEGATIVE mg/dL
Specific Gravity, Urine: 1.018 (ref 1.005–1.030)
pH: 5.5 (ref 5.0–8.0)

## 2015-11-14 LAB — PROTIME-INR
INR: 1.2
Prothrombin Time: 15.3 seconds — ABNORMAL HIGH (ref 11.4–15.2)

## 2015-11-14 LAB — MAGNESIUM: Magnesium: 1.6 mg/dL — ABNORMAL LOW (ref 1.7–2.4)

## 2015-11-14 SURGERY — INSERTION, ENDOVASCULAR STENT GRAFT, AORTA, ABDOMINAL
Anesthesia: General

## 2015-11-14 MED ORDER — OXYCODONE-ACETAMINOPHEN 5-325 MG PO TABS
1.0000 | ORAL_TABLET | ORAL | Status: DC | PRN
  Administered 2015-11-14: 2 via ORAL
  Filled 2015-11-14: qty 2

## 2015-11-14 MED ORDER — SODIUM CHLORIDE 0.9 % IV SOLN: 500.0000 mL | Freq: Once | INTRAVENOUS | Status: DC | PRN

## 2015-11-14 MED ORDER — ALUM & MAG HYDROXIDE-SIMETH 200-200-20 MG/5ML PO SUSP: 15.0000 mL | ORAL | Status: DC | PRN

## 2015-11-14 MED ORDER — SODIUM CHLORIDE 0.9 % IV SOLN
INTRAVENOUS | Status: DC
  Administered 2015-11-14: 15:00:00 via INTRAVENOUS

## 2015-11-14 MED ORDER — OXYCODONE-ACETAMINOPHEN 5-325 MG PO TABS
ORAL_TABLET | ORAL | Status: AC
  Filled 2015-11-14: qty 2

## 2015-11-14 MED ORDER — MAGNESIUM SULFATE 2 GM/50ML IV SOLN: 2.0000 g | Freq: Every day | INTRAVENOUS | Status: DC | PRN

## 2015-11-14 MED ORDER — CHLORHEXIDINE GLUCONATE CLOTH 2 % EX PADS: 6.0000 | MEDICATED_PAD | Freq: Once | CUTANEOUS | Status: DC

## 2015-11-14 MED ORDER — BRIMONIDINE TARTRATE 0.2 % OP SOLN
1.0000 [drp] | Freq: Three times a day (TID) | OPHTHALMIC | Status: DC
  Administered 2015-11-14 – 2015-11-15 (×2): 1 [drp] via OPHTHALMIC
  Filled 2015-11-14: qty 5

## 2015-11-14 MED ORDER — PROMETHAZINE HCL 25 MG/ML IJ SOLN
6.2500 mg | INTRAMUSCULAR | Status: DC | PRN
Start: 2015-11-14 — End: 2015-11-14

## 2015-11-14 MED ORDER — SUGAMMADEX SODIUM 200 MG/2ML IV SOLN
INTRAVENOUS | Status: DC | PRN
  Administered 2015-11-14: 150 mg via INTRAVENOUS

## 2015-11-14 MED ORDER — SODIUM CHLORIDE 0.9 % IV SOLN: INTRAVENOUS | Status: DC

## 2015-11-14 MED ORDER — GUAIFENESIN-DM 100-10 MG/5ML PO SYRP: 15.0000 mL | ORAL_SOLUTION | ORAL | Status: DC | PRN

## 2015-11-14 MED ORDER — ACETAMINOPHEN 325 MG RE SUPP: 325.0000 mg | RECTAL | Status: DC | PRN

## 2015-11-14 MED ORDER — ACETAMINOPHEN 325 MG PO TABS
325.0000 mg | ORAL_TABLET | ORAL | Status: DC | PRN
  Administered 2015-11-14 – 2015-11-15 (×2): 650 mg via ORAL
  Filled 2015-11-14 (×2): qty 2

## 2015-11-14 MED ORDER — BISACODYL 10 MG RE SUPP: 10.0000 mg | Freq: Every day | RECTAL | Status: DC | PRN

## 2015-11-14 MED ORDER — DOCUSATE SODIUM 100 MG PO CAPS
100.0000 mg | ORAL_CAPSULE | Freq: Every day | ORAL | Status: DC
  Administered 2015-11-15: 100 mg via ORAL
  Filled 2015-11-14: qty 1

## 2015-11-14 MED ORDER — ROSUVASTATIN CALCIUM 10 MG PO TABS
10.0000 mg | ORAL_TABLET | Freq: Every day | ORAL | Status: DC
  Administered 2015-11-14 – 2015-11-15 (×2): 10 mg via ORAL
  Filled 2015-11-14 (×2): qty 1

## 2015-11-14 MED ORDER — ARTIFICIAL TEARS OP OINT
TOPICAL_OINTMENT | OPHTHALMIC | Status: DC | PRN
  Administered 2015-11-14: 1 via OPHTHALMIC

## 2015-11-14 MED ORDER — LIDOCAINE HCL (CARDIAC) 20 MG/ML IV SOLN
INTRAVENOUS | Status: DC | PRN
  Administered 2015-11-14: 60 mg via INTRAVENOUS

## 2015-11-14 MED ORDER — DEXTROSE 5 % IV SOLN
1.5000 g | Freq: Two times a day (BID) | INTRAVENOUS | Status: AC
  Administered 2015-11-14 – 2015-11-15 (×2): 1.5 g via INTRAVENOUS
  Filled 2015-11-14 (×2): qty 1.5

## 2015-11-14 MED ORDER — PROPOFOL 10 MG/ML IV BOLUS
INTRAVENOUS | Status: DC | PRN
  Administered 2015-11-14: 160 mg via INTRAVENOUS

## 2015-11-14 MED ORDER — MAGNESIUM HYDROXIDE 400 MG/5ML PO SUSP
30.0000 mL | Freq: Every day | ORAL | Status: DC | PRN
Start: 2015-11-14 — End: 2015-11-15

## 2015-11-14 MED ORDER — HEPARIN SODIUM (PORCINE) 1000 UNIT/ML IJ SOLN
INTRAMUSCULAR | Status: AC
  Filled 2015-11-14: qty 1

## 2015-11-14 MED ORDER — EPHEDRINE SULFATE 50 MG/ML IJ SOLN
INTRAMUSCULAR | Status: DC | PRN
  Administered 2015-11-14: 5 mg via INTRAVENOUS

## 2015-11-14 MED ORDER — PROTAMINE SULFATE 10 MG/ML IV SOLN
INTRAVENOUS | Status: AC
  Filled 2015-11-14: qty 5

## 2015-11-14 MED ORDER — ONDANSETRON HCL 4 MG/2ML IJ SOLN: 4.0000 mg | Freq: Four times a day (QID) | INTRAMUSCULAR | Status: DC | PRN

## 2015-11-14 MED ORDER — METOPROLOL TARTRATE 5 MG/5ML IV SOLN
2.0000 mg | INTRAVENOUS | Status: DC | PRN
Start: 2015-11-14 — End: 2015-11-15

## 2015-11-14 MED ORDER — SODIUM CHLORIDE 0.9 % IV SOLN
INTRAVENOUS | Status: DC | PRN
  Administered 2015-11-14: 500 mL

## 2015-11-14 MED ORDER — EPHEDRINE 5 MG/ML INJ
INTRAVENOUS | Status: AC
  Filled 2015-11-14: qty 20

## 2015-11-14 MED ORDER — LABETALOL HCL 5 MG/ML IV SOLN
INTRAVENOUS | Status: AC
  Filled 2015-11-14: qty 4

## 2015-11-14 MED ORDER — DORZOLAMIDE HCL 2 % OP SOLN
1.0000 [drp] | Freq: Three times a day (TID) | OPHTHALMIC | Status: DC
  Administered 2015-11-14 – 2015-11-15 (×2): 1 [drp] via OPHTHALMIC
  Filled 2015-11-14: qty 10

## 2015-11-14 MED ORDER — ASPIRIN EC 81 MG PO TBEC
81.0000 mg | DELAYED_RELEASE_TABLET | Freq: Every day | ORAL | Status: DC
  Administered 2015-11-15: 81 mg via ORAL
  Filled 2015-11-14: qty 1

## 2015-11-14 MED ORDER — METOPROLOL SUCCINATE ER 25 MG PO TB24
25.0000 mg | ORAL_TABLET | Freq: Every day | ORAL | Status: DC
  Administered 2015-11-15: 25 mg via ORAL
  Filled 2015-11-14: qty 1

## 2015-11-14 MED ORDER — PHENOL 1.4 % MT LIQD: 1.0000 | OROMUCOSAL | Status: DC | PRN

## 2015-11-14 MED ORDER — TIMOLOL HEMIHYDRATE 0.5 % OP SOLN
1.0000 [drp] | Freq: Every day | OPHTHALMIC | Status: DC
  Administered 2015-11-15: 1 [drp] via OPHTHALMIC
  Filled 2015-11-14: qty 5

## 2015-11-14 MED ORDER — ROCURONIUM BROMIDE 100 MG/10ML IV SOLN
INTRAVENOUS | Status: DC | PRN
  Administered 2015-11-14: 60 mg via INTRAVENOUS
  Administered 2015-11-14: 10 mg via INTRAVENOUS

## 2015-11-14 MED ORDER — HEPARIN SODIUM (PORCINE) 1000 UNIT/ML IJ SOLN
INTRAMUSCULAR | Status: DC | PRN
  Administered 2015-11-14: 8000 [IU] via INTRAVENOUS

## 2015-11-14 MED ORDER — PANTOPRAZOLE SODIUM 40 MG PO TBEC
40.0000 mg | DELAYED_RELEASE_TABLET | Freq: Every day | ORAL | Status: DC
  Administered 2015-11-14 – 2015-11-15 (×2): 40 mg via ORAL
  Filled 2015-11-14 (×2): qty 1

## 2015-11-14 MED ORDER — OXYCODONE HCL 5 MG/5ML PO SOLN: 5.0000 mg | Freq: Once | ORAL | Status: DC | PRN

## 2015-11-14 MED ORDER — FENTANYL CITRATE (PF) 100 MCG/2ML IJ SOLN
INTRAMUSCULAR | Status: DC | PRN
  Administered 2015-11-14 (×5): 50 ug via INTRAVENOUS

## 2015-11-14 MED ORDER — LABETALOL HCL 5 MG/ML IV SOLN: 10.0000 mg | INTRAVENOUS | Status: DC | PRN

## 2015-11-14 MED ORDER — LATANOPROST 0.005 % OP SOLN
1.0000 [drp] | Freq: Every day | OPHTHALMIC | Status: DC
Start: 2015-11-15 — End: 2015-11-15
  Administered 2015-11-15: 1 [drp] via OPHTHALMIC
  Filled 2015-11-14: qty 2.5

## 2015-11-14 MED ORDER — OXYCODONE HCL 5 MG PO TABS: 5.0000 mg | ORAL_TABLET | Freq: Once | ORAL | Status: DC | PRN

## 2015-11-14 MED ORDER — ENOXAPARIN SODIUM 30 MG/0.3ML ~~LOC~~ SOLN
30.0000 mg | SUBCUTANEOUS | Status: DC
  Administered 2015-11-15: 30 mg via SUBCUTANEOUS
  Filled 2015-11-14: qty 0.3

## 2015-11-14 MED ORDER — ONDANSETRON HCL 4 MG/2ML IJ SOLN
INTRAMUSCULAR | Status: DC | PRN
  Administered 2015-11-14: 4 mg via INTRAVENOUS

## 2015-11-14 MED ORDER — DEXTROSE 5 % IV SOLN
1.5000 g | INTRAVENOUS | Status: AC
  Administered 2015-11-14: 1.5 g via INTRAVENOUS
  Filled 2015-11-14: qty 1.5

## 2015-11-14 MED ORDER — LACTATED RINGERS IV SOLN
INTRAVENOUS | Status: DC
  Administered 2015-11-14 (×2): via INTRAVENOUS

## 2015-11-14 MED ORDER — DEXTROSE 5 % IV SOLN
INTRAVENOUS | Status: DC | PRN
  Administered 2015-11-14: 25 ug/min via INTRAVENOUS

## 2015-11-14 MED ORDER — IODIXANOL 320 MG/ML IV SOLN
INTRAVENOUS | Status: DC | PRN
  Administered 2015-11-14: 150 mL via INTRA_ARTERIAL
  Administered 2015-11-14: 50 mL via INTRA_ARTERIAL

## 2015-11-14 MED ORDER — FENTANYL CITRATE (PF) 250 MCG/5ML IJ SOLN
INTRAMUSCULAR | Status: AC
  Filled 2015-11-14: qty 5

## 2015-11-14 MED ORDER — FENTANYL CITRATE (PF) 100 MCG/2ML IJ SOLN: 25.0000 ug | INTRAMUSCULAR | Status: DC | PRN

## 2015-11-14 MED ORDER — HYDRALAZINE HCL 20 MG/ML IJ SOLN: 5.0000 mg | INTRAMUSCULAR | Status: DC | PRN

## 2015-11-14 MED ORDER — 0.9 % SODIUM CHLORIDE (POUR BTL) OPTIME
TOPICAL | Status: DC | PRN
  Administered 2015-11-14: 1000 mL

## 2015-11-14 MED ORDER — PROTAMINE SULFATE 10 MG/ML IV SOLN
INTRAVENOUS | Status: DC | PRN
  Administered 2015-11-14: 50 mg via INTRAVENOUS

## 2015-11-14 MED ORDER — MORPHINE SULFATE (PF) 2 MG/ML IV SOLN: 2.0000 mg | INTRAVENOUS | Status: DC | PRN

## 2015-11-14 MED ORDER — POTASSIUM CHLORIDE CRYS ER 20 MEQ PO TBCR
20.0000 meq | EXTENDED_RELEASE_TABLET | Freq: Every day | ORAL | Status: DC | PRN
Start: 2015-11-14 — End: 2015-11-15

## 2015-11-14 SURGICAL SUPPLY — 66 items
ADH SKN CLS APL DERMABOND .7 (GAUZE/BANDAGES/DRESSINGS) ×2
AGENT HMST SPONGE THK3/8 (HEMOSTASIS)
CANISTER SUCTION 2500CC (MISCELLANEOUS) ×2 IMPLANT
CATH ANGIO 5F BER 65CM (CATHETERS) ×2 IMPLANT
CATH ANGIO 5F BER2 65CM (CATHETERS) ×2 IMPLANT
CATH BEACON 5.038 65CM KMP-01 (CATHETERS) IMPLANT
CATH OMNI FLUSH .035X70CM (CATHETERS) ×2 IMPLANT
COVER PROBE W GEL 5X96 (DRAPES) ×2 IMPLANT
DERMABOND ADVANCED (GAUZE/BANDAGES/DRESSINGS) ×2
DERMABOND ADVANCED .7 DNX12 (GAUZE/BANDAGES/DRESSINGS) ×2 IMPLANT
DEVICE CLOSURE PERCLS PRGLD 6F (VASCULAR PRODUCTS) ×5 IMPLANT
DEVICE TORQUE KENDALL .025-038 (MISCELLANEOUS) ×2 IMPLANT
DRSG TEGADERM 2-3/8X2-3/4 SM (GAUZE/BANDAGES/DRESSINGS) ×4 IMPLANT
DRYSEAL FLEXSHEATH 12FR 33CM (SHEATH) ×1
DRYSEAL FLEXSHEATH 18FR 33CM (SHEATH) ×1
ELECT CAUTERY BLADE 6.4 (BLADE) ×2 IMPLANT
ELECT REM PT RETURN 9FT ADLT (ELECTROSURGICAL) ×4
ELECTRODE REM PT RTRN 9FT ADLT (ELECTROSURGICAL) ×2 IMPLANT
EXCLUDER TNK LEG 31MX14X17 (Endovascular Graft) ×1 IMPLANT
EXCLUDER TRUNK LEG 31MX14X17 (Endovascular Graft) ×2 IMPLANT
GLOVE BIO SURGEON STRL SZ 6.5 (GLOVE) ×2 IMPLANT
GLOVE BIO SURGEON STRL SZ7.5 (GLOVE) ×4 IMPLANT
GLOVE BIOGEL PI IND STRL 6.5 (GLOVE) ×3 IMPLANT
GLOVE BIOGEL PI IND STRL 8 (GLOVE) ×1 IMPLANT
GLOVE BIOGEL PI INDICATOR 6.5 (GLOVE) ×3
GLOVE BIOGEL PI INDICATOR 8 (GLOVE) ×1
GLOVE ECLIPSE 7.5 STRL STRAW (GLOVE) ×2 IMPLANT
GOWN SPEC L4 XLG W/TWL (GOWN DISPOSABLE) ×2 IMPLANT
GOWN STRL REUS W/ TWL LRG LVL3 (GOWN DISPOSABLE) ×3 IMPLANT
GOWN STRL REUS W/TWL LRG LVL3 (GOWN DISPOSABLE) ×6
GRAFT BALLN CATH 65CM (STENTS) ×1 IMPLANT
GRAFT EXCLUDER LEG 16X10 (Endovascular Graft) ×2 IMPLANT
GUIDEWIRE ANGLED .035X150CM (WIRE) ×2 IMPLANT
HEMOSTAT SPONGE AVITENE ULTRA (HEMOSTASIS) IMPLANT
KIT BASIN OR (CUSTOM PROCEDURE TRAY) ×2 IMPLANT
KIT ROOM TURNOVER OR (KITS) ×2 IMPLANT
LEG CONTRALATERAL 16X20X13.5 (Vascular Products) ×2 IMPLANT
LIQUID BAND (GAUZE/BANDAGES/DRESSINGS) ×2 IMPLANT
NDL PERC 18GX7CM (NEEDLE) ×1 IMPLANT
NEEDLE PERC 18GX7CM (NEEDLE) ×2 IMPLANT
NS IRRIG 1000ML POUR BTL (IV SOLUTION) ×2 IMPLANT
PACK ENDOVASCULAR (PACKS) ×2 IMPLANT
PAD ARMBOARD 7.5X6 YLW CONV (MISCELLANEOUS) ×4 IMPLANT
PENCIL BUTTON HOLSTER BLD 10FT (ELECTRODE) ×2 IMPLANT
PERCLOSE PROGLIDE 6F (VASCULAR PRODUCTS) ×10
SHEATH AVANTI 11CM 5FR (MISCELLANEOUS) ×4 IMPLANT
SHEATH AVANTI 11CM 8FR (MISCELLANEOUS) ×8 IMPLANT
SHEATH BRITE TIP 8FR 23CM (MISCELLANEOUS) IMPLANT
SHEATH DRYSEAL FLEX 12FR 33CM (SHEATH) ×1 IMPLANT
SHEATH DRYSEAL FLEX 18FR 33CM (SHEATH) ×1 IMPLANT
SHEATH INTROD 8 FR 25CM (MISCELLANEOUS) ×2 IMPLANT
STAPLER VISISTAT 35W (STAPLE) IMPLANT
STENT GRAFT BALLN CATH 65CM (STENTS) ×1
STENT GRAFT CONTRALAT 20X13.5 (Vascular Products) ×1 IMPLANT
STOPCOCK MORSE 400PSI 3WAY (MISCELLANEOUS) ×4 IMPLANT
SUT PROLENE 5 0 C 1 24 (SUTURE) IMPLANT
SUT VIC AB 2-0 SH 27 (SUTURE)
SUT VIC AB 2-0 SH 27XBRD (SUTURE) IMPLANT
SUT VIC AB 3-0 SH 27 (SUTURE)
SUT VIC AB 3-0 SH 27X BRD (SUTURE) IMPLANT
SUT VICRYL 4-0 PS2 18IN ABS (SUTURE) ×4 IMPLANT
SYR 30ML LL (SYRINGE) ×2 IMPLANT
TRAY FOLEY W/METER SILVER 16FR (SET/KITS/TRAYS/PACK) ×2 IMPLANT
TUBING HIGH PRESSURE 120CM (CONNECTOR) ×2 IMPLANT
WIRE AMPLATZ SS-J .035X180CM (WIRE) ×6 IMPLANT
WIRE BENTSON .035X145CM (WIRE) ×2 IMPLANT

## 2015-11-14 NOTE — Interval H&P Note (Signed)
History and Physical Interval Note:  11/14/2015 8:24 AM  Riley Sharp  has presented today for surgery, with the diagnosis of Abdominal aortic aneurysm I71.4  The various methods of treatment have been discussed with the patient and family. After consideration of risks, benefits and other options for treatment, the patient has consented to  Procedure(s): ABDOMINAL AORTIC ENDOVASCULAR STENT GRAFT (N/A) as a surgical intervention .  The patient's history has been reviewed, patient examined, no change in status, stable for surgery.  I have reviewed the patient's chart and labs.  Questions were answered to the patient's satisfaction.     Ruta Hinds

## 2015-11-14 NOTE — Transfer of Care (Signed)
Immediate Anesthesia Transfer of Care Note  Patient: Riley Sharp  Procedure(s) Performed: Procedure(s): ABDOMINAL AORTIC ENDOVASCULAR STENT GRAFT (N/A)  Patient Location: PACU  Anesthesia Type:General  Level of Consciousness: awake, alert , oriented and patient cooperative  Airway & Oxygen Therapy: Patient Spontanous Breathing and Patient connected to nasal cannula oxygen  Post-op Assessment: Report given to RN, Post -op Vital signs reviewed and stable and Patient moving all extremities X 4  Post vital signs: Reviewed and stable  Last Vitals:  BP 145/70 HR 56 RR 14 SpO2 100% Resting comfortably, maintains good airway, denies pain.   Last Pain:  Vitals:   11/14/15 0740  TempSrc: Oral      Patients Stated Pain Goal: 4 (47/30/85 6943)  Complications: No apparent anesthesia complications

## 2015-11-14 NOTE — Anesthesia Preprocedure Evaluation (Signed)
Anesthesia Evaluation  Patient identified by MRN, date of birth, ID band Patient awake    Reviewed: Allergy & Precautions, NPO status , Patient's Chart, lab work & pertinent test results  Airway Mallampati: II  TM Distance: >3 FB Neck ROM: Full    Dental no notable dental hx.    Pulmonary neg pulmonary ROS, Current Smoker,    Pulmonary exam normal breath sounds clear to auscultation       Cardiovascular hypertension, + CAD, + Past MI and + Peripheral Vascular Disease  Normal cardiovascular exam Rhythm:Regular Rate:Normal     Neuro/Psych negative neurological ROS  negative psych ROS   GI/Hepatic negative GI ROS, Neg liver ROS,   Endo/Other  negative endocrine ROS  Renal/GU negative Renal ROS  negative genitourinary   Musculoskeletal negative musculoskeletal ROS (+)   Abdominal   Peds negative pediatric ROS (+)  Hematology negative hematology ROS (+)   Anesthesia Other Findings   Reproductive/Obstetrics negative OB ROS                             Anesthesia Physical Anesthesia Plan  ASA: III  Anesthesia Plan: General   Post-op Pain Management:    Induction: Intravenous  Airway Management Planned: Oral ETT  Additional Equipment: Arterial line  Intra-op Plan:   Post-operative Plan: Extubation in OR  Informed Consent: I have reviewed the patients History and Physical, chart, labs and discussed the procedure including the risks, benefits and alternatives for the proposed anesthesia with the patient or authorized representative who has indicated his/her understanding and acceptance.   Dental advisory given  Plan Discussed with: CRNA and Surgeon  Anesthesia Plan Comments:         Anesthesia Quick Evaluation

## 2015-11-14 NOTE — Progress Notes (Addendum)
  Day of Surgery Note    Subjective:  No complaints  Vitals:   11/14/15 0740 11/14/15 1150  BP: 132/72   Pulse: (!) 54   Resp: (!) 0   Temp:  (P) 97.8 F (36.6 C)    Incisions:   Bilateral groins are soft without hematoma Extremities:  Faintly palpable DP pulses bilaterally Cardiac:  regular Lungs:  Non labored Abdomen:  Soft, NT/ND   Assessment/Plan:  This is a 70 y.o. male who is s/p EVAR  -pt doing well in pacu -bilateral feet are warm and well perfused -to 3 south when bed available *200cc contrast used intraoperatively   Leontine Locket, PA-C 11/14/2015 11:59 AM [phone]

## 2015-11-14 NOTE — Progress Notes (Signed)
Cybil, RN on 3S has agreed to monitor patient's arterial line as I have not received appropriate education. Charge Nurse Jarrett Soho made aware.

## 2015-11-14 NOTE — Care Management Note (Signed)
Case Management Note  Patient Details  Name: Riley Sharp MRN: [id number] Date of Birth: Oct 05, 1945  Subjective/Objective:    S/p AAA repair, he lives alone, pta indep, he has pcp [name], he has medication coverage and he states he believes he has transportation at dc.  NCM informed him that he is pre set with [facility], PT, and OT at MD office before admitted, patient states " lets wait and see if he needs this before he goes home before he makes a decision if he would want this".  NCM will cont to follow for dc needs.                 Action/Plan:   Expected Discharge Date:                  Expected Discharge Plan:  [place]  In-House Referral:     Discharge planning Services  CM Consult  Post Acute Care Choice:    Choice offered to:     DME Arranged:    DME Agency:     HH Arranged:    [place] Agency:     Status of Service:  In process, will continue to follow  If discussed at Long Length of Stay Meetings, dates discussed:    Additional Comments:  Zenon Mayo, RN 11/14/2015, 5:01 PM

## 2015-11-14 NOTE — Op Note (Signed)
    NAME: Riley Sharp   MRN: [id number] DOB: [birth year]    DATE OF OPERATION: 11/14/2015  PREOP DIAGNOSIS: Abdominal aortic aneurysm  POSTOP DIAGNOSIS: Same  PROCEDURE: Percutaneous endovascular repair of abdominal aortic aneurysm  CO-SURGEON: Judeth Cornfield. Scot Dock, MD, Ruta Hinds, MD  ASSIST: Leontine Locket, PA  ANESTHESIA: Gen.   EBL: Per anesthesia record  FINDINGS: Completion arteriogram showed no type I or type II endoleak. Graft was in excellent position.  TECHNIQUE: The left femoral artery had been pre-closed. The 5 French sheath in the left groin was exchanged for a 12 French sheath over an Amplatz wire. Once the trunk ipsilateral component had been deployed, the wire was exchanged for a Bentson wire. This was pulled down below the gate. I then used a Berenstein 2 catheter to direct the wire into the contralateral gate. The Berenstein catheter was then exchanged for the Omni flush catheter and positioned in the main body. It was then turned without difficulty to demonstrate that it was within the graft itself. The Amplatz wire was then advanced into the descending thoracic aorta. The contralateral limb was a 20 mm x 13.5 cm device. This was selected after a left iliac arteriogram was obtained using an RAO projection to demonstrate the position of the hypogastric artery. The contralateral limb was deployed into the contralateral plate and landed above the hypogastric artery. Once the rectum and fully deployed the proximal graft, overlap regions, distal graft were ballooned with the [employer] balloon.   On the left side, the 12 French sheath was removed over the [place] wire in the 2 previously placed Perclose devices were secured with good hemostasis. The wire was then removed and the Perclose devices were again secured with good hemostasis. The sutures were then cut.  The remainder of the dictation is as per Dr. Oneida Alar.  Deitra Mayo, MD, FACS [facility]  DATE OF DICTATION:   11/14/2015

## 2015-11-14 NOTE — Anesthesia Postprocedure Evaluation (Signed)
Anesthesia Post Note  Patient: Riley Sharp  Procedure(s) Performed: Procedure(s) (LRB): ABDOMINAL AORTIC ENDOVASCULAR STENT GRAFT (N/A)  Patient location during evaluation: PACU Anesthesia Type: General Level of consciousness: awake and alert Pain management: pain level controlled Vital Signs Assessment: post-procedure vital signs reviewed and stable Respiratory status: spontaneous breathing, nonlabored ventilation, respiratory function stable and patient connected to nasal cannula oxygen Cardiovascular status: blood pressure returned to baseline and stable Postop Assessment: no signs of nausea or vomiting Anesthetic complications: no    Last Vitals:  Vitals:   11/14/15 1150 11/14/15 1204  BP:  126/68  Pulse:  (!) 51  Resp:  13  Temp: 36.6 C     Last Pain:  Vitals:   11/14/15 0740  TempSrc: Oral                 Kissa Campoy S

## 2015-11-14 NOTE — H&P (View-Only) (Signed)
Patient name: Riley Sharp  MRN: [id number]        DOB: 25-Feb-1945          Sex: male       HPI: FROYLAN HOBBY is a 70 y.o. male,  Who has been followed for a AAA since 2012.  His AAA ultrasound showed an increase in size to 5.3 cm diameter from the previous 4.86  In 7 months time.  He reports no symptoms of abdominal or sever lumbar pain.   Other medical problems include HTN managed with Metoprolol, CAD s/p stent after MI in 2005, and Glaucoma.  He takes a daily 81 mg aspirin.         Past Medical History:  Diagnosis Date  . AAA (abdominal aortic aneurysm) ([place])    . CAD (coronary artery disease)    . Hyperlipidemia    . Hypertension    . Myocardial infarction [facility]) 03/2003         Past Surgical History:  Procedure Laterality Date  . APPENDECTOMY      . CHOLECYSTECTOMY   2007    Gall Bladder  . heart stent   Feb. 14, 2005            Family History  Problem Relation Age of Onset  . Cancer Father    . Heart disease Father        After age 39  . Hyperlipidemia Father    . Hypertension Father    . Heart attack Father    . Heart disease Sister        After age 65  . Hyperlipidemia Sister    . Heart attack Sister    . Hypertension Other    . Heart disease Other    . Cancer Other        SOCIAL HISTORY: Social History    Social History  . Marital status: Single      Spouse name: N/A  . Number of children: N/A  . Years of education: N/A       Occupational History  . Not on file.          Social History Main Topics  . Smoking status: Current Some Day Smoker      Packs/day: 2.00      Years: 45.00      Types: Cigars  . Smokeless tobacco: Never Used  . Alcohol use No        Comment: social drinker  . Drug use: No  . Sexual activity: Not on file    Other Topics Concern  . Not on file       Social History Narrative  . No narrative on file      No Known Allergies         Current Outpatient Prescriptions  Medication Sig Dispense Refill  . aspirin EC 81  MG tablet Take 81 mg by mouth daily.        . brimonidine (ALPHAGAN) 0.2 % ophthalmic solution Place 1 drop into both eyes 2 (two) times daily.        . brinzolamide (AZOPT) 1 % ophthalmic suspension Place 1 drop into the right eye 3 (three) times daily.        Marland Kitchen latanoprost (XALATAN) 0.005 % ophthalmic solution Place 1 drop into the right eye at bedtime.        . metoprolol succinate (TOPROL-XL) 25 MG 24 hr tablet Take 25 mg by mouth daily.        Marland Kitchen  ramipril (ALTACE) 5 MG tablet Take 5 mg by mouth daily.        . rosuvastatin (CRESTOR) 10 MG tablet Take 10 mg by mouth daily.      . timolol (BETIMOL) 0.5 % ophthalmic solution Place 1 drop into both eyes daily.          No current facility-administered medications for this visit.       ROS:    General:  No weight loss, Fever, chills   HEENT: No recent headaches, no nasal bleeding, no visual changes, no sore throat   Neurologic: No dizziness, blackouts, seizures. No recent symptoms of stroke or mini- stroke. No recent episodes of slurred speech, or temporary blindness.   Cardiac: No recent episodes of chest pain/pressure, no shortness of breath at rest.  No shortness of breath with exertion.  Denies history of atrial fibrillation or irregular heartbeat   Vascular: No history of rest pain in feet.  No history of claudication.  No history of non-healing ulcer, No history of DVT    Pulmonary: No home oxygen, no productive cough, no hemoptysis,  No asthma or wheezing   Musculoskeletal:  '[ ]'$  Arthritis, '[ ]'$  Low back pain,  '[ ]'$  Joint pain   Hematologic:No history of hypercoagulable state.  No history of easy bleeding.  No history of anemia   Gastrointestinal: No hematochezia or melena,  No gastroesophageal reflux, no trouble swallowing   Urinary: '[ ]'$  chronic Kidney disease, '[ ]'$  on HD - '[ ]'$  MWF or '[ ]'$  TTHS, '[ ]'$  Burning with urination, '[ ]'$  Frequent urination, '[ ]'$  Difficulty urinating;    Skin: No rashes   Psychological: No history of  anxiety,  No history of depression     Physical Examination    Vitals:   11/03/15 0913  BP: 103/68  Pulse: 61  Resp: 20  Temp: 98.8 F (37.1 C)  TempSrc: Oral  SpO2: 96%  Weight: 171 lb (77.6 kg)  Height: '5\' 8"'$  (1.727 m)     General:  Alert and oriented, no acute distress HEENT: Normal Neck: No bruit or JVD Pulmonary: Clear to auscultation bilaterally Cardiac: Regular Rate and Rhythm without murmur Abdomen: Soft, non-tender, non-distended, no mass, no scars Skin: No rash Extremity Pulses:  2+ radial, brachial, femoral, absent dorsalis pedis, 2+ posterior tibial pulses bilaterally Musculoskeletal: No deformity or edema      Neurologic: Upper and lower extremity motor 5/5 and symmetric   DATA:  AAA Duplex (09/01/2015)  Previous size: 4.86 cm (Date: 03/01/15)  Current size:  5.3 cm (Date: 09/01/15), limited visualization due to bowel gas and body habitus  CTA Abd/pelvis 5.3 cm diameter AAA, Read stenosis of right internal iliac artery, right common iliac measures 2.4 cm in diameter with a large amount of thrombus left common iliac is 17-18 mm. Aortic neck length is 3 cm with diameter of 29 mm   ASSESSMENT:   AAA without rupture increased size in last 7 months to 5.3 Asymptomatic.     PLAN:   Patient has had aneurysm growth over several years. I believe it is of the size now should consider repair. He should be a candidate for percutaneous Gore Excluder stent graft. He does have some tortuosity of the left external iliac artery but I believe the device should track around this. Plan will be to either embolize the right internal iliac artery and extending into the external or potentially a branch device although I am more skeptical of a branch device due to  the high-grade stenosis of his internal iliac.   He will also need cardiac risk stratification preoperatively by Dr. Faylene Million his cardiologist in [facility].   Procedure is scheduled for 11/14/2015.   risks benefits  possible complications were explained the patient today including not limited to bleeding infection myocardial events possible transfusion possible need for cut down on the femoral arteries.  Ruta Hinds, MD [facility] [place] Office: [phone] Pager: [phone]

## 2015-11-14 NOTE — Op Note (Signed)
Procedure: Gore Excluder Stent graft repair of infrarenal AAA  Preop: 5.3 cm infrarenal AAA Postop: same  Co Surgeon: Gae Gallop MD Asst: Leontine Locket PA-C  Operative findings:   #1 Bilateral Proglide closure, tortuous iliacs   #2 31x14x17 cm main body Gore Excluder device delivered via a right femoral system   #3 20 x 13.5 cm left iliac limb contralateral    #4 10 x 7 ipsilateral iliac extension right  PROCEDURE DETAIL: After obtaining informed consent the patient was taken to the operating room. The patient was placed in supine position the operating room table. After induction of general anesthesia and endotracheal intubation a Foley catheter was placed. Next the patient was prepped and draped in usual sterile fashion from the nipples down to the knees. Ultrasound was used to identify the right common femoral artery as well as the femoral bifurcation. An 11 blade was used to make a small neck in the skin over the level of the right common femoral artery. An introducer needle was then used to cannulate the right common femoral artery without difficulty. A 0.035 Bentson wire was then threaded up into the abdominal aorta through the right femoral system. This required some manipulation due to tortuosity.  I first placed a 5 Fr sheath over this and then used a 5 Fr Berenstein 2 catheter to direct this into the descending thoracic aorta.  Next, a short 67 [place] dilator was placed over the guidewire the right femoral system. This was used to dilate the tract. The dilator was then removed and a Proglide device inserted over the guidewire into the right femoral system and this was deployed at the 2:00 position. The Proglide device was then removed and an additional Proglide device was brought in operative field and deployed at the 10:00 position. The sutures were kept separate and tagged with suture tags. Next the short 9 French sheath was then placed back over the guidewire into the right femoral  system and the dilator removed and the sheath thoroughly flushed with heparinized saline. Attention was then turned to the left groin. Again using ultrasound the left common femoral artery was identified. The femoral bifurcation was also identified. A small nick was made in the skin with the 11 blade. A hemostat was used to dilate a tract down to the artery. An introducer needle was then used to cannulate the left common femoral artery without difficulty. A 0.035 Bentson wire was then threaded up into the abdominal aorta under fluoroscopic guidance. The left side was more tortuous than the right and I had to do the same sequence of 5 Fr sheath, 5 Fr Berenstein on this side.  A 9 French dilator was then placed over the wire to dilate the tract. Two Proglide devices were again brought on operative field and these were fired sequentially in the 10:00 position followed by an additional Proglide at the 2:00 position. The Proglide delivery systems were removed and the long 9 French sheath placed back over the guidewire up to the level of the iliac of the aortic bifurcation.  I then used a 5 Fr Berenstein one and two and an angled glidewire to try to cannulate the right internal iliac to embolize this.  However, it had a tight origin stenosis and I was unable to engage this.  We had planned to extend into the external covering this and I decided at this point that the origin stenosis was too tight to embolize this so we abandoned it.  At this point,  a 5 [place] Omni flush catheter was placed over the guidewire and the left groin up and the abdominal aorta. An abdominal aortogram was obtained in a 15 cranial 15 RAO position to determine level of the left and right renal arteries. At this point a 31 x 14 x 17 Gore Excluder main body device was selected. The Bentson wire from the right groin was advanced up into the descending thoracic aorta over a Kumpe catheter and the Bentsen wire replaced with an 035 Amplatz wire. An 20  Fr dryseal was placed over this.  The main body device was then placed over the Amplatz wire in the right groin and advanced up to the level of the renal arteries. There was some intitial resistance at the skin level and the skin nick was made larger which resulted in breakage of the lateral Proglide.  Magnified views of the renal arteries were performed to make sure that we were not covering these. The top portion of the stent graft was then deployed with the end of the stent just below the level of the left renal artery and this came over to just below the level of the right renal artery. The main body was delivered all the way down to the contralateral gate. Attention was then turned to the left groin. The Omni flush catheter was pulled down over a guidewire and removed and the Bentson wire placed in position to cannulate the contralateral gate. The Berenstein catheter was placed back over the guidewire in the left femoral system. A 5 [place] Kumpe catheter was placed over this and this was used to selectively catheterize the contralateral gate and the guidewire was then advanced into the descending thoracic aorta. The main body portion of the gate was confirmed by twirling the pigtail catheter. Details of this per Dr Nicole Cella note.  The pigtail  catheter was then placed in a location so that we could use its markers to determine the exact length to the iliac bifurcation. An Amplatz wire was placed back through the pigtail catheter. A retrograde contrast angiogram was performed to determine the level of the left internal iliac artery and a 20 x 13.5 cm iliac limb was selected. The pigtail catheter was removed over the guidewire and the 20 x 13.5 cm limb advanced so there was full coverage of the long marker on the contralateral limb. This was then deployed in the usual fashion down to the iliac bifurcation. The delivery system was then removed. The remainder of the ipsilateral iliac limb was also deployed.   A 10  x7 cm ipsilateral limb was placed to extend past the right iliac bifurcation and into the external.  Attention was then turned back to the left iliac system and a Gore aortic balloon placed over the wire up to the level of the proximal end of the stent and this was ballooned to profile. The limb attachment site was also ballooned as well as the distal attachment site. Attention was then turned to the right groin and the balloon was advanced over the guidewire on the right side and the distal attachment site as well as the midportion of iliac limb were also ballooned. The 5 [place] Omni flush catheter was then placed back to the guidewire on the right side and a completion arteriogram was obtained. This showed no evidence of proximal or distal type I endoleak. There was no type II endoleak. There is no filling of the aneurysm sac.     At this point the [facility]  flush catheter was removed over a guidewire. All delivery devices were removed. We then proceeded to remove the large 20 French sheath from the right side with the guidewire in place. With pressure held above and below the insertion site the lateral Proglide closure was secured down.  An additional lateral Proglide was then placed over the guidewire and deployed.  There was good hemostasis. The guidewire was removed from the right side. Attention was then turned to the left groin. In similar fashion the 12 French sheath was removed and the guidewire left in place.  The lateral Proglide was secured as well as the medial to obtain hemostasis.  There was good hemostasis and the Bentsen wire was removed from the left groin. The patient had been given 8000 units of heparin before introducing the main body device. This was reversed with 50 mg of protamine at the end of the case. Each groin puncture site was then closed with a running 4-0 Vicryl subcuticular stitch.  The patient had bilateral pedal doppler signals  The patient tolerated procedure well and there were  no complications. Instrument sponge and needle counts were correct it the end of the case. Patient was awakened in the operating room extubated and taken to the recovery room in stable condition.  Ruta Hinds, MD [facility] [place] Office: [phone] Pager: [phone]

## 2015-11-14 NOTE — Anesthesia Procedure Notes (Signed)
Procedure Name: Intubation Date/Time: 11/14/2015 9:05 AM Performed by: Willeen Cass P Pre-anesthesia Checklist: Patient identified, Emergency Drugs available, Suction available, Patient being monitored and Timeout performed Patient Re-evaluated:Patient Re-evaluated prior to inductionOxygen Delivery Method: Circle system utilized Preoxygenation: Pre-oxygenation with 100% oxygen Intubation Type: IV induction Ventilation: Mask ventilation without difficulty Laryngoscope Size: Mac and 4 Grade View: Grade I Tube type: Oral Tube size: 7.5 mm Number of attempts: 1 Airway Equipment and Method: Stylet Placement Confirmation: ETT inserted through vocal cords under direct vision,  positive ETCO2 and breath sounds checked- equal and bilateral Secured at: 23 cm Tube secured with: Tape Dental Injury: Teeth and Oropharynx as per pre-operative assessment

## 2015-11-15 ENCOUNTER — Telehealth: Payer: Self-pay | Admitting: Vascular Surgery

## 2015-11-15 ENCOUNTER — Encounter (HOSPITAL_COMMUNITY): Payer: Self-pay | Admitting: Vascular Surgery

## 2015-11-15 LAB — CBC
HCT: 37.5 % — ABNORMAL LOW (ref 39.0–52.0)
Hemoglobin: 12.7 g/dL — ABNORMAL LOW (ref 13.0–17.0)
MCH: 32.2 pg (ref 26.0–34.0)
MCHC: 33.9 g/dL (ref 30.0–36.0)
MCV: 94.9 fL (ref 78.0–100.0)
Platelets: 103 10*3/uL — ABNORMAL LOW (ref 150–400)
RBC: 3.95 MIL/uL — ABNORMAL LOW (ref 4.22–5.81)
RDW: 12.9 % (ref 11.5–15.5)
WBC: 7.6 10*3/uL (ref 4.0–10.5)

## 2015-11-15 LAB — BASIC METABOLIC PANEL
Anion gap: 6 (ref 5–15)
BUN: 9 mg/dL (ref 6–20)
CO2: 23 mmol/L (ref 22–32)
Calcium: 8 mg/dL — ABNORMAL LOW (ref 8.9–10.3)
Chloride: 107 mmol/L (ref 101–111)
Creatinine, Ser: 0.93 mg/dL (ref 0.61–1.24)
GFR calc Af Amer: >60 mL/min (ref >60)
GFR calc non Af Amer: >60 mL/min (ref >60)
Glucose, Bld: 90 mg/dL (ref 65–99)
Potassium: 4.2 mmol/L (ref 3.5–5.1)
Sodium: 136 mmol/L (ref 135–145)

## 2015-11-15 MED ORDER — OXYCODONE-ACETAMINOPHEN 5-325 MG PO TABS: 1.0000 | ORAL_TABLET | Freq: Four times a day (QID) | ORAL | 0 refills | Status: DC | PRN

## 2015-11-15 NOTE — Telephone Encounter (Signed)
-----   Message from Mena Goes, RN sent at 11/14/2015 11:27 AM EDT ----- Regarding: 4 weeks CTA    ----- Message ----- From: Gabriel Earing, PA-C Sent: 11/14/2015  11:20 AM To: Vvs Charge Pool  S/p EVAR 11/14/15.  F/u with [facility] in 4 weeks with CTA protocol.  Thanks

## 2015-11-15 NOTE — Discharge Summary (Signed)
EVAR Discharge Summary   Riley Sharp 1945-09-11 70 y.o. male  MRN: [id number]  Admission Date: 11/14/2015  Discharge Date: 11/15/15  Physician: Elam Dutch, MD  Admission Diagnosis: Abdominal aortic aneurysm I71.4   HPI:   This is a 70 y.o. male Who has been followed for a AAA since 2012.  His AAA ultrasound showed an increase in size to 5.3 cm diameter from the previous 4.86 In 7 months time. He reports no symptoms of abdominal or sever lumbar pain. Other medical problems include HTN managed with Metoprolol, CAD s/p stent after MI in 2005, and Glaucoma. He takes a daily 81 mg aspirin.   Hospital Course:  The patient was admitted to the hospital and taken to the operating room on 11/14/2015 and underwent: Gore Excluder Stent graft repair of infrarenal AAA    The pt tolerated the procedure well and was transported to the PACU in good condition.   By POD 1, he is doing well.  He has palpable DP pulses bilaterally.  His groins are soft without hematoma bilaterally.  His pain is well controlled and he is voiding after foley removal.  The remainder of the hospital course consisted of increasing mobilization and increasing intake of solids without difficulty.  CBC    Component Value Date/Time   WBC 7.6 11/15/2015 0440   RBC 3.95 (L) 11/15/2015 0440   HGB 12.7 (L) 11/15/2015 0440   HCT 37.5 (L) 11/15/2015 0440   PLT 103 (L) 11/15/2015 0440   MCV 94.9 11/15/2015 0440   MCH 32.2 11/15/2015 0440   MCHC 33.9 11/15/2015 0440   RDW 12.9 11/15/2015 0440    BMET    Component Value Date/Time   NA 136 11/15/2015 0440   K 4.2 11/15/2015 0440   CL 107 11/15/2015 0440   CO2 23 11/15/2015 0440   GLUCOSE 90 11/15/2015 0440   BUN 9 11/15/2015 0440   CREATININE 0.93 11/15/2015 0440   CALCIUM 8.0 (L) 11/15/2015 0440   GFRNONAA >60 11/15/2015 0440   GFRAA >60 11/15/2015 0440       Discharge Instructions    ABDOMINAL PROCEDURE/ANEURYSM REPAIR/AORTO-BIFEMORAL  BYPASS:  Call MD for increased abdominal pain; cramping diarrhea; nausea/vomiting    Complete by:  As directed    Call MD for:  redness, tenderness, or signs of infection (pain, swelling, bleeding, redness, odor or green/yellow discharge around incision site)    Complete by:  As directed    Call MD for:  severe or increased pain, loss or decreased feeling  in affected limb(s)    Complete by:  As directed    Call MD for:  temperature >100.5    Complete by:  As directed    Discharge wound care:    Complete by:  As directed    Shower daily with soap and water starting 11/16/15.   Driving Restrictions    Complete by:  As directed    No driving for 2 weeks & while taking pain medication.   Lifting restrictions    Complete by:  As directed    No lifting for 4 weeks   Resume previous diet    Complete by:  As directed       Discharge Diagnosis:  Abdominal aortic aneurysm I71.4  Secondary Diagnosis: Patient Active Problem List   Diagnosis Date Noted  . AAA (abdominal aortic aneurysm) without rupture ([place]) 11/14/2015  . Abdominal aneurysm without mention of rupture 02/05/2013  . Pain in limb 02/04/2012  . AAA (abdominal aortic aneurysm) (  [place]) 11/23/2010   Past Medical History:  Diagnosis Date  . AAA (abdominal aortic aneurysm) ([place])   . CAD (coronary artery disease)   . Cancer ([place])    basal cell carcimona right ear  . Glaucoma   . Hyperlipidemia   . Hypertension   . Myocardial infarction 03/2003  . Wears glasses        Medication List    TAKE these medications   aspirin EC 81 MG tablet Take 81 mg by mouth daily.   brimonidine 0.2 % ophthalmic solution Commonly known as:  ALPHAGAN Place 1 drop into both eyes 3 (three) times daily.   dorzolamide 2 % ophthalmic solution Commonly known as:  TRUSOPT Place 1 drop into both eyes 3 (three) times daily.   latanoprost 0.005 % ophthalmic solution Commonly known as:  XALATAN Place 1 drop into both eyes daily.   metoprolol  succinate 25 MG 24 hr tablet Commonly known as:  TOPROL-XL Take 25 mg by mouth daily.   oxyCODONE-acetaminophen 5-325 MG tablet Commonly known as:  PERCOCET/ROXICET Take 1 tablet by mouth every 6 (six) hours as needed for moderate pain.   ramipril 5 MG tablet Commonly known as:  ALTACE Take 5 mg by mouth daily.   rosuvastatin 10 MG tablet Commonly known as:  CRESTOR Take 10 mg by mouth daily.   timolol 0.5 % ophthalmic solution Commonly known as:  BETIMOL Place 1 drop into both eyes daily.       Prescriptions given: Percocet #5 No Refill  Instructions: 1.  Shower daily starting 11/16/15 2.  No lifting x 4 weeks 3.  No driving x 2 weeks.  Disposition: home  Patient's condition: is Good  Follow up: 1. Dr. Oneida Alar in 4 weeks with CTA   Leontine Locket, PA-C [facility] [phone] 11/15/2015  7:27 AM   - For VQI Registry use --- Instructions: Press F2 to tab through selections.  Delete question if not applicable.   Post-op:  Time to Extubation: '[x]'$  In OR, '[ ]'$  < 12 hrs, '[ ]'$  12-24 hrs, '[ ]'$  >=24 hrs Vasopressors Req. Post-op: No MI: No., '[ ]'$  Troponin only, '[ ]'$  EKG or Clinical New Arrhythmia: No CHF: No ICU Stay: 1 day in stepdown Transfusion: No  If yes, n/a units given  Complications: Resp failure: No., '[ ]'$  Pneumonia, '[ ]'$  Ventilator Chg in renal function: No., '[ ]'$  Inc. Cr > 0.5, '[ ]'$  Temp. Dialysis, '[ ]'$  Permanent dialysis Leg ischemia: No., no Surgery needed, '[ ]'$  Yes, Surgery needed, '[ ]'$  Amputation Bowel ischemia: No., '[ ]'$  Medical Rx, '[ ]'$  Surgical Rx Wound complication: No., '[ ]'$  Superficial separation/infection, '[ ]'$  Return to OR Return to OR: No  Return to OR for bleeding: No Stroke: No., '[ ]'$  Minor, '[ ]'$  Major  Discharge medications: Statin use:  Yes If No: '[ ]'$  For Medical reasons, '[ ]'$  Non-compliant, '[ ]'$  Not-indicated ASA use:  Yes  If No: '[ ]'$  For Medical reasons, '[ ]'$  Non-compliant, '[ ]'$  Not-indicated Plavix use:  No If No: '[ ]'$  For  Medical reasons, '[ ]'$  Non-compliant, '[ ]'$  Not-indicated Beta blocker use:  Yes If No: '[ ]'$  For Medical reasons, '[ ]'$  Non-compliant, '[ ]'$  Not-indicated

## 2015-11-15 NOTE — Progress Notes (Signed)
Discharged home, self care, with family in good condition. Vital signs stable.

## 2015-11-15 NOTE — Progress Notes (Addendum)
  Progress Note    11/15/2015 7:16 AM 1 Day Post-Op  Subjective:  No complaints; says he doesn't have any real pain, only a pressure sensation in his groins  Afebrile HR  40's-50's SB 84'X-282'K systolic 813% RA  Vitals:   11/14/15 2246 11/15/15 0313  BP: 105/68 98/60  Pulse: (!) 49 (!) 54  Resp: (!) 22 15  Temp: 97.5 F (36.4 C) 98.3 F (36.8 C)    Physical Exam: Cardiac:  Regular/brady Lungs:  Non labored Incisions:  Bilateral groins are soft without hematoma; ecchymosis present on right groin Extremities:  +palpable DP pulses bilaterally Abdomen:  Soft, NT/ND  CBC    Component Value Date/Time   WBC 7.6 11/15/2015 0440   RBC 3.95 (L) 11/15/2015 0440   HGB 12.7 (L) 11/15/2015 0440   HCT 37.5 (L) 11/15/2015 0440   PLT 103 (L) 11/15/2015 0440   MCV 94.9 11/15/2015 0440   MCH 32.2 11/15/2015 0440   MCHC 33.9 11/15/2015 0440   RDW 12.9 11/15/2015 0440    BMET    Component Value Date/Time   NA 136 11/15/2015 0440   K 4.2 11/15/2015 0440   CL 107 11/15/2015 0440   CO2 23 11/15/2015 0440   GLUCOSE 90 11/15/2015 0440   BUN 9 11/15/2015 0440   CREATININE 0.93 11/15/2015 0440   CALCIUM 8.0 (L) 11/15/2015 0440   GFRNONAA >60 11/15/2015 0440   GFRAA >60 11/15/2015 0440    INR    Component Value Date/Time   INR 1.20 11/14/2015 1200     Intake/Output Summary (Last 24 hours) at 11/15/15 0716 Last data filed at 11/15/15 0316  Gross per 24 hour  Intake             1660 ml  Output             3130 ml  Net            -1470 ml     Assessment:  70 y.o. male is s/p:  Gore Excluder Stent graft repair of infrarenal AAA  1 Day Post-Op  Plan: -pt doing well this am.  He has ambulated and has been able to void since foley removal.   -bilateral groins are soft without hematoma -discharge home later this morning and f/u with Dr. Oneida Alar in 4 weeks with CTA    Leontine Locket, PA-C [facility] [phone] 11/15/2015 7:16 AM  Agree with  above PT pulses 2+ no groin hematoma D/c home  Ruta Hinds, MD [facility] [place]: [phone] Pager: [phone]

## 2015-11-15 NOTE — Telephone Encounter (Signed)
Spoke to pt about appts CT on 11/20 and post op 11/30

## 2015-11-16 DIAGNOSIS — Z7982 Long term (current) use of aspirin: Secondary | ICD-10-CM | POA: Diagnosis not present

## 2015-11-16 DIAGNOSIS — H409 Unspecified glaucoma: Secondary | ICD-10-CM | POA: Diagnosis not present

## 2015-11-16 DIAGNOSIS — Z48812 Encounter for surgical aftercare following surgery on the circulatory system: Secondary | ICD-10-CM | POA: Diagnosis not present

## 2015-11-16 DIAGNOSIS — I10 Essential (primary) hypertension: Secondary | ICD-10-CM | POA: Diagnosis not present

## 2015-11-16 DIAGNOSIS — Z955 Presence of coronary angioplasty implant and graft: Secondary | ICD-10-CM | POA: Diagnosis not present

## 2015-11-16 DIAGNOSIS — E785 Hyperlipidemia, unspecified: Secondary | ICD-10-CM | POA: Diagnosis not present

## 2015-11-16 DIAGNOSIS — I2510 Atherosclerotic heart disease of native coronary artery without angina pectoris: Secondary | ICD-10-CM | POA: Diagnosis not present

## 2015-11-16 DIAGNOSIS — I714 Abdominal aortic aneurysm, without rupture: Secondary | ICD-10-CM | POA: Diagnosis not present

## 2015-11-16 DIAGNOSIS — I252 Old myocardial infarction: Secondary | ICD-10-CM | POA: Diagnosis not present

## 2015-11-17 DIAGNOSIS — I10 Essential (primary) hypertension: Secondary | ICD-10-CM | POA: Diagnosis not present

## 2015-11-17 DIAGNOSIS — H409 Unspecified glaucoma: Secondary | ICD-10-CM | POA: Diagnosis not present

## 2015-11-17 DIAGNOSIS — I2510 Atherosclerotic heart disease of native coronary artery without angina pectoris: Secondary | ICD-10-CM | POA: Diagnosis not present

## 2015-11-17 DIAGNOSIS — I714 Abdominal aortic aneurysm, without rupture: Secondary | ICD-10-CM | POA: Diagnosis not present

## 2015-11-17 DIAGNOSIS — Z48812 Encounter for surgical aftercare following surgery on the circulatory system: Secondary | ICD-10-CM | POA: Diagnosis not present

## 2015-11-17 DIAGNOSIS — E785 Hyperlipidemia, unspecified: Secondary | ICD-10-CM | POA: Diagnosis not present

## 2015-11-18 DIAGNOSIS — Z48812 Encounter for surgical aftercare following surgery on the circulatory system: Secondary | ICD-10-CM | POA: Diagnosis not present

## 2015-11-18 DIAGNOSIS — H409 Unspecified glaucoma: Secondary | ICD-10-CM | POA: Diagnosis not present

## 2015-11-18 DIAGNOSIS — E785 Hyperlipidemia, unspecified: Secondary | ICD-10-CM | POA: Diagnosis not present

## 2015-11-18 DIAGNOSIS — I10 Essential (primary) hypertension: Secondary | ICD-10-CM | POA: Diagnosis not present

## 2015-11-18 DIAGNOSIS — I714 Abdominal aortic aneurysm, without rupture: Secondary | ICD-10-CM | POA: Diagnosis not present

## 2015-11-18 DIAGNOSIS — I2510 Atherosclerotic heart disease of native coronary artery without angina pectoris: Secondary | ICD-10-CM | POA: Diagnosis not present

## 2015-11-21 DIAGNOSIS — I2510 Atherosclerotic heart disease of native coronary artery without angina pectoris: Secondary | ICD-10-CM | POA: Diagnosis not present

## 2015-11-21 DIAGNOSIS — I10 Essential (primary) hypertension: Secondary | ICD-10-CM | POA: Diagnosis not present

## 2015-11-21 DIAGNOSIS — I714 Abdominal aortic aneurysm, without rupture: Secondary | ICD-10-CM | POA: Diagnosis not present

## 2015-11-21 DIAGNOSIS — H409 Unspecified glaucoma: Secondary | ICD-10-CM | POA: Diagnosis not present

## 2015-11-21 DIAGNOSIS — E785 Hyperlipidemia, unspecified: Secondary | ICD-10-CM | POA: Diagnosis not present

## 2015-11-21 DIAGNOSIS — Z48812 Encounter for surgical aftercare following surgery on the circulatory system: Secondary | ICD-10-CM | POA: Diagnosis not present

## 2015-11-22 ENCOUNTER — Telehealth: Payer: Self-pay

## 2015-11-22 NOTE — Telephone Encounter (Signed)
Assuming you let pt know about the appt?

## 2015-11-22 NOTE — Telephone Encounter (Signed)
Phone call from pt. Re: changes in right groin for 3 days.  Reported he noted a quarter sized firm, raised, tender area of right groin.  Reported there is a "new" bruised area above the groin that is "purple", and it extends over to the genital region.  Reported the bruising beneath the groin incision has improved.  Denied redness or warmth. Denied any drainage.  Appt. given at 9:45 AM 10/25, with NP for groin check.  Informed that Dr. Oneida Alar will be in office for consultation.  Pt. Agreed.

## 2015-11-23 ENCOUNTER — Ambulatory Visit (INDEPENDENT_AMBULATORY_CARE_PROVIDER_SITE_OTHER): Payer: Medicare Other | Admitting: Family

## 2015-11-23 ENCOUNTER — Encounter: Payer: Self-pay | Admitting: Family

## 2015-11-23 VITALS — BP 102/70 | HR 55 | Temp 97.4°F | Resp 20 | Ht 68.0 in | Wt 169.0 lb

## 2015-11-23 DIAGNOSIS — Z95828 Presence of other vascular implants and grafts: Secondary | ICD-10-CM

## 2015-11-23 DIAGNOSIS — I714 Abdominal aortic aneurysm, without rupture, unspecified: Secondary | ICD-10-CM

## 2015-11-23 NOTE — Progress Notes (Signed)
    Post-operative EVAR   History of Present Illness  Riley Sharp is a 70 y.o. male who presents post-op s/p EVAR (Date: 11/14/15) by Dr. Oneida Alar.    He returns today with c/o changes in right groin for 4 days.  Reported he noted a quarter sized firm, raised, tender area of right groin.  Reported there is a "new" bruised area above the groin that is "purple", and it extends over to the genital region.  Reported the bruising beneath the groin incision has improved.  Denied redness or warmth. Denied any drainage    Past Medical History, Past Surgical History, Social History, Family History, Medications, Allergies, and Review of Systems are unchanged from previous evaluation on 11/03/15.  For VQI Use Only  PRE-ADM LIVING: Home  AMB STATUS: Ambulatory  Physical Examination  Vitals:   11/23/15 0937  BP: 102/70  Pulse: (!) 55  Resp: 20  Temp: 97.4 F (36.3 C)  TempSrc: Oral  SpO2: 100%  Weight: 169 lb (76.7 kg)  Height: '5\' 8"'$  (1.727 m)   Body mass index is 25.7 kg/m.  Vascular: Vessel Right Left  Aorta  Non-palpable N/A  Femoral not Palpable  Palpable  Popliteal 2+ Non-palpable 1+ Non-palpable  PT  Palpable  Palpable  DP  Palpable  Palpable   Gastrointestinal: soft, NTND, -G/R, - HSM, - masses, - CVAT B Resolving ecchymosis proximal and distal to right groin. Small hard hematoma at healing right groin incision.    Medical Decision Making  Riley Sharp is a 70 y.o. male who presents s/p EVAR.   Dr. Oneida Alar spoke with and examined pt.  Resolving ecchymosis proximal and distal to right groin. Small hard hematoma at healing right groin incision.   Follow up as already scheduled with Dr. Oneida Alar about the end of November 2017.   Thank you for allowing [place] to participate in this patient's care.  Riley Sharp, Sharmon Leyden, RN, MSN, FNP-C [facility] [place] Office: [phone]  11/23/2015, 9:42 AM  Clinic MD: Oneida Alar

## 2015-11-24 DIAGNOSIS — I10 Essential (primary) hypertension: Secondary | ICD-10-CM | POA: Diagnosis not present

## 2015-11-24 DIAGNOSIS — Z48812 Encounter for surgical aftercare following surgery on the circulatory system: Secondary | ICD-10-CM | POA: Diagnosis not present

## 2015-11-24 DIAGNOSIS — E785 Hyperlipidemia, unspecified: Secondary | ICD-10-CM | POA: Diagnosis not present

## 2015-11-24 DIAGNOSIS — I714 Abdominal aortic aneurysm, without rupture: Secondary | ICD-10-CM | POA: Diagnosis not present

## 2015-11-24 DIAGNOSIS — I2510 Atherosclerotic heart disease of native coronary artery without angina pectoris: Secondary | ICD-10-CM | POA: Diagnosis not present

## 2015-11-24 DIAGNOSIS — H409 Unspecified glaucoma: Secondary | ICD-10-CM | POA: Diagnosis not present

## 2015-11-28 DIAGNOSIS — I714 Abdominal aortic aneurysm, without rupture: Secondary | ICD-10-CM | POA: Diagnosis not present

## 2015-11-28 DIAGNOSIS — I2510 Atherosclerotic heart disease of native coronary artery without angina pectoris: Secondary | ICD-10-CM | POA: Diagnosis not present

## 2015-11-28 DIAGNOSIS — Z48812 Encounter for surgical aftercare following surgery on the circulatory system: Secondary | ICD-10-CM | POA: Diagnosis not present

## 2015-11-28 DIAGNOSIS — E785 Hyperlipidemia, unspecified: Secondary | ICD-10-CM | POA: Diagnosis not present

## 2015-11-28 DIAGNOSIS — H409 Unspecified glaucoma: Secondary | ICD-10-CM | POA: Diagnosis not present

## 2015-11-28 DIAGNOSIS — I10 Essential (primary) hypertension: Secondary | ICD-10-CM | POA: Diagnosis not present

## 2015-11-30 DIAGNOSIS — Z48812 Encounter for surgical aftercare following surgery on the circulatory system: Secondary | ICD-10-CM | POA: Diagnosis not present

## 2015-11-30 DIAGNOSIS — I10 Essential (primary) hypertension: Secondary | ICD-10-CM | POA: Diagnosis not present

## 2015-11-30 DIAGNOSIS — H409 Unspecified glaucoma: Secondary | ICD-10-CM | POA: Diagnosis not present

## 2015-11-30 DIAGNOSIS — I2510 Atherosclerotic heart disease of native coronary artery without angina pectoris: Secondary | ICD-10-CM | POA: Diagnosis not present

## 2015-11-30 DIAGNOSIS — E785 Hyperlipidemia, unspecified: Secondary | ICD-10-CM | POA: Diagnosis not present

## 2015-11-30 DIAGNOSIS — I714 Abdominal aortic aneurysm, without rupture: Secondary | ICD-10-CM | POA: Diagnosis not present

## 2015-12-06 DIAGNOSIS — E785 Hyperlipidemia, unspecified: Secondary | ICD-10-CM | POA: Diagnosis not present

## 2015-12-06 DIAGNOSIS — I714 Abdominal aortic aneurysm, without rupture: Secondary | ICD-10-CM | POA: Diagnosis not present

## 2015-12-06 DIAGNOSIS — H409 Unspecified glaucoma: Secondary | ICD-10-CM | POA: Diagnosis not present

## 2015-12-06 DIAGNOSIS — Z48812 Encounter for surgical aftercare following surgery on the circulatory system: Secondary | ICD-10-CM | POA: Diagnosis not present

## 2015-12-06 DIAGNOSIS — I10 Essential (primary) hypertension: Secondary | ICD-10-CM | POA: Diagnosis not present

## 2015-12-06 DIAGNOSIS — I2510 Atherosclerotic heart disease of native coronary artery without angina pectoris: Secondary | ICD-10-CM | POA: Diagnosis not present

## 2015-12-08 DIAGNOSIS — E785 Hyperlipidemia, unspecified: Secondary | ICD-10-CM | POA: Diagnosis not present

## 2015-12-08 DIAGNOSIS — I2510 Atherosclerotic heart disease of native coronary artery without angina pectoris: Secondary | ICD-10-CM | POA: Diagnosis not present

## 2015-12-08 DIAGNOSIS — Z48812 Encounter for surgical aftercare following surgery on the circulatory system: Secondary | ICD-10-CM | POA: Diagnosis not present

## 2015-12-08 DIAGNOSIS — H409 Unspecified glaucoma: Secondary | ICD-10-CM | POA: Diagnosis not present

## 2015-12-08 DIAGNOSIS — I10 Essential (primary) hypertension: Secondary | ICD-10-CM | POA: Diagnosis not present

## 2015-12-08 DIAGNOSIS — I714 Abdominal aortic aneurysm, without rupture: Secondary | ICD-10-CM | POA: Diagnosis not present

## 2015-12-19 ENCOUNTER — Ambulatory Visit
Admit: 2015-12-19 | Discharge: 2015-12-19 | Disposition: A | Discharge status: Home / Self Care | Payer: Medicare Other | Attending: Vascular Surgery | Admitting: Vascular Surgery

## 2015-12-19 DIAGNOSIS — I714 Abdominal aortic aneurysm, without rupture, unspecified: Secondary | ICD-10-CM

## 2015-12-19 DIAGNOSIS — Z48812 Encounter for surgical aftercare following surgery on the circulatory system: Secondary | ICD-10-CM

## 2015-12-19 MED ORDER — IOPAMIDOL (ISOVUE-370) INJECTION 76%
100.0000 mL | Freq: Once | INTRAVENOUS | Status: AC | PRN
  Administered 2015-12-19: 100 mL via INTRAVENOUS

## 2015-12-21 ENCOUNTER — Encounter: Payer: Self-pay | Admitting: Vascular Surgery

## 2015-12-29 ENCOUNTER — Encounter: Payer: Self-pay | Admitting: Vascular Surgery

## 2015-12-29 ENCOUNTER — Ambulatory Visit (INDEPENDENT_AMBULATORY_CARE_PROVIDER_SITE_OTHER): Payer: Medicare Other | Admitting: Vascular Surgery

## 2015-12-29 VITALS — BP 131/69 | HR 57 | Temp 97.8°F | Resp 20 | Ht 68.0 in | Wt 171.0 lb

## 2015-12-29 DIAGNOSIS — I714 Abdominal aortic aneurysm, without rupture, unspecified: Secondary | ICD-10-CM

## 2015-12-29 NOTE — Progress Notes (Signed)
[facility] [place] HISTORY AND PHYSICAL    History of Present Illness:  Patient is a 70 y.o. y.o. male who presents for follow-up evaluation of AAA. He underwent Gore Excluder aneurysm stent graft repair 11/03/2015. We intentionally covered his right internal iliac artery which had a greater than 90% stenosis at the origin. Preoperatively aneurysm diameter was 5.3 cm. He does currently complain of some right buttock claudication after walking about a block. He states he did have some of this preoperatively. Is unchanged.. The patient denies new abdominal or back pain.  Other chronic medical problems include coronary artery disease hyperlipidemia hypertension all of which are currently stable.  Past Medical History:  Diagnosis Date  . AAA (abdominal aortic aneurysm) ([place])   . CAD (coronary artery disease)   . Cancer ([place])    basal cell carcimona right ear  . Glaucoma   . Hyperlipidemia   . Hypertension   . Myocardial infarction 03/2003  . Wears glasses      Past Surgical History:  Procedure Laterality Date  . ABDOMINAL AORTIC ENDOVASCULAR STENT GRAFT N/A 11/14/2015   Procedure: ABDOMINAL AORTIC ENDOVASCULAR STENT GRAFT;  Surgeon: Elam Dutch, MD;  Location: [facility];  Service: Vascular;  Laterality: N/A;  . APPENDECTOMY    . CHOLECYSTECTOMY  2007   Gall Bladder  . COLONOSCOPY W/ BIOPSIES AND POLYPECTOMY    . EYE SURGERY     laser for glaucoma both eyesn x 2  . heart stent  Feb. 14, 2005       Review of Systems:  Neurologic: denies symptoms of TIA, amaurosis, or stroke Cardiac:denies shortness of breath or chest pain Pulmonary: denies cough or wheeze Abdomen: denies abdominal pain nausea or vomiting  Social History   Social History  . Marital status: Single    Spouse name: N/A  . Number of children: N/A  . Years of education: N/A   Occupational History  . Not on file.   Social History Main Topics  . Smoking status: Current Some Day Smoker   Packs/day: 2.00    Years: 45.00    Types: Cigars  . Smokeless tobacco: Never Used     Comment: No cigars since 11/14/15  . Alcohol use 0.0 oz/week     Comment: rare  . Drug use: No  . Sexual activity: Not on file   Other Topics Concern  . Not on file   Social History Narrative  . No narrative on file    Allergies  Allergen Reactions  . No Known Allergies     Current Outpatient Prescriptions on File Prior to Visit  Medication Sig Dispense Refill  . aspirin EC 81 MG tablet Take 81 mg by mouth daily.      . brimonidine (ALPHAGAN) 0.2 % ophthalmic solution Place 1 drop into both eyes 3 (three) times daily.     . dorzolamide (TRUSOPT) 2 % ophthalmic solution Place 1 drop into both eyes 3 (three) times daily.    Marland Kitchen latanoprost (XALATAN) 0.005 % ophthalmic solution Place 1 drop into both eyes daily.     . metoprolol succinate (TOPROL-XL) 25 MG 24 hr tablet Take 25 mg by mouth daily.      . ramipril (ALTACE) 5 MG tablet Take 5 mg by mouth daily.      . rosuvastatin (CRESTOR) 10 MG tablet Take 10 mg by mouth daily.    . timolol (BETIMOL) 0.5 % ophthalmic solution Place 1 drop into both eyes daily.      Marland Kitchen  oxyCODONE-acetaminophen (PERCOCET/ROXICET) 5-325 MG tablet Take 1 tablet by mouth every 6 (six) hours as needed for moderate pain. (Patient not taking: Reported on 12/29/2015) 5 tablet 0   No current facility-administered medications on file prior to visit.        Physical Examination    Vitals:   12/29/15 0946  BP: 131/69  Pulse: (!) 57  Resp: 20  Temp: 97.8 F (36.6 C)  TempSrc: Oral  SpO2: 98%  Weight: 171 lb (77.6 kg)  Height: '5\' 8"'$  (1.727 m)     General:  Alert and oriented, no acute distress HEENT: Normal Neck: No bruit or JVD Pulmonary: Clear to auscultation bilaterally Cardiac: Regular Rate and Rhythm without murmur Abdomen: Soft, non-tender, non-distended, normal bowel sounds, no pulsatile mass Extremities: 2+ femoral pulses   DATA:  CT angiogram the  abdomen and pelvis images were reviewed today. Current aneurysm diameter is 5.2.  There Is no  evidence of endoleak. The top portion of the stent graft is adjacent to the renal arteries and there is no evidence of migration.   ASSESSMENT:  Doing well status post Gore Excluder stent graft repair aneurysm, some buttock claudication which should improve with time and ambulation. Findings were discussed with the patient today   PLAN: Patient will return in 5 months to review his stent graft. He will have a CT angiogram at that time.  Ruta Hinds, MD [facility] [place] Office: [phone] Pager: [phone]

## 2016-01-05 DIAGNOSIS — D126 Benign neoplasm of colon, unspecified: Secondary | ICD-10-CM | POA: Diagnosis not present

## 2016-01-05 DIAGNOSIS — Z86010 Personal history of colonic polyps: Secondary | ICD-10-CM | POA: Diagnosis not present

## 2016-01-05 DIAGNOSIS — K648 Other hemorrhoids: Secondary | ICD-10-CM | POA: Diagnosis not present

## 2016-01-05 DIAGNOSIS — D124 Benign neoplasm of descending colon: Secondary | ICD-10-CM | POA: Diagnosis not present

## 2016-01-05 DIAGNOSIS — D122 Benign neoplasm of ascending colon: Secondary | ICD-10-CM | POA: Diagnosis not present

## 2016-01-05 DIAGNOSIS — K5730 Diverticulosis of large intestine without perforation or abscess without bleeding: Secondary | ICD-10-CM | POA: Diagnosis not present

## 2016-01-05 DIAGNOSIS — K644 Residual hemorrhoidal skin tags: Secondary | ICD-10-CM | POA: Diagnosis not present

## 2016-01-10 DIAGNOSIS — D126 Benign neoplasm of colon, unspecified: Secondary | ICD-10-CM | POA: Diagnosis not present

## 2016-03-01 DIAGNOSIS — H2513 Age-related nuclear cataract, bilateral: Secondary | ICD-10-CM | POA: Diagnosis not present

## 2016-03-01 DIAGNOSIS — H401221 Low-tension glaucoma, left eye, mild stage: Secondary | ICD-10-CM | POA: Diagnosis not present

## 2016-03-01 DIAGNOSIS — H401212 Low-tension glaucoma, right eye, moderate stage: Secondary | ICD-10-CM | POA: Diagnosis not present

## 2016-03-08 DIAGNOSIS — C44519 Basal cell carcinoma of skin of other part of trunk: Secondary | ICD-10-CM | POA: Diagnosis not present

## 2016-03-08 DIAGNOSIS — L821 Other seborrheic keratosis: Secondary | ICD-10-CM | POA: Diagnosis not present

## 2016-03-08 DIAGNOSIS — L281 Prurigo nodularis: Secondary | ICD-10-CM | POA: Diagnosis not present

## 2016-03-08 DIAGNOSIS — D485 Neoplasm of uncertain behavior of skin: Secondary | ICD-10-CM | POA: Diagnosis not present

## 2016-03-12 DIAGNOSIS — H401221 Low-tension glaucoma, left eye, mild stage: Secondary | ICD-10-CM | POA: Diagnosis not present

## 2016-03-12 DIAGNOSIS — H02423 Myogenic ptosis of bilateral eyelids: Secondary | ICD-10-CM | POA: Diagnosis not present

## 2016-03-12 DIAGNOSIS — H401212 Low-tension glaucoma, right eye, moderate stage: Secondary | ICD-10-CM | POA: Diagnosis not present

## 2016-03-12 DIAGNOSIS — H0259 Other disorders affecting eyelid function: Secondary | ICD-10-CM | POA: Diagnosis not present

## 2016-03-22 DIAGNOSIS — C44519 Basal cell carcinoma of skin of other part of trunk: Secondary | ICD-10-CM | POA: Diagnosis not present

## 2016-03-22 DIAGNOSIS — L820 Inflamed seborrheic keratosis: Secondary | ICD-10-CM | POA: Diagnosis not present

## 2016-04-05 ENCOUNTER — Other Ambulatory Visit: Payer: Self-pay

## 2016-04-05 DIAGNOSIS — I714 Abdominal aortic aneurysm, without rupture, unspecified: Secondary | ICD-10-CM

## 2016-04-06 DIAGNOSIS — H401212 Low-tension glaucoma, right eye, moderate stage: Secondary | ICD-10-CM | POA: Diagnosis not present

## 2016-05-18 DIAGNOSIS — H2513 Age-related nuclear cataract, bilateral: Secondary | ICD-10-CM | POA: Diagnosis not present

## 2016-05-18 DIAGNOSIS — H401221 Low-tension glaucoma, left eye, mild stage: Secondary | ICD-10-CM | POA: Diagnosis not present

## 2016-05-18 DIAGNOSIS — H401212 Low-tension glaucoma, right eye, moderate stage: Secondary | ICD-10-CM | POA: Diagnosis not present

## 2016-05-23 ENCOUNTER — Encounter: Payer: Self-pay | Admitting: Vascular Surgery

## 2016-05-25 ENCOUNTER — Ambulatory Visit
Admission: RE | Admit: 2016-05-25 | Discharge: 2016-05-25 | Disposition: A | Discharge status: Home / Self Care | Payer: Medicare Other | Source: Ambulatory Visit | Attending: Vascular Surgery | Admitting: Vascular Surgery

## 2016-05-25 DIAGNOSIS — I714 Abdominal aortic aneurysm, without rupture, unspecified: Secondary | ICD-10-CM

## 2016-05-25 MED ORDER — IOPAMIDOL (ISOVUE-370) INJECTION 76%
75.0000 mL | Freq: Once | INTRAVENOUS | Status: AC | PRN
  Administered 2016-05-25: 75 mL via INTRAVENOUS

## 2016-05-31 ENCOUNTER — Ambulatory Visit (INDEPENDENT_AMBULATORY_CARE_PROVIDER_SITE_OTHER): Payer: Medicare Other | Admitting: Vascular Surgery

## 2016-05-31 ENCOUNTER — Encounter: Payer: Self-pay | Admitting: Vascular Surgery

## 2016-05-31 VITALS — BP 120/75 | HR 70 | Temp 98.5°F | Resp 20 | Ht 68.0 in | Wt 168.0 lb

## 2016-05-31 DIAGNOSIS — I714 Abdominal aortic aneurysm, without rupture, unspecified: Secondary | ICD-10-CM

## 2016-05-31 NOTE — Progress Notes (Signed)
Patient is a 71 year old male who presents for follow-up after recent Gore Excluder stent graft repair of abdominal aortic aneurysm. This was performed on 11/03/2015. We did intentionally covered the right internal iliac artery to exclude and iliac aneurysm. He had some buttock claudication symptoms at his last office visit in October. Abdominal aortic aneurysm diameter was previously 5.2 cm. He also has a known left common iliac artery aneurysm which is about 2 cm in diameter. He states that he is still having some buttock claudication symptoms. However, he admits that he has not really been compliant with a walking program previously prescribed. He is on aspirin and statin.  Review of systems: Denies shortness of breath. He denies chest pain.  Current Outpatient Prescriptions on File Prior to Visit  Medication Sig Dispense Refill  . aspirin EC 81 MG tablet Take 81 mg by mouth daily.      . brimonidine (ALPHAGAN) 0.2 % ophthalmic solution Place 1 drop into both eyes 3 (three) times daily.     . dorzolamide (TRUSOPT) 2 % ophthalmic solution Place 1 drop into both eyes 3 (three) times daily.    Marland Kitchen latanoprost (XALATAN) 0.005 % ophthalmic solution Place 1 drop into both eyes daily.     . metoprolol succinate (TOPROL-XL) 25 MG 24 hr tablet Take 25 mg by mouth daily.      . ramipril (ALTACE) 5 MG tablet Take 5 mg by mouth daily.      . rosuvastatin (CRESTOR) 10 MG tablet Take 10 mg by mouth daily.    . timolol (BETIMOL) 0.5 % ophthalmic solution Place 1 drop into both eyes daily.       No current facility-administered medications on file prior to visit.      Physical exam:  Vitals:   05/31/16 0916  BP: 120/75  Pulse: 70  Resp: 20  Temp: 98.5 F (36.9 C)  TempSrc: Oral  SpO2: 96%  Weight: 168 lb (76.2 kg)  Height: '5\' 8"'$  (1.727 m)    Extremities: 2+ posterior tibial pulses bilaterally  Neck: No carotid bruits  Chest: Clear to auscultation bilaterally  Cardiac: Regular rate and rhythm  without murmur  Abdomen: Soft nontender no pulsatile mass  Data: CT angiogram dated 05/25/2016 is reviewed today. This shows occlusion of the proximal aspect of the right internal iliac artery with reconstitution distally but no flow around stent graft. The aneurysm was well excluded with no evidence of endoleak. There is a 2 cm left common iliac aneurysm.  Assessment: Well excluded abdominal aortic aneurysm status post stent graft repair.  Plan: The patient will need repeat aortic ultrasound in 6 months time. He will try to continue a walking program to improve his buttock claudication. We will need to continue to watch the left common iliac aneurysm and at this reaches 3 cm in diameter he would need repair of this.  Ruta Hinds, MD [facility] [place] Office: [phone] Pager: [phone]

## 2016-06-04 NOTE — Addendum Note (Signed)
Addended by: Lianne Cure A on: 06/04/2016 04:49 PM   Modules accepted: Orders

## 2016-06-12 DIAGNOSIS — H02423 Myogenic ptosis of bilateral eyelids: Secondary | ICD-10-CM | POA: Diagnosis not present

## 2016-06-12 DIAGNOSIS — H0259 Other disorders affecting eyelid function: Secondary | ICD-10-CM | POA: Diagnosis not present

## 2016-07-02 DIAGNOSIS — E782 Mixed hyperlipidemia: Secondary | ICD-10-CM | POA: Diagnosis not present

## 2016-07-02 DIAGNOSIS — R5382 Chronic fatigue, unspecified: Secondary | ICD-10-CM | POA: Diagnosis not present

## 2016-07-02 DIAGNOSIS — F17201 Nicotine dependence, unspecified, in remission: Secondary | ICD-10-CM | POA: Diagnosis not present

## 2016-07-02 DIAGNOSIS — I714 Abdominal aortic aneurysm, without rupture: Secondary | ICD-10-CM | POA: Diagnosis not present

## 2016-07-02 DIAGNOSIS — I10 Essential (primary) hypertension: Secondary | ICD-10-CM | POA: Diagnosis not present

## 2016-07-02 DIAGNOSIS — E559 Vitamin D deficiency, unspecified: Secondary | ICD-10-CM | POA: Diagnosis not present

## 2016-07-02 DIAGNOSIS — Z125 Encounter for screening for malignant neoplasm of prostate: Secondary | ICD-10-CM | POA: Diagnosis not present

## 2016-07-02 DIAGNOSIS — I2510 Atherosclerotic heart disease of native coronary artery without angina pectoris: Secondary | ICD-10-CM | POA: Diagnosis not present

## 2016-07-02 DIAGNOSIS — Z0000 Encounter for general adult medical examination without abnormal findings: Secondary | ICD-10-CM | POA: Diagnosis not present

## 2016-07-02 DIAGNOSIS — D7589 Other specified diseases of blood and blood-forming organs: Secondary | ICD-10-CM | POA: Diagnosis not present

## 2016-07-02 DIAGNOSIS — Z79899 Other long term (current) drug therapy: Secondary | ICD-10-CM | POA: Diagnosis not present

## 2016-07-26 DIAGNOSIS — Z85828 Personal history of other malignant neoplasm of skin: Secondary | ICD-10-CM | POA: Diagnosis not present

## 2016-07-26 DIAGNOSIS — Z08 Encounter for follow-up examination after completed treatment for malignant neoplasm: Secondary | ICD-10-CM | POA: Diagnosis not present

## 2016-09-18 DIAGNOSIS — H401212 Low-tension glaucoma, right eye, moderate stage: Secondary | ICD-10-CM | POA: Diagnosis not present

## 2016-09-18 DIAGNOSIS — H401221 Low-tension glaucoma, left eye, mild stage: Secondary | ICD-10-CM | POA: Diagnosis not present

## 2016-09-18 DIAGNOSIS — H2513 Age-related nuclear cataract, bilateral: Secondary | ICD-10-CM | POA: Diagnosis not present

## 2016-11-09 DIAGNOSIS — E7800 Pure hypercholesterolemia, unspecified: Secondary | ICD-10-CM | POA: Insufficient documentation

## 2016-11-14 DIAGNOSIS — I714 Abdominal aortic aneurysm, without rupture: Secondary | ICD-10-CM | POA: Diagnosis not present

## 2016-11-14 DIAGNOSIS — E7800 Pure hypercholesterolemia, unspecified: Secondary | ICD-10-CM | POA: Diagnosis not present

## 2016-11-14 DIAGNOSIS — I2510 Atherosclerotic heart disease of native coronary artery without angina pectoris: Secondary | ICD-10-CM | POA: Diagnosis not present

## 2016-11-14 DIAGNOSIS — F17210 Nicotine dependence, cigarettes, uncomplicated: Secondary | ICD-10-CM | POA: Diagnosis not present

## 2016-11-14 DIAGNOSIS — I252 Old myocardial infarction: Secondary | ICD-10-CM | POA: Diagnosis not present

## 2016-11-14 DIAGNOSIS — Z7982 Long term (current) use of aspirin: Secondary | ICD-10-CM | POA: Diagnosis not present

## 2016-11-14 DIAGNOSIS — I10 Essential (primary) hypertension: Secondary | ICD-10-CM | POA: Diagnosis not present

## 2016-12-06 ENCOUNTER — Encounter: Payer: Self-pay | Admitting: Family

## 2016-12-06 ENCOUNTER — Ambulatory Visit (INDEPENDENT_AMBULATORY_CARE_PROVIDER_SITE_OTHER): Payer: Medicare Other | Admitting: Family

## 2016-12-06 ENCOUNTER — Ambulatory Visit (HOSPITAL_COMMUNITY)
Admission: RE | Admit: 2016-12-06 | Discharge: 2016-12-06 | Disposition: A | Discharge status: Home / Self Care | Payer: Medicare Other | Source: Ambulatory Visit | Attending: Vascular Surgery | Admitting: Vascular Surgery

## 2016-12-06 VITALS — BP 131/75 | HR 58 | Temp 98.1°F | Resp 20 | Ht 68.0 in | Wt 166.5 lb

## 2016-12-06 DIAGNOSIS — F17200 Nicotine dependence, unspecified, uncomplicated: Secondary | ICD-10-CM

## 2016-12-06 DIAGNOSIS — Z95828 Presence of other vascular implants and grafts: Secondary | ICD-10-CM | POA: Diagnosis not present

## 2016-12-06 DIAGNOSIS — I714 Abdominal aortic aneurysm, without rupture, unspecified: Secondary | ICD-10-CM

## 2016-12-06 DIAGNOSIS — R0989 Other specified symptoms and signs involving the circulatory and respiratory systems: Secondary | ICD-10-CM | POA: Diagnosis not present

## 2016-12-06 NOTE — Patient Instructions (Signed)
Steps to Quit Smoking Smoking tobacco can be bad for your health. It can also affect almost every organ in your body. Smoking puts you and people around you at risk for many serious long-lasting (chronic) diseases. Quitting smoking is hard, but it is one of the best things that you can do for your health. It is never too late to quit. What are the benefits of quitting smoking? When you quit smoking, you lower your risk for getting serious diseases and conditions. They can include:  Lung cancer or lung disease.  Heart disease.  Stroke.  Heart attack.  Not being able to have children (infertility).  Weak bones (osteoporosis) and broken bones (fractures).  If you have coughing, wheezing, and shortness of breath, those symptoms may get better when you quit. You may also get sick less often. If you are pregnant, quitting smoking can help to lower your chances of having a baby of low birth weight. What can I do to help me quit smoking? Talk with your doctor about what can help you quit smoking. Some things you can do (strategies) include:  Quitting smoking totally, instead of slowly cutting back how much you smoke over a period of time.  Going to in-person counseling. You are more likely to quit if you go to many counseling sessions.  Using resources and support systems, such as: ? Online chats with a counselor. ? Phone quitlines. ? Printed self-help materials. ? Support groups or group counseling. ? Text messaging programs. ? Mobile phone apps or applications.  Taking medicines. Some of these medicines may have nicotine in them. If you are pregnant or breastfeeding, do not take any medicines to quit smoking unless your doctor says it is okay. Talk with your doctor about counseling or other things that can help you.  Talk with your doctor about using more than one strategy at the same time, such as taking medicines while you are also going to in-person counseling. This can help make  quitting easier. What things can I do to make it easier to quit? Quitting smoking might feel very hard at first, but there is a lot that you can do to make it easier. Take these steps:  Talk to your family and friends. Ask them to support and encourage you.  Call phone quitlines, reach out to support groups, or work with a counselor.  Ask people who smoke to not smoke around you.  Avoid places that make you want (trigger) to smoke, such as: ? Bars. ? Parties. ? Smoke-break areas at work.  Spend time with people who do not smoke.  Lower the stress in your life. Stress can make you want to smoke. Try these things to help your stress: ? Getting regular exercise. ? Deep-breathing exercises. ? Yoga. ? Meditating. ? Doing a body scan. To do this, close your eyes, focus on one area of your body at a time from head to toe, and notice which parts of your body are tense. Try to relax the muscles in those areas.  Download or buy apps on your mobile phone or tablet that can help you stick to your quit plan. There are many free apps, such as QuitGuide from the CDC (Centers for Disease Control and Prevention). You can find more support from smokefree.gov and other websites.  This information is not intended to replace advice given to you by your health care provider. Make sure you discuss any questions you have with your health care provider. Document Released: 11/11/2008 Document   Revised: 09/13/2015 Document Reviewed: 06/01/2014 Elsevier Interactive Patient Education  2018 Elsevier Inc.  

## 2016-12-06 NOTE — Progress Notes (Signed)
[facility]  CC: Follow up s/p Endovascular Repair of Abdominal Aortic Aneurysm    History of Present Illness  Riley Sharp is a 71 y.o. (1945-03-08) male who presents for routine follow up s/p EVAR (Date: 11/14/15) by Dr. Oneida Alar.  The right internal iliac artery was intentionally covered  to exclude an iliac aneurysm. He had some buttock claudication symptoms at his last office visit in October 2017. Abdominal aortic aneurysm diameter was previously 5.2 cm.  He also has a known left common iliac artery aneurysm which was about 2 cm in diameter.   Dr. Oneida Alar last evaluated pt on 05-31-16. At that time CT angiogram dated 05/25/2016 was reviewed.. This shows occlusion of the proximal aspect of the right internal iliac artery with reconstitution distally but no flow around stent graft. The aneurysm was well excluded with no evidence of endoleak. There is a 2 cm left common iliac aneurysm. Well excluded abdominal aortic aneurysm status post stent graft repair. The patient will need repeat aortic ultrasound in 6 months time. He will try to continue a walking program to improve his buttock claudication. We will need to continue to watch the left common iliac aneurysm and at this reaches 3 cm in diameter he would need repair of this.  After walking about 100 yards his right buttock hurts, relieved by short rest, denies problems with leg when he walks.   He has had a cold with lots of coughing for the last week, he developed left lower back and rib cage pain since the coughing started.   Pt Diabetic: No Pt smoker: smoker  (3 cigars/day, starting smoking cigarettes at age 19 yrs)   Past Medical History:  Diagnosis Date  . AAA (abdominal aortic aneurysm) ([place])   . CAD (coronary artery disease)   . Cancer ([place])    basal cell carcimona right ear  . Glaucoma   . Hyperlipidemia   . Hypertension   . Myocardial infarction ([place]) 03/2003  . Wears glasses    Past Surgical  History:  Procedure Laterality Date  . APPENDECTOMY    . CHOLECYSTECTOMY  2007   Gall Bladder  . COLONOSCOPY W/ BIOPSIES AND POLYPECTOMY    . EYE SURGERY     laser for glaucoma both eyesn x 2  . heart stent  Feb. 14, 2005   Social History Social History   Tobacco Use  . Smoking status: Current Some Day Smoker    Packs/day: 2.00    Years: 45.00    Pack years: 90.00    Types: Cigars  . Smokeless tobacco: Never Used  . Tobacco comment: 2-3 cigars per day  Substance Use Topics  . Alcohol use: Yes    Alcohol/week: 0.0 oz    Comment: rare  . Drug use: No   Family History Family History  Problem Relation Age of Onset  . Cancer Father   . Heart disease Father        After age 59  . Hyperlipidemia Father   . Hypertension Father   . Heart attack Father   . Heart disease Sister        After age 72  . Hyperlipidemia Sister   . Heart attack Sister   . Hypertension Other   . Heart disease Other   . Cancer Other    Current Outpatient Medications on File Prior to Visit  Medication Sig Dispense Refill  . aspirin EC 81 MG tablet Take 81 mg by mouth daily.      Marland Kitchen  brimonidine (ALPHAGAN) 0.2 % ophthalmic solution Place 1 drop into both eyes 3 (three) times daily.     . dorzolamide (TRUSOPT) 2 % ophthalmic solution Place 1 drop into both eyes 3 (three) times daily.    Marland Kitchen latanoprost (XALATAN) 0.005 % ophthalmic solution Place 1 drop into both eyes daily.     . metoprolol succinate (TOPROL-XL) 25 MG 24 hr tablet Take 25 mg by mouth daily.      . ramipril (ALTACE) 5 MG tablet Take 5 mg by mouth daily.      . rosuvastatin (CRESTOR) 10 MG tablet Take 10 mg by mouth daily.    . timolol (BETIMOL) 0.5 % ophthalmic solution Place 1 drop into both eyes daily.       No current facility-administered medications on file prior to visit.    Allergies  Allergen Reactions  . No Known Allergies      ROS: See HPI for pertinent positives and negatives.  Physical Examination  Vitals:    12/06/16 0835  BP: 131/75  Pulse: (!) 58  Resp: 20  Temp: 98.1 F (36.7 C)  TempSrc: Oral  SpO2: 93%  Weight: 166 lb 8 oz (75.5 kg)  Height: 5\' 8"  (1.727 m)   Body mass index is 25.32 kg/m.  General: A&O x 3, well nourished male.   Pulmonary: Sym exp, respirations are non labored, fair air movt, CTAB, no rales, rhonchi, or wheezing, occasional dry cough.   Cardiac: RRR, Nl S1, S2, no murmur appreciated  Vascular: Vessel Right Left  Radial 2+Palpable 2+Palpable  Carotid  without bruit  without bruit  Aorta Not palpable N/A  Femoral 2+Palpable 2+Palpable  Popliteal Not palpable Not palpable  PT Not Palpable 2+Palpable  DP Not Palpable Not Palpable   Gastrointestinal: soft, NTND, -G/R, - HSM, - palpable masses, - CVAT B.  Musculoskeletal: M/S 5/5 throughout, extremities without ischemic changes.  Skin: No rashes, no ulcers, no cellulitis.    Neurologic: Pain and light touch intact in extremities, Motor exam as listed above.    DATA  EVAR Duplex (Date: 12/06/16):  AAA sac size: 5.5 cm; Right CIA: 3.2 cm, Left CIA: 1.9 cm  no endoleak detected  CTA Abd/Pelvis Duplex (Date: 05-25-16) Inflow: The right iliac stent graft excludes a common iliac artery aneurysm and extends to the right external iliac artery. Maximal diameter of the excluded common iliac artery aneurysm is 2.6 cm. This is stable. There is mild narrowing just beyond the landing zone in the proximal external iliac artery. There is a subtle dissection flap just beyond the landing zone but this does not appear flow Limiting.  Left iliac stent graft landing zone occurs in the distal common iliac artery. Native common iliac artery remains aneurysmal with some circumferential chronic mural thrombus. Maximal diameter is 2.1 cm. This is increased compared with 1.8 cm on the prior study. The internal iliac artery remains ectatic. The left external iliac artery is diminutive and patent. Stable aorto bi-iliac  stent graft excluding an aortic and right common iliac artery aneurysms. Aneurysm sac diameters are stable. Maximal aortic sac diameter is 5.2 cm. There is no evidence of endoleak. Left common iliac artery aneurysm has increased from 1.8 cm to 2.1 cm.   Medical Decision Making  CARLSON BELLAND is a 71 y.o. male who presents s/p EVAR (Date: 11/14/15).  Pt is asymptomatic with stable sac size.  I discussed with Dr. Oneida Alar the 3.2 cm right CIA as measured today; we reviewed CTA abd/pelvis results from April  2018, maximal diameter of the excluded common iliac artery aneurysm is 2.6 cm.  I discussed with the patient the importance of surveillance of the endograft.  The next endograft duplex will be scheduled for 12 months, will also check ABI's (decreased pedal pulses, claudication right buttock).  The patient will follow up with [place] in 12 months with these studies.  I emphasized the importance of maximal medical management including strict control of blood pressure, blood glucose, and lipid levels, antiplatelet agents, obtaining regular exercise, and cessation of smoking.   Thank you for allowing [place] to participate in this patient's care.   Clemon Chambers, RN, MSN, FNP-C [facility] [place] Office: [phone]  Clinic Physician: Oneida Alar  12/06/2016, 9:15 AM

## 2016-12-13 NOTE — Addendum Note (Signed)
Addended by: Lianne Cure A on: 12/13/2016 02:20 PM   Modules accepted: Orders

## 2017-01-24 DIAGNOSIS — H2513 Age-related nuclear cataract, bilateral: Secondary | ICD-10-CM | POA: Diagnosis not present

## 2017-01-24 DIAGNOSIS — H401212 Low-tension glaucoma, right eye, moderate stage: Secondary | ICD-10-CM | POA: Diagnosis not present

## 2017-01-24 DIAGNOSIS — H401221 Low-tension glaucoma, left eye, mild stage: Secondary | ICD-10-CM | POA: Diagnosis not present

## 2017-02-07 DIAGNOSIS — L570 Actinic keratosis: Secondary | ICD-10-CM | POA: Diagnosis not present

## 2017-02-07 DIAGNOSIS — L820 Inflamed seborrheic keratosis: Secondary | ICD-10-CM | POA: Diagnosis not present

## 2017-02-07 DIAGNOSIS — Z08 Encounter for follow-up examination after completed treatment for malignant neoplasm: Secondary | ICD-10-CM | POA: Diagnosis not present

## 2017-02-07 DIAGNOSIS — L281 Prurigo nodularis: Secondary | ICD-10-CM | POA: Diagnosis not present

## 2017-02-07 DIAGNOSIS — Z85828 Personal history of other malignant neoplasm of skin: Secondary | ICD-10-CM | POA: Diagnosis not present

## 2017-07-17 DIAGNOSIS — Z1159 Encounter for screening for other viral diseases: Secondary | ICD-10-CM | POA: Diagnosis not present

## 2017-07-17 DIAGNOSIS — E782 Mixed hyperlipidemia: Secondary | ICD-10-CM | POA: Diagnosis not present

## 2017-07-17 DIAGNOSIS — I10 Essential (primary) hypertension: Secondary | ICD-10-CM | POA: Diagnosis not present

## 2017-07-17 DIAGNOSIS — I2510 Atherosclerotic heart disease of native coronary artery without angina pectoris: Secondary | ICD-10-CM | POA: Diagnosis not present

## 2017-07-17 DIAGNOSIS — F17201 Nicotine dependence, unspecified, in remission: Secondary | ICD-10-CM | POA: Diagnosis not present

## 2017-07-17 DIAGNOSIS — Z0000 Encounter for general adult medical examination without abnormal findings: Secondary | ICD-10-CM | POA: Diagnosis not present

## 2017-07-17 DIAGNOSIS — I714 Abdominal aortic aneurysm, without rupture: Secondary | ICD-10-CM | POA: Diagnosis not present

## 2017-08-27 DIAGNOSIS — H401212 Low-tension glaucoma, right eye, moderate stage: Secondary | ICD-10-CM | POA: Diagnosis not present

## 2017-08-27 DIAGNOSIS — H401221 Low-tension glaucoma, left eye, mild stage: Secondary | ICD-10-CM | POA: Diagnosis not present

## 2017-12-04 DIAGNOSIS — I2510 Atherosclerotic heart disease of native coronary artery without angina pectoris: Secondary | ICD-10-CM | POA: Diagnosis not present

## 2017-12-04 DIAGNOSIS — E7800 Pure hypercholesterolemia, unspecified: Secondary | ICD-10-CM | POA: Diagnosis not present

## 2017-12-04 DIAGNOSIS — I714 Abdominal aortic aneurysm, without rupture: Secondary | ICD-10-CM | POA: Diagnosis not present

## 2017-12-04 DIAGNOSIS — I10 Essential (primary) hypertension: Secondary | ICD-10-CM | POA: Diagnosis not present

## 2017-12-13 ENCOUNTER — Other Ambulatory Visit: Payer: Self-pay

## 2017-12-13 ENCOUNTER — Ambulatory Visit (HOSPITAL_COMMUNITY)
Admission: RE | Admit: 2017-12-13 | Discharge: 2017-12-13 | Disposition: A | Discharge status: Home / Self Care | Payer: Medicare Other | Source: Ambulatory Visit | Attending: Family | Admitting: Family

## 2017-12-13 ENCOUNTER — Encounter: Payer: Self-pay | Admitting: Family

## 2017-12-13 ENCOUNTER — Ambulatory Visit (INDEPENDENT_AMBULATORY_CARE_PROVIDER_SITE_OTHER): Payer: Medicare Other | Admitting: Family

## 2017-12-13 VITALS — BP 114/70 | HR 51 | Resp 18 | Ht 68.0 in | Wt 162.7 lb

## 2017-12-13 DIAGNOSIS — R0989 Other specified symptoms and signs involving the circulatory and respiratory systems: Secondary | ICD-10-CM | POA: Diagnosis not present

## 2017-12-13 DIAGNOSIS — I714 Abdominal aortic aneurysm, without rupture, unspecified: Secondary | ICD-10-CM

## 2017-12-13 DIAGNOSIS — F17200 Nicotine dependence, unspecified, uncomplicated: Secondary | ICD-10-CM

## 2017-12-13 DIAGNOSIS — Z95828 Presence of other vascular implants and grafts: Secondary | ICD-10-CM

## 2017-12-13 DIAGNOSIS — Z23 Encounter for immunization: Secondary | ICD-10-CM | POA: Diagnosis not present

## 2017-12-13 NOTE — Progress Notes (Signed)
[facility] [place]  CC: Follow up s/p Endovascular Repair of Abdominal Aortic Aneurysm    History of Present Illness  Riley Sharp is a 72 y.o. (07/18/1945) male who presents for routine follow up s/p EVAR (Date: 11/14/15)by Dr. Oneida Alar.  The right internal iliac artery was intentionally covered  to exclude an iliac aneurysm. He had some buttock claudication symptoms at his office visit in October 2017. Abdominal aortic aneurysm diameter was previously 5.2 cm.  He also has a known left common iliac artery aneurysm which was about 2 cm in diameter.   Dr. Oneida Alar last evaluated pt on 05-31-16. At that time CT angiogram dated 05/25/2016 was reviewed.. This showed occlusion of the proximal aspect of the right internal iliac artery with reconstitution distally but no flow around stent graft. The aneurysm was well excluded with no evidence of endoleak. There was a 2 cm left common iliac aneurysm. Well excluded abdominal aortic aneurysm status post stent graft repair. The patient will need repeat aortic ultrasound in 6 months time. He will try to continue a walking program to improve his buttock claudication. We will need to continue to watch the left common iliac aneurysm and if this reaches 3 cm in diameter he would need repair of this.  After walking about 100 yards his right buttock hurts, relieved by short rest, denies problems with his leg when he walks.     Diabetic: No Tobacco use: smoker  (3 cigars/day, starting smoking cigarettes at age 65 yrs)   Past Medical History:  Diagnosis Date  . AAA (abdominal aortic aneurysm) ([place])   . CAD (coronary artery disease)   . Cancer ([place])    basal cell carcimona right ear  . Glaucoma   . Hyperlipidemia   . Hypertension   . Myocardial infarction ([place]) 03/2003  . Wears glasses    Past Surgical History:  Procedure Laterality Date  . ABDOMINAL AORTIC ENDOVASCULAR STENT GRAFT N/A 11/14/2015   Procedure: ABDOMINAL AORTIC  ENDOVASCULAR STENT GRAFT;  Surgeon: Elam Dutch, MD;  Location: [place];  Service: Vascular;  Laterality: N/A;  . APPENDECTOMY    . CHOLECYSTECTOMY  2007   Gall Bladder  . COLONOSCOPY W/ BIOPSIES AND POLYPECTOMY    . EYE SURGERY     laser for glaucoma both eyesn x 2  . heart stent  Feb. 14, 2005   Social History Social History   Tobacco Use  . Smoking status: Current Some Day Smoker    Packs/day: 2.00    Years: 45.00    Pack years: 90.00    Types: Cigars  . Smokeless tobacco: Never Used  . Tobacco comment: 2-3 cigars per day  Substance Use Topics  . Alcohol use: Yes    Alcohol/week: 0.0 standard drinks    Comment: rare  . Drug use: No   Family History Family History  Problem Relation Age of Onset  . Cancer Father   . Heart disease Father        After age 34  . Hyperlipidemia Father   . Hypertension Father   . Heart attack Father   . Heart disease Sister        After age 3  . Hyperlipidemia Sister   . Heart attack Sister   . Hypertension Other   . Heart disease Other   . Cancer Other    Current Outpatient Medications on File Prior to Visit  Medication Sig Dispense Refill  . aspirin EC 81 MG tablet Take 81  mg by mouth daily.      . brimonidine (ALPHAGAN) 0.2 % ophthalmic solution Place 1 drop into both eyes 3 (three) times daily.     . dorzolamide (TRUSOPT) 2 % ophthalmic solution Place 1 drop into both eyes 3 (three) times daily.    Marland Kitchen latanoprost (XALATAN) 0.005 % ophthalmic solution Place 1 drop into both eyes daily.     . metoprolol succinate (TOPROL-XL) 25 MG 24 hr tablet Take 25 mg by mouth daily.      . ramipril (ALTACE) 5 MG tablet Take 5 mg by mouth daily.      . rosuvastatin (CRESTOR) 10 MG tablet Take 10 mg by mouth daily.    . timolol (BETIMOL) 0.5 % ophthalmic solution Place 1 drop into both eyes daily.       No current facility-administered medications on file prior to visit.    Allergies  Allergen Reactions  . No Known Allergies      ROS:  See HPI for pertinent positives and negatives.  Physical Examination  Vitals:   12/13/17 0902  BP: 114/70  Pulse: (!) 51  Resp: 18  SpO2: 99%  Weight: 162 lb 11.2 oz (73.8 kg)  Height: 5\' 8"  (1.727 m)   Body mass index is 24.74 kg/m.  General: A&O x 3, WD, male in NAD HEENT: No gross abnormalities except left eyelid moderate ptosis.  Pulmonary: Sym exp, respirations are non labored, fair air movement in all fields CTAB, no rales, rhonchi, or wheezes.  Cardiac: Regular rhythm, bradycardic (on a beta blocker), no murmur appreciated  Vascular: Vessel Right Left  Radial 2+Palpable 2+Palpable  Carotid  without bruit  without bruit  Aorta Not palpable N/A  Femoral 2+Palpable 2+Palpable  Popliteal Not palpable Not palpable  PT 2+Palpable 1+Palpable  DP Not Palpable Not Palpable   Gastrointestinal: soft, NTND, -G/R, - HSM, - palpable masses, - CVAT B. Musculoskeletal: M/S 5/5 throughout, extremities without ischemic changes. Skin: No rashes, no ulcers, no cellulitis.   Neurologic: Pain and light touch intact in extremities, Motor exam as listed above. CN 2-12 intact.  Psychiatric: Normal thought content, mood appropriate for clinical situation.    DATA  EVAR Duplex   Previous (Date: 12-06-16)  AAA sac size: 5.5 cm; Right CIA: 3.2 cm, Left CIA: 1.9 cm  no endoleak detected   Current (Date: 12-13-17)  AAA sac size: (distal) 5.7 cm; Right CIA: 1.64 cm; Left CIA: 1.98 cm  no endoleak detected  CTA Abd/Pelvis Duplex (Date: 05-25-16) Inflow: The right iliac stent graft excludes a common iliac artery aneurysm and extends to the right external iliac artery. Maximal diameter of the excluded common iliac artery aneurysm is 2.6 cm. This is stable. There is mild narrowing just beyond the landing zone in the proximal external iliac artery. There is a subtle dissection flap just beyond the landing zone but this does not appear flow Limiting.  Left iliac stent graft landing zone  occurs in the distal common iliac artery. Native common iliac artery remains aneurysmal with some circumferential chronic mural thrombus. Maximal diameter is 2.1 cm. This is increased compared with 1.8 cm on the prior study. The internal iliac artery remains ectatic. The left external iliac artery is diminutive and patent. Stable aorto bi-iliac stent graft excluding an aortic and right common iliac artery aneurysms. Aneurysm sac diameters are stable. Maximal aortic sac diameter is 5.2 cm. There is no evidence of endoleak. Left common iliac artery aneurysm has increased from 1.8 cm to 2.1 Cm.  Medical Decision Making  Riley Sharp is a 72 y.o. male who presents s/p EVAR (Date: 11/14/15).  Pt is asymptomatic with increase in sac size.  I discussed with Dr. Donzetta Matters 5.7 cm sac size today with no endoleak seen, compared to 5.5 cm by duplex on 12-06-16, and 5.2 cm by CTA on 05-25-16. Pt remains asymptomatic, no abdominal pain, no back pain.  Will obtain CTA abd/pelvis in about 2 months, pt will see Dr. Oneida Alar afterward.   At pt visit on 12-06-16, I discussed with Dr. Oneida Alar the 3.2 cm right CIA as measured that day; we reviewed CTA abd/pelvis results from April 2018, maximal diameter of the excluded common iliac artery aneurysm was 2.6 cm. Pt was to follow up in 12 months with endograft duplex, will also check ABI's (decreased pedal pulses, claudication right buttock).   Thank you for allowing [place] to participate in this patient's care.  Clemon Chambers, RN, MSN, FNP-C [facility] [place] Office: [phone]  Clinic Physician: Donzetta Matters  12/13/2017, 9:04 AM

## 2017-12-13 NOTE — Patient Instructions (Signed)
Before your next abdominal ultrasound:  Avoid gas forming foods and beverages the day before the test.   Take two Extra-Strength Gas-X capsules at bedtime the night before the test. Take another two Extra-Strength Gas-X capsules in the middle of the night if you get up to the restroom, if not, first thing in the morning with water.  Do not chew gum.     Steps to Quit Smoking Smoking tobacco can be bad for your health. It can also affect almost every organ in your body. Smoking puts you and people around you at risk for many serious long-lasting (chronic) diseases. Quitting smoking is hard, but it is one of the best things that you can do for your health. It is never too late to quit. What are the benefits of quitting smoking? When you quit smoking, you lower your risk for getting serious diseases and conditions. They can include:  Lung cancer or lung disease.  Heart disease.  Stroke.  Heart attack.  Not being able to have children (infertility).  Weak bones (osteoporosis) and broken bones (fractures).  If you have coughing, wheezing, and shortness of breath, those symptoms may get better when you quit. You may also get sick less often. If you are pregnant, quitting smoking can help to lower your chances of having a baby of low birth weight. What can I do to help me quit smoking? Talk with your doctor about what can help you quit smoking. Some things you can do (strategies) include:  Quitting smoking totally, instead of slowly cutting back how much you smoke over a period of time.  Going to in-person counseling. You are more likely to quit if you go to many counseling sessions.  Using resources and support systems, such as: ? Database administrator with a Social worker. ? Phone quitlines. ? Careers information officer. ? Support groups or group counseling. ? Text messaging programs. ? Mobile phone apps or applications.  Taking medicines. Some of these medicines may have nicotine in them.  If you are pregnant or breastfeeding, do not take any medicines to quit smoking unless your doctor says it is okay. Talk with your doctor about counseling or other things that can help you.  Talk with your doctor about using more than one strategy at the same time, such as taking medicines while you are also going to in-person counseling. This can help make quitting easier. What things can I do to make it easier to quit? Quitting smoking might feel very hard at first, but there is a lot that you can do to make it easier. Take these steps:  Talk to your family and friends. Ask them to support and encourage you.  Call phone quitlines, reach out to support groups, or work with a Social worker.  Ask people who smoke to not smoke around you.  Avoid places that make you want (trigger) to smoke, such as: ? Bars. ? Parties. ? Smoke-break areas at work.  Spend time with people who do not smoke.  Lower the stress in your life. Stress can make you want to smoke. Try these things to help your stress: ? Getting regular exercise. ? Deep-breathing exercises. ? Yoga. ? Meditating. ? Doing a body scan. To do this, close your eyes, focus on one area of your body at a time from head to toe, and notice which parts of your body are tense. Try to relax the muscles in those areas.  Download or buy apps on your mobile phone or tablet that can  help you stick to your quit plan. There are many free apps, such as QuitGuide from the [employer] Office manager for Disease Control and Prevention). You can find more support from smokefree.gov and other websites.  This information is not intended to replace advice given to you by your health care provider. Make sure you discuss any questions you have with your health care provider. Document Released: 11/11/2008 Document Revised: 09/13/2015 Document Reviewed: 06/01/2014 Elsevier Interactive Patient Education  2018 [employer].

## 2018-01-06 ENCOUNTER — Other Ambulatory Visit: Payer: Medicare Other

## 2018-01-27 DIAGNOSIS — J22 Unspecified acute lower respiratory infection: Secondary | ICD-10-CM | POA: Diagnosis not present

## 2018-01-27 DIAGNOSIS — J069 Acute upper respiratory infection, unspecified: Secondary | ICD-10-CM | POA: Diagnosis not present

## 2018-02-05 ENCOUNTER — Encounter: Payer: Self-pay | Admitting: Radiology

## 2018-02-06 ENCOUNTER — Ambulatory Visit (INDEPENDENT_AMBULATORY_CARE_PROVIDER_SITE_OTHER): Payer: Medicare Other | Admitting: Vascular Surgery

## 2018-02-06 ENCOUNTER — Other Ambulatory Visit: Payer: Self-pay

## 2018-02-06 ENCOUNTER — Encounter: Payer: Self-pay | Admitting: Vascular Surgery

## 2018-02-06 ENCOUNTER — Ambulatory Visit
Admission: RE | Admit: 2018-02-06 | Discharge: 2018-02-06 | Disposition: A | Discharge status: Home / Self Care | Payer: Medicare Other | Source: Ambulatory Visit | Attending: Vascular Surgery | Admitting: Vascular Surgery

## 2018-02-06 VITALS — BP 106/66 | HR 56 | Temp 97.1°F | Resp 20 | Ht 68.0 in | Wt 162.0 lb

## 2018-02-06 DIAGNOSIS — I714 Abdominal aortic aneurysm, without rupture, unspecified: Secondary | ICD-10-CM

## 2018-02-06 DIAGNOSIS — I712 Thoracic aortic aneurysm, without rupture: Secondary | ICD-10-CM | POA: Diagnosis not present

## 2018-02-06 MED ORDER — IOPAMIDOL (ISOVUE-370) INJECTION 76%
75.0000 mL | Freq: Once | INTRAVENOUS | Status: AC | PRN
  Administered 2018-02-06: 75 mL via INTRAVENOUS

## 2018-02-06 NOTE — Progress Notes (Signed)
Patient is a 73 year old male who returns for follow-up today.  He had a Gore Excluder stent graft repair of an infrarenal abdominal aortic aneurysm and common iliac aneurysms in 2017.  Preoperative aortic diameter was 5.3 cm.  On recent screening ultrasound he was noted to have increased size to 5.7 cm.  He denies any abdominal or back pain.  He has occasional groin pain from his stick sites that has been intermittent since his repair.  He also occasionally has some right buttock claudication.  Review of systems: He denies shortness of breath.  He denies chest pain.  Past Medical History:  Diagnosis Date  . AAA (abdominal aortic aneurysm) ([place])   . CAD (coronary artery disease)   . Cancer ([place])    basal cell carcimona right ear  . Glaucoma   . Hyperlipidemia   . Hypertension   . Myocardial infarction ([place]) 03/2003  . Wears glasses     Social History   Socioeconomic History  . Marital status: Single    Spouse name: Not on file  . Number of children: Not on file  . Years of education: Not on file  . Highest education level: Not on file  Occupational History  . Not on file  Social Needs  . Financial resource strain: Not on file  . Food insecurity:    Worry: Not on file    Inability: Not on file  . Transportation needs:    Medical: Not on file    Non-medical: Not on file  Tobacco Use  . Smoking status: Current Some Day Smoker    Packs/day: 2.00    Years: 45.00    Pack years: 90.00    Types: Cigars  . Smokeless tobacco: Never Used  . Tobacco comment: 2-3 cigars per day  Substance and Sexual Activity  . Alcohol use: Yes    Alcohol/week: 0.0 standard drinks    Comment: rare  . Drug use: No  . Sexual activity: Not on file  Lifestyle  . Physical activity:    Days per week: Not on file    Minutes per session: Not on file  . Stress: Not on file  Relationships  . Social connections:    Talks on phone: Not on file    Gets together: Not on file    Attends religious  service: Not on file    Active member of club or organization: Not on file    Attends meetings of clubs or organizations: Not on file    Relationship status: Not on file  . Intimate partner violence:    Fear of current or ex partner: Not on file    Emotionally abused: Not on file    Physically abused: Not on file    Forced sexual activity: Not on file  Other Topics Concern  . Not on file  Social History Narrative  . Not on file    Physical exam:  Vitals:   02/06/18 1116  BP: 106/66  Pulse: (!) 56  Resp: 20  Temp: (!) 97.1 F (36.2 C)  SpO2: 96%  Weight: 162 lb (73.5 kg)  Height: 5\' 8"  (1.727 m)    Abdomen: Soft nontender nondistended no pulsatile epigastric mass 2+ femoral pulses bilaterally  Data: Patient had a CT angiogram of the abdomen and pelvis today.  Abdominal aortic aneurysm diameter was 5.2 cm which is unchanged from 2017.  Most likely the ultrasound was an oblique view as there is some tortuosity to the aneurysm.  Right common iliac was  2.6 cm left 2.0 cm.  Iliac artery stenosis was noted as well.  SMA and renal arteries are all widely patent.  There is some retrograde filling of the inferior mesenteric artery but no evidence of endoleak.  Assessment: Stable abdominal aortic aneurysm status post Gore Excluder stent graft repair 5.2 cm diameter unchanged from preop  Plan: Pt will follow-up with [place] in 1 year with a aortic duplex at that time.  Ruta Hinds, MD [facility] [place] Office: [phone] Pager: [phone]

## 2018-02-12 DIAGNOSIS — Z08 Encounter for follow-up examination after completed treatment for malignant neoplasm: Secondary | ICD-10-CM | POA: Diagnosis not present

## 2018-02-12 DIAGNOSIS — Z85828 Personal history of other malignant neoplasm of skin: Secondary | ICD-10-CM | POA: Diagnosis not present

## 2018-02-12 DIAGNOSIS — L280 Lichen simplex chronicus: Secondary | ICD-10-CM | POA: Diagnosis not present

## 2018-02-12 DIAGNOSIS — L281 Prurigo nodularis: Secondary | ICD-10-CM | POA: Diagnosis not present

## 2018-02-12 DIAGNOSIS — L570 Actinic keratosis: Secondary | ICD-10-CM | POA: Diagnosis not present

## 2018-03-18 DIAGNOSIS — H401221 Low-tension glaucoma, left eye, mild stage: Secondary | ICD-10-CM | POA: Diagnosis not present

## 2018-03-18 DIAGNOSIS — H2513 Age-related nuclear cataract, bilateral: Secondary | ICD-10-CM | POA: Diagnosis not present

## 2018-03-18 DIAGNOSIS — H401212 Low-tension glaucoma, right eye, moderate stage: Secondary | ICD-10-CM | POA: Diagnosis not present

## 2018-06-05 DIAGNOSIS — R002 Palpitations: Secondary | ICD-10-CM | POA: Diagnosis not present

## 2018-06-06 DIAGNOSIS — R002 Palpitations: Secondary | ICD-10-CM | POA: Diagnosis not present

## 2018-08-15 DIAGNOSIS — N400 Enlarged prostate without lower urinary tract symptoms: Secondary | ICD-10-CM | POA: Diagnosis not present

## 2018-08-15 DIAGNOSIS — I10 Essential (primary) hypertension: Secondary | ICD-10-CM | POA: Diagnosis not present

## 2018-08-15 DIAGNOSIS — F17201 Nicotine dependence, unspecified, in remission: Secondary | ICD-10-CM | POA: Diagnosis not present

## 2018-08-15 DIAGNOSIS — I2510 Atherosclerotic heart disease of native coronary artery without angina pectoris: Secondary | ICD-10-CM | POA: Diagnosis not present

## 2018-08-15 DIAGNOSIS — I714 Abdominal aortic aneurysm, without rupture: Secondary | ICD-10-CM | POA: Diagnosis not present

## 2018-08-15 DIAGNOSIS — E782 Mixed hyperlipidemia: Secondary | ICD-10-CM | POA: Diagnosis not present

## 2018-08-15 DIAGNOSIS — Z0000 Encounter for general adult medical examination without abnormal findings: Secondary | ICD-10-CM | POA: Diagnosis not present

## 2018-10-02 DIAGNOSIS — H401212 Low-tension glaucoma, right eye, moderate stage: Secondary | ICD-10-CM | POA: Diagnosis not present

## 2018-10-02 DIAGNOSIS — H2513 Age-related nuclear cataract, bilateral: Secondary | ICD-10-CM | POA: Diagnosis not present

## 2018-10-02 DIAGNOSIS — H401221 Low-tension glaucoma, left eye, mild stage: Secondary | ICD-10-CM | POA: Diagnosis not present

## 2018-10-20 DIAGNOSIS — I714 Abdominal aortic aneurysm, without rupture: Secondary | ICD-10-CM | POA: Diagnosis not present

## 2018-10-20 DIAGNOSIS — E7800 Pure hypercholesterolemia, unspecified: Secondary | ICD-10-CM | POA: Diagnosis not present

## 2018-10-20 DIAGNOSIS — I10 Essential (primary) hypertension: Secondary | ICD-10-CM | POA: Diagnosis not present

## 2018-10-20 DIAGNOSIS — I2510 Atherosclerotic heart disease of native coronary artery without angina pectoris: Secondary | ICD-10-CM | POA: Diagnosis not present

## 2018-10-21 DIAGNOSIS — Z7189 Other specified counseling: Secondary | ICD-10-CM | POA: Diagnosis not present

## 2018-10-21 DIAGNOSIS — Z20828 Contact with and (suspected) exposure to other viral communicable diseases: Secondary | ICD-10-CM | POA: Diagnosis not present

## 2018-11-24 DIAGNOSIS — Z23 Encounter for immunization: Secondary | ICD-10-CM | POA: Diagnosis not present

## 2019-01-19 DIAGNOSIS — Z03818 Encounter for observation for suspected exposure to other biological agents ruled out: Secondary | ICD-10-CM | POA: Diagnosis not present

## 2019-02-12 DIAGNOSIS — M5431 Sciatica, right side: Secondary | ICD-10-CM | POA: Diagnosis not present

## 2019-02-16 DIAGNOSIS — D485 Neoplasm of uncertain behavior of skin: Secondary | ICD-10-CM | POA: Diagnosis not present

## 2019-02-16 DIAGNOSIS — L820 Inflamed seborrheic keratosis: Secondary | ICD-10-CM | POA: Diagnosis not present

## 2019-02-16 DIAGNOSIS — C44519 Basal cell carcinoma of skin of other part of trunk: Secondary | ICD-10-CM | POA: Diagnosis not present

## 2019-02-16 DIAGNOSIS — L281 Prurigo nodularis: Secondary | ICD-10-CM | POA: Diagnosis not present

## 2019-02-24 DIAGNOSIS — M5416 Radiculopathy, lumbar region: Secondary | ICD-10-CM | POA: Diagnosis not present

## 2019-02-24 DIAGNOSIS — M545 Low back pain: Secondary | ICD-10-CM | POA: Diagnosis not present

## 2019-03-06 DIAGNOSIS — H401221 Low-tension glaucoma, left eye, mild stage: Secondary | ICD-10-CM | POA: Diagnosis not present

## 2019-03-06 DIAGNOSIS — H25812 Combined forms of age-related cataract, left eye: Secondary | ICD-10-CM | POA: Diagnosis not present

## 2019-03-06 DIAGNOSIS — H02423 Myogenic ptosis of bilateral eyelids: Secondary | ICD-10-CM | POA: Diagnosis not present

## 2019-03-06 DIAGNOSIS — H401212 Low-tension glaucoma, right eye, moderate stage: Secondary | ICD-10-CM | POA: Diagnosis not present

## 2019-03-06 DIAGNOSIS — H25811 Combined forms of age-related cataract, right eye: Secondary | ICD-10-CM | POA: Diagnosis not present

## 2019-03-19 ENCOUNTER — Other Ambulatory Visit (HOSPITAL_COMMUNITY): Payer: Medicare Other

## 2019-03-19 ENCOUNTER — Ambulatory Visit: Payer: Medicare Other | Admitting: Vascular Surgery

## 2019-03-24 DIAGNOSIS — M545 Low back pain: Secondary | ICD-10-CM | POA: Diagnosis not present

## 2019-03-27 DIAGNOSIS — M545 Low back pain: Secondary | ICD-10-CM | POA: Diagnosis not present

## 2019-03-30 DIAGNOSIS — M5116 Intervertebral disc disorders with radiculopathy, lumbar region: Secondary | ICD-10-CM | POA: Diagnosis not present

## 2019-04-02 DIAGNOSIS — M5116 Intervertebral disc disorders with radiculopathy, lumbar region: Secondary | ICD-10-CM | POA: Diagnosis not present

## 2019-04-06 DIAGNOSIS — M5416 Radiculopathy, lumbar region: Secondary | ICD-10-CM | POA: Diagnosis not present

## 2019-04-08 ENCOUNTER — Telehealth (HOSPITAL_COMMUNITY): Payer: Self-pay

## 2019-04-08 ENCOUNTER — Other Ambulatory Visit: Payer: Self-pay

## 2019-04-08 DIAGNOSIS — I714 Abdominal aortic aneurysm, without rupture, unspecified: Secondary | ICD-10-CM

## 2019-04-08 DIAGNOSIS — M5116 Intervertebral disc disorders with radiculopathy, lumbar region: Secondary | ICD-10-CM | POA: Diagnosis not present

## 2019-04-08 NOTE — Telephone Encounter (Signed)
The above patient or their representative was contacted and gave the following answers to these questions:         Do you have any of the following symptoms?    NO  Fever                    Cough                   Shortness of breath  Do  you have any of the following other symptoms?    muscle pain         vomiting,        diarrhea        rash         weakness        red eye        abdominal pain         bruising          bruising or bleeding              joint pain           severe headache    Have you been in contact with someone who was or has been sick in the past 2 weeks?  NO  Yes                 Unsure                         Unable to assess   Does the person that you were in contact with have any of the following symptoms?   Cough         shortness of breath           muscle pain         vomiting,            diarrhea            rash            weakness           fever            red eye           abdominal pain           bruising  or  bleeding                joint pain                severe headache                 COMMENTS OR ACTION PLAN FOR THIS PATIENT:        ALL QUESTIONS WERE ANSWERED/[facility] PATIENT HAS BEEN GIVEN BOTH COVID VACCINES

## 2019-04-09 ENCOUNTER — Ambulatory Visit (HOSPITAL_COMMUNITY)
Admission: RE | Admit: 2019-04-09 | Discharge: 2019-04-09 | Disposition: A | Discharge status: Home / Self Care | Payer: Medicare Other | Source: Ambulatory Visit | Attending: Vascular Surgery | Admitting: Vascular Surgery

## 2019-04-09 ENCOUNTER — Encounter: Payer: Self-pay | Admitting: Vascular Surgery

## 2019-04-09 ENCOUNTER — Other Ambulatory Visit: Payer: Self-pay

## 2019-04-09 ENCOUNTER — Ambulatory Visit (INDEPENDENT_AMBULATORY_CARE_PROVIDER_SITE_OTHER): Payer: Medicare Other | Admitting: Vascular Surgery

## 2019-04-09 VITALS — BP 111/65 | HR 56 | Temp 97.7°F | Resp 20 | Ht 68.0 in | Wt 168.0 lb

## 2019-04-09 DIAGNOSIS — I714 Abdominal aortic aneurysm, without rupture, unspecified: Secondary | ICD-10-CM

## 2019-04-09 NOTE — Progress Notes (Signed)
Patient is a 74 year old male who returns for follow-up today.  He underwent placement of a Gore excluder aneurysm stent graft in 2017 for a 5.3 cm abdominal aortic aneurysm and bilateral common iliac aneurysms.  His aneurysm diameter was 5.2 cm in 2020.  He occasionally still has some pain in his right hip and buttocks with walking.  He has no symptoms in his left leg.  He does not really describe calf claudication.  He states he has been having some back pain recently and is currently in physical therapy and recently had a MRI scan to evaluate that.  Otherwise she is doing well.  He is on a statin and aspirin.  Past Medical History:  Diagnosis Date  . AAA (abdominal aortic aneurysm) ([place])   . CAD (coronary artery disease)   . Cancer ([place])    basal cell carcimona right ear  . Glaucoma   . Hyperlipidemia   . Hypertension   . Myocardial infarction ([place]) 03/2003  . Wears glasses    Past Surgical History:  Procedure Laterality Date  . ABDOMINAL AORTIC ENDOVASCULAR STENT GRAFT N/A 11/14/2015   Procedure: ABDOMINAL AORTIC ENDOVASCULAR STENT GRAFT;  Surgeon: Elam Dutch, MD;  Location: [place] [facility];  Service: Vascular;  Laterality: N/A;  . APPENDECTOMY    . CHOLECYSTECTOMY  2007   Gall Bladder  . COLONOSCOPY W/ BIOPSIES AND POLYPECTOMY    . EYE SURGERY     laser for glaucoma both eyesn x 2  . heart stent  Feb. 14, 2005    Current Outpatient Medications on File Prior to Visit  Medication Sig Dispense Refill  . aspirin EC 81 MG tablet Take 81 mg by mouth daily.      . brimonidine (ALPHAGAN) 0.2 % ophthalmic solution Place 1 drop into both eyes 3 (three) times daily.     . dorzolamide (TRUSOPT) 2 % ophthalmic solution Place 1 drop into both eyes 3 (three) times daily.    Marland Kitchen latanoprost (XALATAN) 0.005 % ophthalmic solution Place 1 drop into both eyes daily.     . metoprolol succinate (TOPROL-XL) 25 MG 24 hr tablet Take 25 mg by mouth daily.      . ramipril (ALTACE) 5 MG tablet Take 5 mg by mouth  daily.      . rosuvastatin (CRESTOR) 10 MG tablet Take 10 mg by mouth daily.    . timolol (BETIMOL) 0.5 % ophthalmic solution Place 1 drop into both eyes daily.       No current facility-administered medications on file prior to visit.   Review of systems: He has no chest pain.  He has no shortness of breath.  Physical exam:  Vitals:   04/09/19 0916  BP: 111/65  Pulse: (!) 56  Resp: 20  Temp: 97.7 F (36.5 C)  SpO2: 100%  Weight: 168 lb (76.2 kg)  Height: 5\' 8"  (1.727 m)    Abdomen: Soft nontender nondistended no pulsatile mass 2+ symmetric femoral pulses  Extremities: 2+ posterior tibial pulses absent dorsalis pedis pulses  Data: Patient had a duplex ultrasound of his abdominal aorta today.  This showed an aortic diameter of 4.8 x 5.3 cm.  This is slightly smaller or unchanged from preop.  Iliac diameters were less than 2 bilaterally.  There was also some narrowing of the distal left iliac below the stent velocities in the external iliac artery were about 120 cm/s   Assessment: Doing well status post Gore Excluder stent graft repair of abdominal aortic and common iliac aneurysms.  Patient may have some narrowing of his last external iliac artery but currently this is asymptomatic.  He has symmetric femoral pulses and palpable posterior tibial pulses bilaterally.  Plan: Patient will continue to monitor for symptoms in his left leg.  If he develops claudication we would further evaluate the left external iliac artery narrowing.  Otherwise he will have a repeat ultrasound of his abdominal aorta and bilateral ABIs in 1 year.  He will be seen in our [facility] clinic.  Ruta Hinds, MD [facility] [place] Office: [phone]

## 2019-04-10 ENCOUNTER — Other Ambulatory Visit: Payer: Self-pay | Admitting: *Deleted

## 2019-04-10 DIAGNOSIS — R0989 Other specified symptoms and signs involving the circulatory and respiratory systems: Secondary | ICD-10-CM

## 2019-04-10 DIAGNOSIS — Z95828 Presence of other vascular implants and grafts: Secondary | ICD-10-CM

## 2019-04-13 DIAGNOSIS — M5116 Intervertebral disc disorders with radiculopathy, lumbar region: Secondary | ICD-10-CM | POA: Diagnosis not present

## 2019-04-15 DIAGNOSIS — M5116 Intervertebral disc disorders with radiculopathy, lumbar region: Secondary | ICD-10-CM | POA: Diagnosis not present

## 2019-04-20 DIAGNOSIS — M5116 Intervertebral disc disorders with radiculopathy, lumbar region: Secondary | ICD-10-CM | POA: Diagnosis not present

## 2019-04-23 DIAGNOSIS — M5116 Intervertebral disc disorders with radiculopathy, lumbar region: Secondary | ICD-10-CM | POA: Diagnosis not present

## 2019-04-30 DIAGNOSIS — M5116 Intervertebral disc disorders with radiculopathy, lumbar region: Secondary | ICD-10-CM | POA: Diagnosis not present

## 2019-05-05 DIAGNOSIS — M5116 Intervertebral disc disorders with radiculopathy, lumbar region: Secondary | ICD-10-CM | POA: Diagnosis not present

## 2019-05-06 DIAGNOSIS — C44519 Basal cell carcinoma of skin of other part of trunk: Secondary | ICD-10-CM | POA: Diagnosis not present

## 2019-05-29 DIAGNOSIS — M545 Low back pain: Secondary | ICD-10-CM | POA: Diagnosis not present

## 2019-05-29 DIAGNOSIS — M4696 Unspecified inflammatory spondylopathy, lumbar region: Secondary | ICD-10-CM | POA: Diagnosis not present

## 2019-06-05 DIAGNOSIS — H25812 Combined forms of age-related cataract, left eye: Secondary | ICD-10-CM | POA: Diagnosis not present

## 2019-06-05 DIAGNOSIS — H401212 Low-tension glaucoma, right eye, moderate stage: Secondary | ICD-10-CM | POA: Diagnosis not present

## 2019-06-05 DIAGNOSIS — H25811 Combined forms of age-related cataract, right eye: Secondary | ICD-10-CM | POA: Diagnosis not present

## 2019-06-05 DIAGNOSIS — H02423 Myogenic ptosis of bilateral eyelids: Secondary | ICD-10-CM | POA: Diagnosis not present

## 2019-06-05 DIAGNOSIS — H401221 Low-tension glaucoma, left eye, mild stage: Secondary | ICD-10-CM | POA: Diagnosis not present

## 2019-07-21 DIAGNOSIS — Z03818 Encounter for observation for suspected exposure to other biological agents ruled out: Secondary | ICD-10-CM | POA: Diagnosis not present

## 2019-07-21 DIAGNOSIS — Z20822 Contact with and (suspected) exposure to covid-19: Secondary | ICD-10-CM | POA: Diagnosis not present

## 2019-08-31 DIAGNOSIS — I2510 Atherosclerotic heart disease of native coronary artery without angina pectoris: Secondary | ICD-10-CM | POA: Diagnosis not present

## 2019-08-31 DIAGNOSIS — Z0000 Encounter for general adult medical examination without abnormal findings: Secondary | ICD-10-CM | POA: Diagnosis not present

## 2019-08-31 DIAGNOSIS — E782 Mixed hyperlipidemia: Secondary | ICD-10-CM | POA: Diagnosis not present

## 2019-08-31 DIAGNOSIS — I714 Abdominal aortic aneurysm, without rupture: Secondary | ICD-10-CM | POA: Diagnosis not present

## 2019-08-31 DIAGNOSIS — F17201 Nicotine dependence, unspecified, in remission: Secondary | ICD-10-CM | POA: Diagnosis not present

## 2019-08-31 DIAGNOSIS — Z125 Encounter for screening for malignant neoplasm of prostate: Secondary | ICD-10-CM | POA: Diagnosis not present

## 2019-08-31 DIAGNOSIS — I10 Essential (primary) hypertension: Secondary | ICD-10-CM | POA: Diagnosis not present

## 2019-10-09 DIAGNOSIS — H25811 Combined forms of age-related cataract, right eye: Secondary | ICD-10-CM | POA: Diagnosis not present

## 2019-10-09 DIAGNOSIS — H02423 Myogenic ptosis of bilateral eyelids: Secondary | ICD-10-CM | POA: Diagnosis not present

## 2019-10-09 DIAGNOSIS — H25812 Combined forms of age-related cataract, left eye: Secondary | ICD-10-CM | POA: Diagnosis not present

## 2019-10-09 DIAGNOSIS — H401221 Low-tension glaucoma, left eye, mild stage: Secondary | ICD-10-CM | POA: Diagnosis not present

## 2019-10-09 DIAGNOSIS — H401212 Low-tension glaucoma, right eye, moderate stage: Secondary | ICD-10-CM | POA: Diagnosis not present

## 2019-11-06 DIAGNOSIS — Z23 Encounter for immunization: Secondary | ICD-10-CM | POA: Diagnosis not present

## 2019-11-24 DIAGNOSIS — I714 Abdominal aortic aneurysm, without rupture: Secondary | ICD-10-CM | POA: Diagnosis not present

## 2019-11-24 DIAGNOSIS — I10 Essential (primary) hypertension: Secondary | ICD-10-CM | POA: Diagnosis not present

## 2019-11-24 DIAGNOSIS — E7800 Pure hypercholesterolemia, unspecified: Secondary | ICD-10-CM | POA: Diagnosis not present

## 2019-11-24 DIAGNOSIS — I2510 Atherosclerotic heart disease of native coronary artery without angina pectoris: Secondary | ICD-10-CM | POA: Diagnosis not present

## 2020-01-26 DIAGNOSIS — M5416 Radiculopathy, lumbar region: Secondary | ICD-10-CM | POA: Diagnosis not present

## 2020-02-16 DIAGNOSIS — M545 Low back pain, unspecified: Secondary | ICD-10-CM | POA: Diagnosis not present

## 2020-02-16 DIAGNOSIS — M48061 Spinal stenosis, lumbar region without neurogenic claudication: Secondary | ICD-10-CM | POA: Diagnosis not present

## 2020-02-16 DIAGNOSIS — M5416 Radiculopathy, lumbar region: Secondary | ICD-10-CM | POA: Diagnosis not present

## 2020-02-22 DIAGNOSIS — M48062 Spinal stenosis, lumbar region with neurogenic claudication: Secondary | ICD-10-CM | POA: Diagnosis not present

## 2020-02-22 DIAGNOSIS — L570 Actinic keratosis: Secondary | ICD-10-CM | POA: Diagnosis not present

## 2020-02-22 DIAGNOSIS — Z85828 Personal history of other malignant neoplasm of skin: Secondary | ICD-10-CM | POA: Diagnosis not present

## 2020-02-22 DIAGNOSIS — M9953 Intervertebral disc stenosis of neural canal of lumbar region: Secondary | ICD-10-CM | POA: Diagnosis not present

## 2020-02-22 DIAGNOSIS — L981 Factitial dermatitis: Secondary | ICD-10-CM | POA: Diagnosis not present

## 2020-02-22 DIAGNOSIS — M5116 Intervertebral disc disorders with radiculopathy, lumbar region: Secondary | ICD-10-CM | POA: Diagnosis not present

## 2020-02-24 DIAGNOSIS — M48062 Spinal stenosis, lumbar region with neurogenic claudication: Secondary | ICD-10-CM | POA: Diagnosis not present

## 2020-02-24 DIAGNOSIS — M9953 Intervertebral disc stenosis of neural canal of lumbar region: Secondary | ICD-10-CM | POA: Diagnosis not present

## 2020-02-24 DIAGNOSIS — M5116 Intervertebral disc disorders with radiculopathy, lumbar region: Secondary | ICD-10-CM | POA: Diagnosis not present

## 2020-02-29 DIAGNOSIS — M5116 Intervertebral disc disorders with radiculopathy, lumbar region: Secondary | ICD-10-CM | POA: Diagnosis not present

## 2020-02-29 DIAGNOSIS — M9953 Intervertebral disc stenosis of neural canal of lumbar region: Secondary | ICD-10-CM | POA: Diagnosis not present

## 2020-02-29 DIAGNOSIS — M48062 Spinal stenosis, lumbar region with neurogenic claudication: Secondary | ICD-10-CM | POA: Diagnosis not present

## 2020-03-02 DIAGNOSIS — M48062 Spinal stenosis, lumbar region with neurogenic claudication: Secondary | ICD-10-CM | POA: Diagnosis not present

## 2020-03-02 DIAGNOSIS — M9953 Intervertebral disc stenosis of neural canal of lumbar region: Secondary | ICD-10-CM | POA: Diagnosis not present

## 2020-03-02 DIAGNOSIS — M5116 Intervertebral disc disorders with radiculopathy, lumbar region: Secondary | ICD-10-CM | POA: Diagnosis not present

## 2020-03-07 DIAGNOSIS — M48062 Spinal stenosis, lumbar region with neurogenic claudication: Secondary | ICD-10-CM | POA: Diagnosis not present

## 2020-03-07 DIAGNOSIS — M9953 Intervertebral disc stenosis of neural canal of lumbar region: Secondary | ICD-10-CM | POA: Diagnosis not present

## 2020-03-07 DIAGNOSIS — M5116 Intervertebral disc disorders with radiculopathy, lumbar region: Secondary | ICD-10-CM | POA: Diagnosis not present

## 2020-03-09 DIAGNOSIS — M9953 Intervertebral disc stenosis of neural canal of lumbar region: Secondary | ICD-10-CM | POA: Diagnosis not present

## 2020-03-09 DIAGNOSIS — M48062 Spinal stenosis, lumbar region with neurogenic claudication: Secondary | ICD-10-CM | POA: Diagnosis not present

## 2020-03-09 DIAGNOSIS — M5116 Intervertebral disc disorders with radiculopathy, lumbar region: Secondary | ICD-10-CM | POA: Diagnosis not present

## 2020-03-14 DIAGNOSIS — M5116 Intervertebral disc disorders with radiculopathy, lumbar region: Secondary | ICD-10-CM | POA: Diagnosis not present

## 2020-03-14 DIAGNOSIS — M9953 Intervertebral disc stenosis of neural canal of lumbar region: Secondary | ICD-10-CM | POA: Diagnosis not present

## 2020-03-14 DIAGNOSIS — M48062 Spinal stenosis, lumbar region with neurogenic claudication: Secondary | ICD-10-CM | POA: Diagnosis not present

## 2020-03-17 DIAGNOSIS — M5116 Intervertebral disc disorders with radiculopathy, lumbar region: Secondary | ICD-10-CM | POA: Diagnosis not present

## 2020-03-17 DIAGNOSIS — M48062 Spinal stenosis, lumbar region with neurogenic claudication: Secondary | ICD-10-CM | POA: Diagnosis not present

## 2020-03-17 DIAGNOSIS — M9953 Intervertebral disc stenosis of neural canal of lumbar region: Secondary | ICD-10-CM | POA: Diagnosis not present

## 2020-03-30 ENCOUNTER — Other Ambulatory Visit: Payer: Self-pay

## 2020-03-30 ENCOUNTER — Other Ambulatory Visit: Payer: Self-pay | Admitting: Family Medicine

## 2020-03-30 ENCOUNTER — Ambulatory Visit
Admission: RE | Admit: 2020-03-30 | Discharge: 2020-03-30 | Disposition: A | Discharge status: Home / Self Care | Payer: Medicare Other | Source: Ambulatory Visit | Attending: Family Medicine | Admitting: Family Medicine

## 2020-03-30 DIAGNOSIS — R042 Hemoptysis: Secondary | ICD-10-CM

## 2020-03-30 DIAGNOSIS — R059 Cough, unspecified: Secondary | ICD-10-CM | POA: Diagnosis not present

## 2020-03-31 ENCOUNTER — Other Ambulatory Visit: Payer: Self-pay | Admitting: Family Medicine

## 2020-03-31 ENCOUNTER — Ambulatory Visit
Admission: RE | Admit: 2020-03-31 | Discharge: 2020-03-31 | Disposition: A | Discharge status: Home / Self Care | Payer: Medicare Other | Source: Ambulatory Visit | Attending: Family Medicine | Admitting: Family Medicine

## 2020-03-31 DIAGNOSIS — I700 Atherosclerosis of aorta: Secondary | ICD-10-CM | POA: Diagnosis not present

## 2020-03-31 DIAGNOSIS — J90 Pleural effusion, not elsewhere classified: Secondary | ICD-10-CM | POA: Diagnosis not present

## 2020-03-31 DIAGNOSIS — R918 Other nonspecific abnormal finding of lung field: Secondary | ICD-10-CM

## 2020-03-31 DIAGNOSIS — I2510 Atherosclerotic heart disease of native coronary artery without angina pectoris: Secondary | ICD-10-CM | POA: Diagnosis not present

## 2020-03-31 MED ORDER — IOPAMIDOL (ISOVUE-300) INJECTION 61%
75.0000 mL | Freq: Once | INTRAVENOUS | Status: AC | PRN
  Administered 2020-03-31: 75 mL via INTRAVENOUS

## 2020-04-01 ENCOUNTER — Telehealth: Payer: Self-pay | Admitting: Physician Assistant

## 2020-04-01 ENCOUNTER — Encounter: Payer: Self-pay | Admitting: *Deleted

## 2020-04-01 DIAGNOSIS — R918 Other nonspecific abnormal finding of lung field: Secondary | ICD-10-CM

## 2020-04-01 DIAGNOSIS — C3490 Malignant neoplasm of unspecified part of unspecified bronchus or lung: Secondary | ICD-10-CM | POA: Diagnosis not present

## 2020-04-01 NOTE — Progress Notes (Signed)
I received referral on Riley Sharp.  Patient has large lung masses bilaterally and hemoptysis.  I will make urgent referral to rad onc and have scheduling call patient to be seen with med onc next week.

## 2020-04-01 NOTE — Telephone Encounter (Signed)
Scheduled appointment per 3/4 new patient referral. Spoke to patient who is aware of appointment date and time.

## 2020-04-04 ENCOUNTER — Other Ambulatory Visit (HOSPITAL_COMMUNITY): Payer: Self-pay | Admitting: Family Medicine

## 2020-04-04 DIAGNOSIS — C3490 Malignant neoplasm of unspecified part of unspecified bronchus or lung: Secondary | ICD-10-CM

## 2020-04-06 NOTE — Progress Notes (Signed)
[place] [phone]   [phone]  CONSULT NOTE  REFERRING PHYSICIAN: Dr. Justin Mend  REASON FOR CONSULTATION:  RUL lung mass, LUL lung mass, and possible mass in the porta hepatis.   HPI Riley Sharp is a 75 y.o. male with a past medical history significant for hypertension, myocardial infarction/stent placement in 2005, hyperlipidemia, BPH, coronary artery disease, cholecystectomy, appendectomy, abdominal aortic aneurysm, basal cell carcinoma, glaucoma is referred to the clinic for evaluation of multifocal lung mass. He is accompanied by his son, Merry Proud, today.   The patient had a office visit performed by his PCP on 03/30/2020.  At that appointment, the patient was endorsing 2 months of hemoptysis. He states he started having blood tinged mucus in January 2022. He reports in the last few weeks, he has not had anymore hemoptysis. He then had a chest x-ray performed on 03/30/20 which revealed new multifocal nodular masslike opacities in the lungs bilaterally (left greater than right) with what appears to be partially loculated left pleural effusion.   He subsequently had a CT scan from 04/02/2019 which demonstrated 4.3 x 3.1 cm spiculated mass in the right upper lobe consistent with malignancy as well as a  4.4 x 3.9 cm lobulated mass in the left upper lobe consistent with malignancy and a loculated left pleural effusion.  There was also a possible 4.3 x 3.3 cm heterogeneous mass in the porta hepatis concerning for malignancy.  He is scheduled for his staging PET scan on 04/14/2020. He is scheduled to see Dr. Lisbeth Renshaw from radiation oncology later this afternoon due to the reported hemoptyosis. He is also scheduled to see Dr. Valeta Harms from pulmonology on 3/16/2.   Overall, the patient reports occasional tightness across his chest.  He also endorses some discomfort in his left chest that radiates to his back near his shoulder blade, especially with sneezing.  He also has the  chest discomfort when he lays on his left side. The patient reports increased dyspnea on exertion and coughing, particularly in the morning which he partially attributes to sinus drainage.  He denies any fever, chills, or night sweats.  He reports that he lost approximately 10 pounds since early January 2022.  He denies any nausea, vomiting, diarrhea, or constipation.  He states that he occasionally has a left upper quadrant abdominal discomfort.  He denies any associated triggers of the discomfort.  He is unable to characterize the pain.  He states that it lasts "not too long" before it subsides without any intervention.  He denies any headache or visual changes.  The patient's family history consists of a mother who passed away at the age of 45 due to old age.  The patient's father had coronary artery disease status post CABG.  The patient's father also had metastatic renal cell carcinoma.  The patient's 1 sister is healthy.  The patient's other sister had a CVA.  The patient used to work as an Audiological scientist.  The patient is widowed and has 1 child.  The patient smoked for approximately 42 years 1 pack of cigarettes per day.  He believes he quit smoking at least 15 years ago and started smoking at the age of 20.  The patient started smoking cigars 1 to 2/day about 5 to 6 days/week starting in 2008.  He stopped smoking cigars in December 2021.  The patient denies any alcohol or drug use.   HPI  Past Medical History:  Diagnosis Date  . AAA (abdominal aortic  aneurysm) ([place])   . CAD (coronary artery disease)   . Cancer ([place])    basal cell carcimona right ear  . Glaucoma   . Hyperlipidemia   . Hypertension   . Myocardial infarction ([place]) 03/2003  . Wears glasses     Past Surgical History:  Procedure Laterality Date  . ABDOMINAL AORTIC ENDOVASCULAR STENT GRAFT N/A 11/14/2015   Procedure: ABDOMINAL AORTIC ENDOVASCULAR STENT GRAFT;  Surgeon: Elam Dutch, MD;  Location: [facility];  Service:  Vascular;  Laterality: N/A;  . APPENDECTOMY    . CHOLECYSTECTOMY  2007   Gall Bladder  . COLONOSCOPY W/ BIOPSIES AND POLYPECTOMY    . EYE SURGERY     laser for glaucoma both eyesn x 2  . heart stent  Feb. 14, 2005    Family History  Problem Relation Age of Onset  . Cancer Father   . Heart disease Father        After age 15  . Hyperlipidemia Father   . Hypertension Father   . Heart attack Father   . Heart disease Sister        After age 24  . Hyperlipidemia Sister   . Heart attack Sister   . Hypertension Other   . Heart disease Other   . Cancer Other     Social History Social History   Tobacco Use  . Smoking status: Current Some Day Smoker    Packs/day: 2.00    Years: 45.00    Pack years: 90.00    Types: Cigars  . Smokeless tobacco: Never Used  . Tobacco comment: 2-3 cigars per day  Vaping Use  . Vaping Use: Never used  Substance Use Topics  . Alcohol use: Yes    Alcohol/week: 0.0 standard drinks    Comment: rare  . Drug use: No    Allergies  Allergen Reactions  . No Known Allergies     Current Outpatient Medications  Medication Sig Dispense Refill  . aspirin EC 81 MG tablet Take 81 mg by mouth daily.    . brimonidine (ALPHAGAN) 0.2 % ophthalmic solution Place 1 drop into both eyes 3 (three) times daily.    . dorzolamide (TRUSOPT) 2 % ophthalmic solution Place 1 drop into both eyes 3 (three) times daily.    Marland Kitchen latanoprost (XALATAN) 0.005 % ophthalmic solution Place 1 drop into both eyes daily.    . metoprolol succinate (TOPROL-XL) 25 MG 24 hr tablet Take 25 mg by mouth daily.    . ramipril (ALTACE) 5 MG tablet Take 5 mg by mouth daily.    . rosuvastatin (CRESTOR) 10 MG tablet Take 10 mg by mouth daily.    . timolol (BETIMOL) 0.5 % ophthalmic solution Place 1 drop into both eyes daily.     No current facility-administered medications for this visit.    REVIEW OF SYSTEMS:   Review of Systems  Constitutional: Positive for unexpected weight loss.   Negative for appetite change, chills, fatigue, and fever. HENT: Negative for mouth sores, nosebleeds, sore throat and trouble swallowing.   Eyes: Negative for eye problems and icterus.  Respiratory: Positive for cough, blood tinged sputum, and dyspnea on exertion.   Cardiovascular: Positive for left Negative for chest pain and leg swelling.  Gastrointestinal: Occasional left upper quadrant discomfort.  Negative for  constipation, diarrhea, nausea and vomiting.  Genitourinary: Negative for bladder incontinence, difficulty urinating, dysuria, frequency and hematuria.   Musculoskeletal: Negative for back pain, gait problem, neck pain and neck stiffness.  Skin:  Negative for itching and rash.  Neurological: Negative for dizziness, extremity weakness, gait problem, headaches, light-headedness and seizures.  Hematological: Negative for adenopathy. Does not bruise/bleed easily.  Psychiatric/Behavioral: Negative for confusion, depression and sleep disturbance. The patient is not nervous/anxious.     PHYSICAL EXAMINATION:  Blood pressure (!) 157/68, pulse 67, temperature (!) 97 F (36.1 C), temperature source Tympanic, resp. rate 18, weight 159 lb 1.6 oz (72.2 kg), SpO2 99 %.  ECOG PERFORMANCE STATUS: 1 - Symptomatic but completely ambulatory  Physical Exam  Constitutional: Oriented to person, place, and time and well-developed, well-nourished, and in no distress.  HENT:  Head: Normocephalic and atraumatic.  Mouth/Throat: Oropharynx is clear and moist. No oropharyngeal exudate.  Eyes: Conjunctivae are normal. Right eye exhibits no discharge. Left eye exhibits no discharge. No scleral icterus.  Neck: Normal range of motion. Neck supple.  Cardiovascular: Normal rate, regular rhythm, normal heart sounds and intact distal pulses.   Pulmonary/Chest: Effort normal and breath sounds normal. No respiratory distress. No wheezes. No rales.  Abdominal: Soft. Bowel sounds are normal. Exhibits no distension  and no mass. There is no tenderness.  Musculoskeletal: Normal range of motion. Exhibits no edema.  Lymphadenopathy:    No cervical adenopathy.  Neurological: Alert and oriented to person, place, and time. Exhibits normal muscle tone. Gait normal. Coordination normal.  Skin: Skin is warm and dry. No rash noted. Not diaphoretic. No erythema. No pallor.  Psychiatric: Mood, memory and judgment normal.  Vitals reviewed.  LABORATORY DATA: Lab Results  Component Value Date   WBC 11.5 (H) 04/07/2020   HGB 14.4 04/07/2020   HCT 44.7 04/07/2020   MCV 91.6 04/07/2020   PLT 224 04/07/2020      Chemistry      Component Value Date/Time   NA 138 04/07/2020 0925   K 5.3 (H) 04/07/2020 0925   CL 105 04/07/2020 0925   CO2 25 04/07/2020 0925   BUN 12 04/07/2020 0925   CREATININE 1.02 04/07/2020 0925      Component Value Date/Time   CALCIUM 9.7 04/07/2020 0925   ALKPHOS 84 04/07/2020 0925   AST 12 (L) 04/07/2020 0925   ALT 13 04/07/2020 0925   BILITOT 0.4 04/07/2020 0925       RADIOGRAPHIC STUDIES: DG Chest 2 View  Result Date: 03/31/2020 CLINICAL DATA:  75 year old male with history of hemoptysis and cough for the past 2 months. EXAM: CHEST - 2 VIEW COMPARISON:  Chest x-ray 11/14/2015. FINDINGS: New multifocal nodular and masslike opacities in the lungs bilaterally (left greater than right) with what appears to be a partially loculated left pleural effusion. No evidence of pulmonary edema. No pneumothorax. Heart size is normal. Prominence of left hilar structures. IMPRESSION: 1. Findings are highly concerning for probable metastatic disease in the chest, as detailed above. Further evaluation with contrast enhanced chest CT is strongly recommended at this time to better evaluate these findings. These results will be called to the ordering clinician or representative by the Radiologist Assistant, and communication documented in the PACS or [employer]. Electronically Signed   By: Vinnie Langton M.D.   On: 03/31/2020 09:16   CT CHEST W CONTRAST  Result Date: 04/01/2020 CLINICAL DATA:  Hemoptysis.  Abnormal chest x-ray. EXAM: CT CHEST WITH CONTRAST TECHNIQUE: Multidetector CT imaging of the chest was performed during intravenous contrast administration. CONTRAST:  73mL ISOVUE-300 IOPAMIDOL (ISOVUE-300) INJECTION 61% COMPARISON:  March 30, 2020. FINDINGS: Cardiovascular: Atherosclerosis of thoracic aorta is noted without aneurysm or dissection. Normal cardiac  size. No pericardial effusion. Coronary artery calcifications are noted. Mediastinum/Nodes: No enlarged mediastinal, hilar, or axillary lymph nodes. Thyroid gland, trachea, and esophagus demonstrate no significant findings. Lungs/Pleura: No pneumothorax is noted. 4.3 x 3.1 cm spiculated mass is noted in right upper lobe consistent with malignancy. 4.4 x 3.9 cm lobulated mass is noted in left upper lobe consistent with malignancy. Loculated left pleural effusion is noted. Upper Abdomen: Possible 4.3 x 3.3 cm heterogeneous mass is noted in the porta hepatis region concerning for malignancy. Musculoskeletal: No chest wall abnormality. No acute or significant osseous findings. IMPRESSION: 1. 4.3 x 3.1 cm spiculated mass is noted in right upper lobe consistent with malignancy. 4.4 x 3.9 cm lobulated mass is noted in left upper lobe consistent with malignancy. Loculated left pleural effusion is noted. PET scan is recommended for further evaluation. 2. Possible 4.3 x 3.3 cm heterogeneous mass is noted in the porta hepatis region concerning for malignancy. 3. Coronary artery calcifications are noted suggesting coronary artery disease. 4. Aortic atherosclerosis. Aortic Atherosclerosis (ICD10-I70.0). These results will be called to the ordering clinician or representative by the Radiologist Assistant, and communication documented in the PACS or [employer]. Electronically Signed   By: Marijo Conception M.D.   On: 04/01/2020 08:33    ASSESSMENT:  This is a very pleasant 75 year old Caucasian male with a right upper lobe lung mass, a left upper lobe lung mass, a loculated left pleural effusion, and a possible mass in the porta hepatis.    PLAN: The patient was seen with Dr. Julien Nordmann today. Dr. Julien Nordmann had a lenghtly discussion with the patient today about his current condition. We do not have tissue diagnosis at this time. Dr. Julien Nordmann recommended that the patient meet with pulmonology next week for consideration of bronchoscopy and biopsy for tissue diagnosis.   He will see radiation today to consider palliative radiotherapy due to his hemoptysis. Unsure if he will require radiation since his hemoptysis improved but we will defer that decision to radiation oncology.   Encouraged him to complete the staging workup with the PET scan which is scheduled for 04/14/20. I have placed a MRI of the brain to rule out metastatic disease to the brain.   We will see the patient back for a follow up visit in 2 weeks for evaluation, to review his results, and for a more detailed discussion about his current condition and recommended treatment options.   His potassium was slightly elevated today. The patient denies excessive intake of potassium rich food. I provided the patient with a handout of foods to avoid with high amounts of potassium. I also have him a handout of low potassium content foods.  The patient voices understanding of current disease status and treatment options and is in agreement with the current care plan.  All questions were answered. The patient knows to call the clinic with any problems, questions or concerns. We can certainly see the patient much sooner if necessary.  Thank you so much for allowing me to participate in the care of Riley Sharp. I will continue to follow up the patient with you and assist in his care.  I spent 40-54 minutes in this encounter  Disclaimer: This note was dictated with voice recognition software.  Similar sounding words can inadvertently be transcribed and may not be corrected upon review.   Anica Alcaraz L Malessa Zartman April 07, 2020, 11:52 AM  ADDENDUM: Hematology/Oncology Attending: I had a face-to-face encounter with the patient.  I reviewed his  record, scan and recommended his care plan.  This is a very pleasant 75 years old white male with history of myocardial infarction status post stent placement in 2005, dyslipidemia, hypertension as well as long history of smoking.  The patient mentioned that he was seen by his primary care physician on March 30, 2020 complaining of intermittent 2 months of hemoptysis.  He noticed the hemoptysis for the first time in January 2022 but currently has no hemoptysis for few weeks.  During his evaluation chest x-ray was performed on March 30, 2020 and showed findings highly concerning for probable metastatic disease in the chest.  This was followed by CT scan of the chest on March 31, 2020 and it showed 4.3 x 3.1 cm spiculated mass noted in the right upper lobe consistent with malignancy.  There was also 4.4 x 3.9 cm lobulated mass in the left upper lobe consistent with malignancy with loculated left pleural effusion.  There was also suspicious 4.3 x 3.3 cm heterogeneous mass in the porta hepatis region concerning for malignancy.  The patient was referred to [place] today for evaluation and recommendation regarding his condition. I had a lengthy discussion with the patient and his son today about his current condition and further investigation to confirm his diagnosis as well as potential treatment options. I recommended for the patient to keep his appointment for the PET scan scheduled on April 14, 2020. The patient was also referred to Dr. Valeta Harms for evaluation and consideration of bronchoscopy and biopsy of the bilateral lung masses. We will also complete the staging work-up by ordering MRI of the brain to rule out brain metastasis. I will arrange for the patient to  come back for follow-up visit in around 2 weeks for more detailed discussion of his treatment options based on the final biopsy result and staging work-up. He was advised to call immediately if he has any other concerning symptoms in the interval.  For the history of hemoptysis he will be seen by radiation oncology for consideration of palliative radiotherapy if needed.  The total time spent in the appointment was 60 minutes. Disclaimer: This note was dictated with voice recognition software. Similar sounding words can inadvertently be transcribed and may be missed upon review. Eilleen Kempf, MD 04/08/20

## 2020-04-06 NOTE — Progress Notes (Signed)
Thoracic Location of Tumor / Histology: RUL Lung  Patient presented with 2 month history of hemoptysis.    MRI Brain:  PET 04/14/2020:  CT Chest 03/31/2020: 4.3 x 3.1 cm spiculated mass is noted in right upper lobe consistent with malignancy. 4.4 x 3.9 cm lobulated mass is noted in left upper lobe consistent with malignancy. Loculated left pleural effusion is noted. PET scan is recommended for further evaluation. 2. Possible 4.3 x 3.3 cm heterogeneous mass is noted in the porta hepatis region concerning for malignancy.  Chest x ray 03/30/2020: New multifocal nodular masslike opacities in the lungs bilaterally (left greater than right) with what appears to be partially loculated left pleural effusion.  Biopsies of   Tobacco/Marijuana/Snuff/ETOH use: Former smoker, quit 12/2019.  Past/Anticipated interventions by cardiothoracic surgery, if any:   Past/Anticipated interventions by medical oncology, if any:  Cassie Heilingoetter/Dr. Julien Nordmann 04/07/2020 10 am -Dr. Julien Nordmann had a lenghtly discussion with the patient today about his current condition. We do not have tissue diagnosis at this time. Dr. Julien Nordmann recommended that the patient meet with pulmonology next week for consideration of bronchoscopy and biopsy for tissue diagnosis.  -He will see radiation today to consider palliative radiotherapy due to his hemoptysis. Unsure if he will require radiation since his hemoptysis improved but we will defer that decision to radiation oncology.    Signs/Symptoms  Weight changes, if any: He has lost about 10 pounds over the last couple of months.  Respiratory complaints, if any: He reports increased SOB with increased activities.  Hemoptysis, if any: He has productive cough with clear mucus, no blood noted in the last couple of weeks.  Pain issues, if any:  He has 5/10 pain in his left chest into his posterior shoulder blade.  SAFETY ISSUES:  Prior radiation? No  Pacemaker/ICD? No  Possible  current pregnancy? n/a  Is the patient on methotrexate? No  Current Complaints / other details:

## 2020-04-07 ENCOUNTER — Inpatient Hospital Stay: Payer: Medicare Other

## 2020-04-07 ENCOUNTER — Encounter: Payer: Self-pay | Admitting: Physician Assistant

## 2020-04-07 ENCOUNTER — Ambulatory Visit
Admission: RE | Admit: 2020-04-07 | Discharge: 2020-04-07 | Disposition: A | Discharge status: Home / Self Care | Payer: Medicare Other | Source: Ambulatory Visit | Attending: Radiation Oncology | Admitting: Radiation Oncology

## 2020-04-07 ENCOUNTER — Inpatient Hospital Stay: Payer: Medicare Other | Attending: Physician Assistant | Admitting: Physician Assistant

## 2020-04-07 ENCOUNTER — Encounter: Payer: Self-pay | Admitting: Radiation Oncology

## 2020-04-07 ENCOUNTER — Other Ambulatory Visit: Payer: Self-pay

## 2020-04-07 VITALS — BP 157/68 | HR 67 | Temp 97.0°F | Resp 18 | Wt 159.1 lb

## 2020-04-07 DIAGNOSIS — F17210 Nicotine dependence, cigarettes, uncomplicated: Secondary | ICD-10-CM | POA: Insufficient documentation

## 2020-04-07 DIAGNOSIS — R042 Hemoptysis: Secondary | ICD-10-CM | POA: Insufficient documentation

## 2020-04-07 DIAGNOSIS — E785 Hyperlipidemia, unspecified: Secondary | ICD-10-CM | POA: Insufficient documentation

## 2020-04-07 DIAGNOSIS — I10 Essential (primary) hypertension: Secondary | ICD-10-CM | POA: Insufficient documentation

## 2020-04-07 DIAGNOSIS — I714 Abdominal aortic aneurysm, without rupture: Secondary | ICD-10-CM | POA: Insufficient documentation

## 2020-04-07 DIAGNOSIS — E875 Hyperkalemia: Secondary | ICD-10-CM | POA: Diagnosis not present

## 2020-04-07 DIAGNOSIS — Z79899 Other long term (current) drug therapy: Secondary | ICD-10-CM | POA: Insufficient documentation

## 2020-04-07 DIAGNOSIS — C3412 Malignant neoplasm of upper lobe, left bronchus or lung: Secondary | ICD-10-CM | POA: Diagnosis not present

## 2020-04-07 DIAGNOSIS — J90 Pleural effusion, not elsewhere classified: Secondary | ICD-10-CM | POA: Insufficient documentation

## 2020-04-07 DIAGNOSIS — Z801 Family history of malignant neoplasm of trachea, bronchus and lung: Secondary | ICD-10-CM | POA: Insufficient documentation

## 2020-04-07 DIAGNOSIS — Z87891 Personal history of nicotine dependence: Secondary | ICD-10-CM | POA: Diagnosis not present

## 2020-04-07 DIAGNOSIS — I2510 Atherosclerotic heart disease of native coronary artery without angina pectoris: Secondary | ICD-10-CM | POA: Insufficient documentation

## 2020-04-07 DIAGNOSIS — Z809 Family history of malignant neoplasm, unspecified: Secondary | ICD-10-CM | POA: Insufficient documentation

## 2020-04-07 DIAGNOSIS — R918 Other nonspecific abnormal finding of lung field: Secondary | ICD-10-CM

## 2020-04-07 DIAGNOSIS — I252 Old myocardial infarction: Secondary | ICD-10-CM | POA: Insufficient documentation

## 2020-04-07 DIAGNOSIS — C7951 Secondary malignant neoplasm of bone: Secondary | ICD-10-CM | POA: Diagnosis not present

## 2020-04-07 DIAGNOSIS — C3411 Malignant neoplasm of upper lobe, right bronchus or lung: Secondary | ICD-10-CM | POA: Insufficient documentation

## 2020-04-07 DIAGNOSIS — N400 Enlarged prostate without lower urinary tract symptoms: Secondary | ICD-10-CM | POA: Diagnosis not present

## 2020-04-07 DIAGNOSIS — Z7982 Long term (current) use of aspirin: Secondary | ICD-10-CM | POA: Insufficient documentation

## 2020-04-07 DIAGNOSIS — Z85820 Personal history of malignant melanoma of skin: Secondary | ICD-10-CM | POA: Diagnosis not present

## 2020-04-07 DIAGNOSIS — H409 Unspecified glaucoma: Secondary | ICD-10-CM | POA: Diagnosis not present

## 2020-04-07 DIAGNOSIS — Z8051 Family history of malignant neoplasm of kidney: Secondary | ICD-10-CM | POA: Diagnosis not present

## 2020-04-07 LAB — CBC WITH DIFFERENTIAL (CANCER CENTER ONLY)
Abs Immature Granulocytes: 0.03 10*3/uL (ref 0.00–0.07)
Basophils Absolute: 0.1 10*3/uL (ref 0.0–0.1)
Basophils Relative: 1 %
Eosinophils Absolute: 0.2 10*3/uL (ref 0.0–0.5)
Eosinophils Relative: 2 %
HCT: 44.7 % (ref 39.0–52.0)
Hemoglobin: 14.4 g/dL (ref 13.0–17.0)
Immature Granulocytes: 0 %
Lymphocytes Relative: 9 %
Lymphs Abs: 1 10*3/uL (ref 0.7–4.0)
MCH: 29.5 pg (ref 26.0–34.0)
MCHC: 32.2 g/dL (ref 30.0–36.0)
MCV: 91.6 fL (ref 80.0–100.0)
Monocytes Absolute: 0.8 10*3/uL (ref 0.1–1.0)
Monocytes Relative: 7 %
Neutro Abs: 9.4 10*3/uL — ABNORMAL HIGH (ref 1.7–7.7)
Neutrophils Relative %: 81 %
Platelet Count: 224 10*3/uL (ref 150–400)
RBC: 4.88 MIL/uL (ref 4.22–5.81)
RDW: 12.8 % (ref 11.5–15.5)
WBC Count: 11.5 10*3/uL — ABNORMAL HIGH (ref 4.0–10.5)
nRBC: 0 % (ref 0.0–0.2)

## 2020-04-07 LAB — CMP (CANCER CENTER ONLY)
ALT: 13 U/L (ref 0–44)
AST: 12 U/L — ABNORMAL LOW (ref 15–41)
Albumin: 3.4 g/dL — ABNORMAL LOW (ref 3.5–5.0)
Alkaline Phosphatase: 84 U/L (ref 38–126)
Anion gap: 8 (ref 5–15)
BUN: 12 mg/dL (ref 8–23)
CO2: 25 mmol/L (ref 22–32)
Calcium: 9.7 mg/dL (ref 8.9–10.3)
Chloride: 105 mmol/L (ref 98–111)
Creatinine: 1.02 mg/dL (ref 0.61–1.24)
GFR, Estimated: >60 mL/min (ref >60)
Glucose, Bld: 100 mg/dL — ABNORMAL HIGH (ref 70–99)
Potassium: 5.3 mmol/L — ABNORMAL HIGH (ref 3.5–5.1)
Sodium: 138 mmol/L (ref 135–145)
Total Bilirubin: 0.4 mg/dL (ref 0.3–1.2)
Total Protein: 7.7 g/dL (ref 6.5–8.1)

## 2020-04-07 NOTE — Patient Instructions (Signed)
-  It was nice meeting you today. We still have to complete the workup process so we know how to best treat you. We are on the right track as most of your appointments are already scheduled.  -If this is lung cancer, it is likely stage IV since it is on both sides of the lung. However, we don't have the details for the treatment at this time because we need to have the results of the biopsy so we know how to best treat you.  -Please keep the appointment for the PET scan next week. This lets [place] see head down and tells [place] about all the areas involved.  -I will also place and order for a brain MRI. Someone from radiology scheduling should call you to schedule this. So please make sure to check your phone and voicemail and return the call to radiology if you miss the call. If you need to reach them, their number is [phone]. -Please keep that appointment with Dr. Valeta Harms next week. He is a pulmonology doctor. When you meet with him, he will discuss in more details the procedure involved with getting a biopsy. This is when they take a piece of the concerning spot in the lung so the pathologist can look at it under the microscope so we know the type of the tumor.  -Follow up in about 2 weeks, hopefully we will have all the results by then so at your next appointment we will know what the game plan is regarding treatment.

## 2020-04-07 NOTE — Progress Notes (Signed)
Radiation Oncology         [phone] ________________________________  Name: Riley Sharp        MRN: [id number]  Date of Service: 04/07/2020 DOB: 1945/10/22  UM:PNTI, Arbie Cookey, MD  Curt Bears, MD     REFERRING PHYSICIAN: Curt Bears, MD   DIAGNOSIS: The encounter diagnosis was Lung nodules.   HISTORY OF PRESENT ILLNESS: Riley Sharp is a 75 y.o. male seen at the request of Dr. Dr. Julien Nordmann for probable advanced lung cancer. The patient presented to his PCP Dr. Justin Mend with hemoptysis and cough x2 months, a chest x-ray was performed on 03/30/2020 showing concerning for masslike opacities in both lungs left greater than right, a subsequent CT scan of the chest with contrast on 03/31/2020 showed a 4.3 cm spiculated mass in the right upper lobe and a 4.4 cm lobulated mass in the left upper lobe these were concerning for malignancy, and a 4.3 cm heterogeneous mass was also noted in the porta hepatis concerning for malignant findings.  He had stigmata of atherosclerotic disease including coronary and aortic calcifications.  He is scheduled to undergo a PET scan next Thursday.  He is going to see Dr. Julien Nordmann as well as the working diagnosis is locally advanced possibly metastatic lung cancer.  The patient is seen today to discuss options of radiotherapy to palliate his hemoptysis.   PREVIOUS RADIATION THERAPY: No   PAST MEDICAL HISTORY:  Past Medical History:  Diagnosis Date  . AAA (abdominal aortic aneurysm) ([place])   . CAD (coronary artery disease)   . Cancer ([place])    basal cell carcimona right ear  . Glaucoma   . Hyperlipidemia   . Hypertension   . Myocardial infarction ([place]) 03/2003  . Wears glasses        PAST SURGICAL HISTORY: Past Surgical History:  Procedure Laterality Date  . ABDOMINAL AORTIC ENDOVASCULAR STENT GRAFT N/A 11/14/2015   Procedure: ABDOMINAL AORTIC ENDOVASCULAR STENT GRAFT;  Surgeon: Elam Dutch, MD;  Location: [facility];  Service: Vascular;  Laterality:  N/A;  . APPENDECTOMY    . CHOLECYSTECTOMY  2007   Gall Bladder  . COLONOSCOPY W/ BIOPSIES AND POLYPECTOMY    . EYE SURGERY     laser for glaucoma both eyesn x 2  . heart stent  Feb. 14, 2005     FAMILY HISTORY:  Family History  Problem Relation Age of Onset  . Cancer Father   . Heart disease Father        After age 44  . Hyperlipidemia Father   . Hypertension Father   . Heart attack Father   . Heart disease Sister        After age 58  . Hyperlipidemia Sister   . Heart attack Sister   . Hypertension Other   . Heart disease Other   . Cancer Other      SOCIAL HISTORY:  reports that he has been smoking cigars. He has a 90.00 pack-year smoking history. He has never used smokeless tobacco. He reports current alcohol use. He reports that he does not use drugs.  The patient is single and lives in [place].  ALLERGIES: No known allergies   MEDICATIONS:  Current Outpatient Medications  Medication Sig Dispense Refill  . aspirin EC 81 MG tablet Take 81 mg by mouth daily.    . brimonidine (ALPHAGAN) 0.2 % ophthalmic solution Place 1 drop into both eyes 3 (three) times daily.    . dorzolamide (TRUSOPT) 2 % ophthalmic solution  Place 1 drop into both eyes 3 (three) times daily.    Marland Kitchen latanoprost (XALATAN) 0.005 % ophthalmic solution Place 1 drop into both eyes daily.    . metoprolol succinate (TOPROL-XL) 25 MG 24 hr tablet Take 25 mg by mouth daily.    . ramipril (ALTACE) 5 MG tablet Take 5 mg by mouth daily.    . rosuvastatin (CRESTOR) 10 MG tablet Take 10 mg by mouth daily.    . timolol (BETIMOL) 0.5 % ophthalmic solution Place 1 drop into both eyes daily.     No current facility-administered medications for this encounter.     REVIEW OF SYSTEMS: On review of systems, the patient reports that he is doing okay. He describes increased shortness of breath over the last month or so when he exerts himself as well as frequent episodes of cough that is productive with clear mucous. A few  weeks ago he saw streaking or spots of blood in his sputum but is not having this currently. He has lost about 10 pounds unintentionally and has also noticed pain in the left shoulder blade region and into the upper left rips just below the collar bone region. He denies weakness, numbness or tingling, or difficulty with changes in strength or sensation on the left trunk or in the upper extremities. No other complaints are verbalized.      PHYSICAL EXAM:  Wt Readings from Last 3 Encounters:  04/07/20 159 lb (72.1 kg)  04/07/20 159 lb 1.6 oz (72.2 kg)  04/09/19 168 lb (76.2 kg)   Temp Readings from Last 3 Encounters:  04/07/20 (!) 97 F (36.1 C) (Tympanic)  04/09/19 97.7 F (36.5 C)  02/06/18 (!) 97.1 F (36.2 C)   BP Readings from Last 3 Encounters:  04/07/20 (!) 157/68  04/09/19 111/65  02/06/18 106/66   Pulse Readings from Last 3 Encounters:  04/07/20 67  04/09/19 (!) 56  02/06/18 (!) 56   Pain Assessment Pain Score: 5  Pain Loc: Chest (Left side of chest into his posterior shoulder)/10 In general this is a well appearing Caucasian male in no acute distress.  He's alert and oriented x4 and appropriate throughout the examination. Cardiopulmonary assessment is negative for acute distress and he exhibits normal effort.     ECOG = 0  0 - Asymptomatic (Fully active, able to carry on all predisease activities without restriction)  1 - Symptomatic but completely ambulatory (Restricted in physically strenuous activity but ambulatory and able to carry out work of a light or sedentary nature. For example, light housework, office work)  2 - Symptomatic, <50% in bed during the day (Ambulatory and capable of all self care but unable to carry out any work activities. Up and about more than 50% of waking hours)  3 - Symptomatic, >50% in bed, but not bedbound (Capable of only limited self-care, confined to bed or chair 50% or more of waking hours)  4 - Bedbound (Completely disabled.  Cannot carry on any self-care. Totally confined to bed or chair)  5 - Death   Eustace Pen MM, Creech RH, Tormey DC, et al. [phone]). "Toxicity and response criteria of the [facility]". [place] Oncol. 5 (6): 649-55    LABORATORY DATA:  Lab Results  Component Value Date   WBC 11.5 (H) 04/07/2020   HGB 14.4 04/07/2020   HCT 44.7 04/07/2020   MCV 91.6 04/07/2020   PLT 224 04/07/2020   Lab Results  Component Value Date   NA 138 04/07/2020  K 5.3 (H) 04/07/2020   CL 105 04/07/2020   CO2 25 04/07/2020   Lab Results  Component Value Date   ALT 13 04/07/2020   AST 12 (L) 04/07/2020   ALKPHOS 84 04/07/2020   BILITOT 0.4 04/07/2020      RADIOGRAPHY: DG Chest 2 View  Result Date: 03/31/2020 CLINICAL DATA:  75 year old male with history of hemoptysis and cough for the past 2 months. EXAM: CHEST - 2 VIEW COMPARISON:  Chest x-ray 11/14/2015. FINDINGS: New multifocal nodular and masslike opacities in the lungs bilaterally (left greater than right) with what appears to be a partially loculated left pleural effusion. No evidence of pulmonary edema. No pneumothorax. Heart size is normal. Prominence of left hilar structures. IMPRESSION: 1. Findings are highly concerning for probable metastatic disease in the chest, as detailed above. Further evaluation with contrast enhanced chest CT is strongly recommended at this time to better evaluate these findings. These results will be called to the ordering clinician or representative by the Radiologist Assistant, and communication documented in the PACS or [employer]. Electronically Signed   By: Vinnie Langton M.D.   On: 03/31/2020 09:16   CT CHEST W CONTRAST  Result Date: 04/01/2020 CLINICAL DATA:  Hemoptysis.  Abnormal chest x-ray. EXAM: CT CHEST WITH CONTRAST TECHNIQUE: Multidetector CT imaging of the chest was performed during intravenous contrast administration. CONTRAST:  18mL ISOVUE-300 IOPAMIDOL (ISOVUE-300) INJECTION  61% COMPARISON:  March 30, 2020. FINDINGS: Cardiovascular: Atherosclerosis of thoracic aorta is noted without aneurysm or dissection. Normal cardiac size. No pericardial effusion. Coronary artery calcifications are noted. Mediastinum/Nodes: No enlarged mediastinal, hilar, or axillary lymph nodes. Thyroid gland, trachea, and esophagus demonstrate no significant findings. Lungs/Pleura: No pneumothorax is noted. 4.3 x 3.1 cm spiculated mass is noted in right upper lobe consistent with malignancy. 4.4 x 3.9 cm lobulated mass is noted in left upper lobe consistent with malignancy. Loculated left pleural effusion is noted. Upper Abdomen: Possible 4.3 x 3.3 cm heterogeneous mass is noted in the porta hepatis region concerning for malignancy. Musculoskeletal: No chest wall abnormality. No acute or significant osseous findings. IMPRESSION: 1. 4.3 x 3.1 cm spiculated mass is noted in right upper lobe consistent with malignancy. 4.4 x 3.9 cm lobulated mass is noted in left upper lobe consistent with malignancy. Loculated left pleural effusion is noted. PET scan is recommended for further evaluation. 2. Possible 4.3 x 3.3 cm heterogeneous mass is noted in the porta hepatis region concerning for malignancy. 3. Coronary artery calcifications are noted suggesting coronary artery disease. 4. Aortic atherosclerosis. Aortic Atherosclerosis (ICD10-I70.0). These results will be called to the ordering clinician or representative by the Radiologist Assistant, and communication documented in the PACS or [employer]. Electronically Signed   By: Marijo Conception M.D.   On: 04/01/2020 08:33       IMPRESSION/PLAN: 1. Probable Stage IV non-small cell lung cancer with bulky disease in the right upper lobe and left upper lobe. Dr. Lisbeth Renshaw discusses the imaging findings and reviews the nature of advanced lung cancer.  We we will follow-up with the results of his PET and MRI scans.  Dr. Lisbeth Renshaw agrees that he undergo tissue sampling to  confirm the suspected diagnosis.  Dr. Lisbeth Renshaw discusses the role of palliative radiotherapy to alleviate hemoptysis and reduce risks of pneumonia and lobe collapse, but since the patient is asymptomatic from hemoptysis at this time, we will follow his course until there is more information to have a more concrete option of treatment.  We discussed the  risks, benefits, short, and long term effects of radiotherapy, as well as the palliative intent, and the patient is interested in proceeding depending on the results of his scans and biopsies. Dr. Lisbeth Renshaw discusses the delivery and logistics of radiotherapy and anticipates a course of 3 weeks of radiotherapy to the chest if we are to proceed. Written consent will be obtained and simulation be scheduled if he proceeds with treatment.   In a visit lasting 60 minutes, greater than 50% of the time was spent face to face discussing the patient's condition, in preparation for the discussion, and coordinating the patient's care.   The above documentation reflects my direct findings during this shared patient visit. Please see the separate note by Dr. Lisbeth Renshaw on this date for the remainder of the patient's plan of care.    Carola Rhine, [facility]   **Disclaimer: This note was dictated with voice recognition software. Similar sounding words can inadvertently be transcribed and this note may contain transcription errors which may not have been corrected upon publication of note.**

## 2020-04-08 ENCOUNTER — Encounter: Payer: Self-pay | Admitting: *Deleted

## 2020-04-08 NOTE — Progress Notes (Signed)
I reached out to [facility] coordinator to get Riley Sharp scan authed with insurance.

## 2020-04-11 ENCOUNTER — Telehealth: Payer: Self-pay | Admitting: Physician Assistant

## 2020-04-11 NOTE — Telephone Encounter (Signed)
Scheduled per los. Called and spoke with patient. Confirmed appt 

## 2020-04-13 ENCOUNTER — Ambulatory Visit (INDEPENDENT_AMBULATORY_CARE_PROVIDER_SITE_OTHER): Payer: Medicare Other | Admitting: Pulmonary Disease

## 2020-04-13 ENCOUNTER — Telehealth: Payer: Self-pay | Admitting: Pulmonary Disease

## 2020-04-13 ENCOUNTER — Encounter: Payer: Self-pay | Admitting: Pulmonary Disease

## 2020-04-13 ENCOUNTER — Other Ambulatory Visit: Payer: Self-pay

## 2020-04-13 VITALS — BP 122/86 | HR 62 | Temp 97.8°F | Ht 67.0 in | Wt 156.5 lb

## 2020-04-13 DIAGNOSIS — R918 Other nonspecific abnormal finding of lung field: Secondary | ICD-10-CM

## 2020-04-13 DIAGNOSIS — J90 Pleural effusion, not elsewhere classified: Secondary | ICD-10-CM

## 2020-04-13 NOTE — Progress Notes (Signed)
Synopsis: Referred in March 2022 for bilateral lung mass, Dr. Earlie Server, PCP: By [name], MD  Subjective:   PATIENT ID: Sanjuana Letters GENDER: male DOB: 04-11-1945, MRN: [id number]  Chief Complaint  Patient presents with  . Consult    Lung cancer.  Pt in lungs when he sneezes or coughs.  This pain has been better in the last few days.      This is a 75 year old gentleman with a past medical history of basal cell carcinoma of the right ear, coronary artery disease, hypertension, hyperlipidemia, AAA.  Patient was a former smoker quit smoking and moved to cigars.  Recently quit cigar smoking back in January.  Patient presented to primary care with coughing up blood small amounts of blood-tinged sputum.  Patient had a CT scan of the chest on 03/31/2020.  This CT scan revealed bilateral upper lobe lung masses both at approximately 4 cm in largest cross-section there was also a small left-sided loculated pleural effusion.  Imaging concerning for an advanced age bronchogenic carcinoma.  Patient was referred for evaluation tissue biopsy as well as removal of the pleural fluid for staging and diagnosis of what appears to be an advanced stage malignancy.  Patient has already established care with medical oncology as well as radiation oncology.  Patient presents today to discuss bronchoscopy and tissue sampling.  Additionally he has a PET scan that has been ordered as well as an MRI of the brain to complete staging.   Past Medical History:  Diagnosis Date  . AAA (abdominal aortic aneurysm) ([place])   . CAD (coronary artery disease)   . Cancer ([place])    basal cell carcimona right ear  . Glaucoma   . Hyperlipidemia   . Hypertension   . Myocardial infarction ([place]) 03/2003  . Wears glasses      Family History  Problem Relation Age of Onset  . Cancer Father   . Heart disease Father        After age 16  . Hyperlipidemia Father   . Hypertension Father   . Heart attack Father   . Heart disease Sister         After age 84  . Hyperlipidemia Sister   . Heart attack Sister   . Hypertension Other   . Heart disease Other   . Cancer Other      Past Surgical History:  Procedure Laterality Date  . ABDOMINAL AORTIC ENDOVASCULAR STENT GRAFT N/A 11/14/2015   Procedure: ABDOMINAL AORTIC ENDOVASCULAR STENT GRAFT;  Surgeon: Elam Dutch, MD;  Location: [facility];  Service: Vascular;  Laterality: N/A;  . APPENDECTOMY    . CHOLECYSTECTOMY  2007   Gall Bladder  . COLONOSCOPY W/ BIOPSIES AND POLYPECTOMY    . EYE SURGERY     laser for glaucoma both eyesn x 2  . heart stent  Feb. 14, 2005    Social History   Socioeconomic History  . Marital status: Single    Spouse name: Not on file  . Number of children: Not on file  . Years of education: Not on file  . Highest education level: Not on file  Occupational History  . Not on file  Tobacco Use  . Smoking status: Former Smoker    Packs/day: 2.00    Years: 45.00    Pack years: 90.00    Types: Cigars  . Smokeless tobacco: Never Used  . Tobacco comment: 2-3 cigars per day--quit Jan 2022  Vaping Use  . Vaping Use: Never  used  Substance and Sexual Activity  . Alcohol use: Yes    Alcohol/week: 0.0 standard drinks    Comment: rare  . Drug use: No  . Sexual activity: Not on file  Other Topics Concern  . Not on file  Social History Narrative  . Not on file   Social Determinants of Health   Financial Resource Strain: Not on file  Food Insecurity: Not on file  Transportation Needs: Not on file  Physical Activity: Not on file  Stress: Not on file  Social Connections: Not on file  Intimate Partner Violence: Not on file     Allergies  Allergen Reactions  . No Known Allergies      Outpatient Medications Prior to Visit  Medication Sig Dispense Refill  . aspirin EC 81 MG tablet Take 81 mg by mouth daily.    . brimonidine (ALPHAGAN) 0.2 % ophthalmic solution Place 1 drop into both eyes 3 (three) times daily.    . dorzolamide (TRUSOPT)  2 % ophthalmic solution Place 1 drop into both eyes 3 (three) times daily.    Marland Kitchen latanoprost (XALATAN) 0.005 % ophthalmic solution Place 1 drop into both eyes daily.    . metoprolol succinate (TOPROL-XL) 25 MG 24 hr tablet Take 25 mg by mouth daily.    . ramipril (ALTACE) 5 MG tablet Take 5 mg by mouth daily.    . rosuvastatin (CRESTOR) 10 MG tablet Take 10 mg by mouth daily.    . timolol (BETIMOL) 0.5 % ophthalmic solution Place 1 drop into both eyes daily.     No facility-administered medications prior to visit.    Review of Systems  Constitutional: Negative for chills, fever, malaise/fatigue and weight loss.  HENT: Negative for hearing loss, sore throat and tinnitus.   Eyes: Negative for blurred vision and double vision.  Respiratory: Positive for cough, hemoptysis and shortness of breath. Negative for sputum production, wheezing and stridor.   Cardiovascular: Negative for chest pain, palpitations, orthopnea, leg swelling and PND.  Gastrointestinal: Negative for abdominal pain, constipation, diarrhea, heartburn, nausea and vomiting.  Genitourinary: Negative for dysuria, hematuria and urgency.  Musculoskeletal: Negative for joint pain and myalgias.  Skin: Negative for itching and rash.  Neurological: Negative for dizziness, tingling, weakness and headaches.  Endo/Heme/Allergies: Negative for environmental allergies. Does not bruise/bleed easily.  Psychiatric/Behavioral: Negative for depression. The patient is not nervous/anxious and does not have insomnia.   All other systems reviewed and are negative.    Objective:  Physical Exam Vitals reviewed.  Constitutional:      General: He is not in acute distress.    Appearance: He is well-developed.  HENT:     Head: Normocephalic and atraumatic.  Eyes:     General: No scleral icterus.    Conjunctiva/sclera: Conjunctivae normal.     Pupils: Pupils are equal, round, and reactive to light.  Neck:     Vascular: No JVD.     Trachea: No  tracheal deviation.  Cardiovascular:     Rate and Rhythm: Normal rate and regular rhythm.     Heart sounds: Normal heart sounds. No murmur heard.   Pulmonary:     Effort: Pulmonary effort is normal. No tachypnea, accessory muscle usage or respiratory distress.     Breath sounds: No stridor. No wheezing, rhonchi or rales.     Comments: Diminished breath sounds in the left base compared to the right. Abdominal:     General: Bowel sounds are normal. There is no distension.  Palpations: Abdomen is soft.     Tenderness: There is no abdominal tenderness.  Musculoskeletal:        General: No tenderness.     Cervical back: Neck supple.  Lymphadenopathy:     Cervical: No cervical adenopathy.  Skin:    General: Skin is warm and dry.     Capillary Refill: Capillary refill takes less than 2 seconds.     Findings: No rash.  Neurological:     Mental Status: He is alert and oriented to person, place, and time.  Psychiatric:        Behavior: Behavior normal.      Vitals:   04/13/20 1412  BP: 122/86  Pulse: 62  Temp: 97.8 F (36.6 C)  TempSrc: Tympanic  SpO2: 98%  Weight: 156 lb 8 oz (71 kg)  Height: 5\' 7"  (1.702 m)   98% on RA BMI Readings from Last 3 Encounters:  04/13/20 24.51 kg/m  04/07/20 25.28 kg/m  04/07/20 24.19 kg/m   Wt Readings from Last 3 Encounters:  04/13/20 156 lb 8 oz (71 kg)  04/07/20 159 lb (72.1 kg)  04/07/20 159 lb 1.6 oz (72.2 kg)     CBC    Component Value Date/Time   WBC 11.5 (H) 04/07/2020 0925   WBC 7.6 11/15/2015 0440   RBC 4.88 04/07/2020 0925   HGB 14.4 04/07/2020 0925   HCT 44.7 04/07/2020 0925   PLT 224 04/07/2020 0925   MCV 91.6 04/07/2020 0925   MCH 29.5 04/07/2020 0925   MCHC 32.2 04/07/2020 0925   RDW 12.8 04/07/2020 0925   LYMPHSABS 1.0 04/07/2020 0925   MONOABS 0.8 04/07/2020 0925   EOSABS 0.2 04/07/2020 0925   BASOSABS 0.1 04/07/2020 0925     Chest Imaging: CT chest 03/31/2020: Bilateral 4 cm right upper lobe and  left upper lobe lung masses concerning for malignancy.  Additionally has a loculated left-sided pleural effusion which also appears potentially malignant in nature. The patient's images have been independently reviewed by me.    Pulmonary Functions Testing Results: No flowsheet data found.  FeNO:   Pathology:   Echocardiogram:   Heart Catheterization:     Assessment & Plan:     ICD-10-CM   1. Lung mass  R91.8 Ambulatory referral to Pulmonology    Procedural/ Surgical Case Request: VIDEO BRONCHOSCOPY WITH ENDOBRONCHIAL NAVIGATION, VIDEO BRONCHOSCOPY WITH ENDOBRONCHIAL ULTRASOUND, THORACENTESIS  2. Mass of upper lobe of right lung  R91.8   3. Mass of upper lobe of left lung  R91.8   4. Pleural effusion  J90     Discussion:  This is a 75 year old gentleman, with what appears to be an advanced age bronchogenic carcinoma.  He is a former smoker.  He has bilateral lung disease with a loculated left-sided pleural effusion.  Plan: Today in the office we discussed the risk benefits and alternatives of proceeding with video bronchoscopy and tissue sampling. We will plan for navigational bronchoscopy as well as possible endobronchial ultrasound if needed. We also plan for a thoracentesis to be sent for cytology evaluation.  Plan tentative date for 04/19/2018 2 in the afternoon. Patient is not on antianticoagulants or antiplatelets.  We will have the recent CT scan of the chest converted for super D imaging for plan for navigation.  Patient is agreeable to this plan we discussed risk benefits as well as reviewed images today in the office.   Current Outpatient Medications:  .  aspirin EC 81 MG tablet, Take 81 mg by mouth  daily., Disp: , Rfl:  .  brimonidine (ALPHAGAN) 0.2 % ophthalmic solution, Place 1 drop into both eyes 3 (three) times daily., Disp: , Rfl:  .  dorzolamide (TRUSOPT) 2 % ophthalmic solution, Place 1 drop into both eyes 3 (three) times daily., Disp: , Rfl:  .   latanoprost (XALATAN) 0.005 % ophthalmic solution, Place 1 drop into both eyes daily., Disp: , Rfl:  .  metoprolol succinate (TOPROL-XL) 25 MG 24 hr tablet, Take 25 mg by mouth daily., Disp: , Rfl:  .  ramipril (ALTACE) 5 MG tablet, Take 5 mg by mouth daily., Disp: , Rfl:  .  rosuvastatin (CRESTOR) 10 MG tablet, Take 10 mg by mouth daily., Disp: , Rfl:  .  timolol (BETIMOL) 0.5 % ophthalmic solution, Place 1 drop into both eyes daily., Disp: , Rfl:   I spent 62 minutes dedicated to the care of this patient on the date of this encounter to include pre-visit review of records, face-to-face time with the patient discussing conditions above, post visit ordering of testing, clinical documentation with the electronic health record, making appropriate referrals as documented, and communicating necessary findings to members of the patients care team.   [name], DO [facility] 04/13/2020 2:21 PM

## 2020-04-13 NOTE — Patient Instructions (Addendum)
Thank you for visiting Dr. Valeta Harms at [facility]. Today we recommend the following:  Orders Placed This Encounter  Procedures  . Procedural/ Surgical Case Request: VIDEO BRONCHOSCOPY WITH ENDOBRONCHIAL NAVIGATION, VIDEO BRONCHOSCOPY WITH ENDOBRONCHIAL ULTRASOUND, THORACENTESIS  . Ambulatory referral to Pulmonology   Bronchoscopy on Monday  Please expect phone calls from scheduling and pre-op services.   Return in about 2 months (around 06/13/2020) for with APP or Dr. Valeta Harms.    Please do your part to reduce the spread of COVID-19.

## 2020-04-13 NOTE — Telephone Encounter (Signed)
Pt has been given all appointment info.

## 2020-04-13 NOTE — Telephone Encounter (Signed)
The following has been scheduled:  COVID TEST 3/19 @ 11:20 ENB 3/21 @ 1 pm  Martie w/ [facility] is burning disk for Super D CT & will send via courier.   I have left VM for pt to return my call to provide him this info.

## 2020-04-13 NOTE — H&P (View-Only) (Signed)
Synopsis: Referred in March 2022 for bilateral lung mass, Dr. Earlie Server, PCP: By [name], MD  Subjective:   PATIENT ID: Riley Sharp GENDER: male DOB: Dec 27, 1945, MRN: [id number]  Chief Complaint  Patient presents with  . Consult    Lung cancer.  Pt in lungs when he sneezes or coughs.  This pain has been better in the last few days.      This is a 75 year old gentleman with a past medical history of basal cell carcinoma of the right ear, coronary artery disease, hypertension, hyperlipidemia, AAA.  Patient was a former smoker quit smoking and moved to cigars.  Recently quit cigar smoking back in January.  Patient presented to primary care with coughing up blood small amounts of blood-tinged sputum.  Patient had a CT scan of the chest on 03/31/2020.  This CT scan revealed bilateral upper lobe lung masses both at approximately 4 cm in largest cross-section there was also a small left-sided loculated pleural effusion.  Imaging concerning for an advanced age bronchogenic carcinoma.  Patient was referred for evaluation tissue biopsy as well as removal of the pleural fluid for staging and diagnosis of what appears to be an advanced stage malignancy.  Patient has already established care with medical oncology as well as radiation oncology.  Patient presents today to discuss bronchoscopy and tissue sampling.  Additionally he has a PET scan that has been ordered as well as an MRI of the brain to complete staging.   Past Medical History:  Diagnosis Date  . AAA (abdominal aortic aneurysm) ([place])   . CAD (coronary artery disease)   . Cancer ([place])    basal cell carcimona right ear  . Glaucoma   . Hyperlipidemia   . Hypertension   . Myocardial infarction ([place]) 03/2003  . Wears glasses      Family History  Problem Relation Age of Onset  . Cancer Father   . Heart disease Father        After age 77  . Hyperlipidemia Father   . Hypertension Father   . Heart attack Father   . Heart disease Sister         After age 29  . Hyperlipidemia Sister   . Heart attack Sister   . Hypertension Other   . Heart disease Other   . Cancer Other      Past Surgical History:  Procedure Laterality Date  . ABDOMINAL AORTIC ENDOVASCULAR STENT GRAFT N/A 11/14/2015   Procedure: ABDOMINAL AORTIC ENDOVASCULAR STENT GRAFT;  Surgeon: Elam Dutch, MD;  Location: [facility];  Service: Vascular;  Laterality: N/A;  . APPENDECTOMY    . CHOLECYSTECTOMY  2007   Gall Bladder  . COLONOSCOPY W/ BIOPSIES AND POLYPECTOMY    . EYE SURGERY     laser for glaucoma both eyesn x 2  . heart stent  Feb. 14, 2005    Social History   Socioeconomic History  . Marital status: Single    Spouse name: Not on file  . Number of children: Not on file  . Years of education: Not on file  . Highest education level: Not on file  Occupational History  . Not on file  Tobacco Use  . Smoking status: Former Smoker    Packs/day: 2.00    Years: 45.00    Pack years: 90.00    Types: Cigars  . Smokeless tobacco: Never Used  . Tobacco comment: 2-3 cigars per day--quit Jan 2022  Vaping Use  . Vaping Use: Never  used  Substance and Sexual Activity  . Alcohol use: Yes    Alcohol/week: 0.0 standard drinks    Comment: rare  . Drug use: No  . Sexual activity: Not on file  Other Topics Concern  . Not on file  Social History Narrative  . Not on file   Social Determinants of Health   Financial Resource Strain: Not on file  Food Insecurity: Not on file  Transportation Needs: Not on file  Physical Activity: Not on file  Stress: Not on file  Social Connections: Not on file  Intimate Partner Violence: Not on file     Allergies  Allergen Reactions  . No Known Allergies      Outpatient Medications Prior to Visit  Medication Sig Dispense Refill  . aspirin EC 81 MG tablet Take 81 mg by mouth daily.    . brimonidine (ALPHAGAN) 0.2 % ophthalmic solution Place 1 drop into both eyes 3 (three) times daily.    . dorzolamide (TRUSOPT)  2 % ophthalmic solution Place 1 drop into both eyes 3 (three) times daily.    Marland Kitchen latanoprost (XALATAN) 0.005 % ophthalmic solution Place 1 drop into both eyes daily.    . metoprolol succinate (TOPROL-XL) 25 MG 24 hr tablet Take 25 mg by mouth daily.    . ramipril (ALTACE) 5 MG tablet Take 5 mg by mouth daily.    . rosuvastatin (CRESTOR) 10 MG tablet Take 10 mg by mouth daily.    . timolol (BETIMOL) 0.5 % ophthalmic solution Place 1 drop into both eyes daily.     No facility-administered medications prior to visit.    Review of Systems  Constitutional: Negative for chills, fever, malaise/fatigue and weight loss.  HENT: Negative for hearing loss, sore throat and tinnitus.   Eyes: Negative for blurred vision and double vision.  Respiratory: Positive for cough, hemoptysis and shortness of breath. Negative for sputum production, wheezing and stridor.   Cardiovascular: Negative for chest pain, palpitations, orthopnea, leg swelling and PND.  Gastrointestinal: Negative for abdominal pain, constipation, diarrhea, heartburn, nausea and vomiting.  Genitourinary: Negative for dysuria, hematuria and urgency.  Musculoskeletal: Negative for joint pain and myalgias.  Skin: Negative for itching and rash.  Neurological: Negative for dizziness, tingling, weakness and headaches.  Endo/Heme/Allergies: Negative for environmental allergies. Does not bruise/bleed easily.  Psychiatric/Behavioral: Negative for depression. The patient is not nervous/anxious and does not have insomnia.   All other systems reviewed and are negative.    Objective:  Physical Exam Vitals reviewed.  Constitutional:      General: He is not in acute distress.    Appearance: He is well-developed.  HENT:     Head: Normocephalic and atraumatic.  Eyes:     General: No scleral icterus.    Conjunctiva/sclera: Conjunctivae normal.     Pupils: Pupils are equal, round, and reactive to light.  Neck:     Vascular: No JVD.     Trachea: No  tracheal deviation.  Cardiovascular:     Rate and Rhythm: Normal rate and regular rhythm.     Heart sounds: Normal heart sounds. No murmur heard.   Pulmonary:     Effort: Pulmonary effort is normal. No tachypnea, accessory muscle usage or respiratory distress.     Breath sounds: No stridor. No wheezing, rhonchi or rales.     Comments: Diminished breath sounds in the left base compared to the right. Abdominal:     General: Bowel sounds are normal. There is no distension.  Palpations: Abdomen is soft.     Tenderness: There is no abdominal tenderness.  Musculoskeletal:        General: No tenderness.     Cervical back: Neck supple.  Lymphadenopathy:     Cervical: No cervical adenopathy.  Skin:    General: Skin is warm and dry.     Capillary Refill: Capillary refill takes less than 2 seconds.     Findings: No rash.  Neurological:     Mental Status: He is alert and oriented to person, place, and time.  Psychiatric:        Behavior: Behavior normal.      Vitals:   04/13/20 1412  BP: 122/86  Pulse: 62  Temp: 97.8 F (36.6 C)  TempSrc: Tympanic  SpO2: 98%  Weight: 156 lb 8 oz (71 kg)  Height: 5\' 7"  (1.702 m)   98% on RA BMI Readings from Last 3 Encounters:  04/13/20 24.51 kg/m  04/07/20 25.28 kg/m  04/07/20 24.19 kg/m   Wt Readings from Last 3 Encounters:  04/13/20 156 lb 8 oz (71 kg)  04/07/20 159 lb (72.1 kg)  04/07/20 159 lb 1.6 oz (72.2 kg)     CBC    Component Value Date/Time   WBC 11.5 (H) 04/07/2020 0925   WBC 7.6 11/15/2015 0440   RBC 4.88 04/07/2020 0925   HGB 14.4 04/07/2020 0925   HCT 44.7 04/07/2020 0925   PLT 224 04/07/2020 0925   MCV 91.6 04/07/2020 0925   MCH 29.5 04/07/2020 0925   MCHC 32.2 04/07/2020 0925   RDW 12.8 04/07/2020 0925   LYMPHSABS 1.0 04/07/2020 0925   MONOABS 0.8 04/07/2020 0925   EOSABS 0.2 04/07/2020 0925   BASOSABS 0.1 04/07/2020 0925     Chest Imaging: CT chest 03/31/2020: Bilateral 4 cm right upper lobe and  left upper lobe lung masses concerning for malignancy.  Additionally has a loculated left-sided pleural effusion which also appears potentially malignant in nature. The patient's images have been independently reviewed by me.    Pulmonary Functions Testing Results: No flowsheet data found.  FeNO:   Pathology:   Echocardiogram:   Heart Catheterization:     Assessment & Plan:     ICD-10-CM   1. Lung mass  R91.8 Ambulatory referral to Pulmonology    Procedural/ Surgical Case Request: VIDEO BRONCHOSCOPY WITH ENDOBRONCHIAL NAVIGATION, VIDEO BRONCHOSCOPY WITH ENDOBRONCHIAL ULTRASOUND, THORACENTESIS  2. Mass of upper lobe of right lung  R91.8   3. Mass of upper lobe of left lung  R91.8   4. Pleural effusion  J90     Discussion:  This is a 75 year old gentleman, with what appears to be an advanced age bronchogenic carcinoma.  He is a former smoker.  He has bilateral lung disease with a loculated left-sided pleural effusion.  Plan: Today in the office we discussed the risk benefits and alternatives of proceeding with video bronchoscopy and tissue sampling. We will plan for navigational bronchoscopy as well as possible endobronchial ultrasound if needed. We also plan for a thoracentesis to be sent for cytology evaluation.  Plan tentative date for 04/19/2018 2 in the afternoon. Patient is not on antianticoagulants or antiplatelets.  We will have the recent CT scan of the chest converted for super D imaging for plan for navigation.  Patient is agreeable to this plan we discussed risk benefits as well as reviewed images today in the office.   Current Outpatient Medications:  .  aspirin EC 81 MG tablet, Take 81 mg by mouth  daily., Disp: , Rfl:  .  brimonidine (ALPHAGAN) 0.2 % ophthalmic solution, Place 1 drop into both eyes 3 (three) times daily., Disp: , Rfl:  .  dorzolamide (TRUSOPT) 2 % ophthalmic solution, Place 1 drop into both eyes 3 (three) times daily., Disp: , Rfl:  .   latanoprost (XALATAN) 0.005 % ophthalmic solution, Place 1 drop into both eyes daily., Disp: , Rfl:  .  metoprolol succinate (TOPROL-XL) 25 MG 24 hr tablet, Take 25 mg by mouth daily., Disp: , Rfl:  .  ramipril (ALTACE) 5 MG tablet, Take 5 mg by mouth daily., Disp: , Rfl:  .  rosuvastatin (CRESTOR) 10 MG tablet, Take 10 mg by mouth daily., Disp: , Rfl:  .  timolol (BETIMOL) 0.5 % ophthalmic solution, Place 1 drop into both eyes daily., Disp: , Rfl:   I spent 62 minutes dedicated to the care of this patient on the date of this encounter to include pre-visit review of records, face-to-face time with the patient discussing conditions above, post visit ordering of testing, clinical documentation with the electronic health record, making appropriate referrals as documented, and communicating necessary findings to members of the patients care team.   [name], DO [facility] 04/13/2020 2:21 PM

## 2020-04-14 ENCOUNTER — Ambulatory Visit (HOSPITAL_COMMUNITY)
Admission: RE | Admit: 2020-04-14 | Discharge: 2020-04-14 | Disposition: A | Discharge status: Home / Self Care | Payer: Medicare Other | Source: Ambulatory Visit | Attending: Family Medicine | Admitting: Family Medicine

## 2020-04-14 ENCOUNTER — Ambulatory Visit (HOSPITAL_COMMUNITY): Payer: Medicare Other

## 2020-04-14 DIAGNOSIS — C3490 Malignant neoplasm of unspecified part of unspecified bronchus or lung: Secondary | ICD-10-CM

## 2020-04-15 ENCOUNTER — Ambulatory Visit (INDEPENDENT_AMBULATORY_CARE_PROVIDER_SITE_OTHER)
Admission: RE | Admit: 2020-04-15 | Discharge: 2020-04-15 | Disposition: A | Discharge status: Home / Self Care | Payer: Medicare Other | Source: Ambulatory Visit | Attending: Vascular Surgery | Admitting: Vascular Surgery

## 2020-04-15 ENCOUNTER — Encounter: Payer: Self-pay | Admitting: Physician Assistant

## 2020-04-15 ENCOUNTER — Encounter (HOSPITAL_COMMUNITY): Payer: Self-pay | Admitting: Pulmonary Disease

## 2020-04-15 ENCOUNTER — Ambulatory Visit (INDEPENDENT_AMBULATORY_CARE_PROVIDER_SITE_OTHER): Payer: Medicare Other | Admitting: Physician Assistant

## 2020-04-15 ENCOUNTER — Other Ambulatory Visit: Payer: Self-pay

## 2020-04-15 ENCOUNTER — Ambulatory Visit (HOSPITAL_COMMUNITY)
Admission: RE | Admit: 2020-04-15 | Discharge: 2020-04-15 | Disposition: A | Discharge status: Home / Self Care | Payer: Medicare Other | Source: Ambulatory Visit | Attending: Vascular Surgery | Admitting: Vascular Surgery

## 2020-04-15 VITALS — BP 102/63 | HR 71 | Temp 98.2°F | Resp 20 | Ht 67.0 in | Wt 155.6 lb

## 2020-04-15 DIAGNOSIS — Z20822 Contact with and (suspected) exposure to covid-19: Secondary | ICD-10-CM | POA: Diagnosis not present

## 2020-04-15 DIAGNOSIS — I714 Abdominal aortic aneurysm, without rupture, unspecified: Secondary | ICD-10-CM

## 2020-04-15 DIAGNOSIS — R918 Other nonspecific abnormal finding of lung field: Secondary | ICD-10-CM | POA: Insufficient documentation

## 2020-04-15 DIAGNOSIS — R0989 Other specified symptoms and signs involving the circulatory and respiratory systems: Secondary | ICD-10-CM | POA: Insufficient documentation

## 2020-04-15 DIAGNOSIS — Z95828 Presence of other vascular implants and grafts: Secondary | ICD-10-CM | POA: Diagnosis not present

## 2020-04-15 NOTE — Progress Notes (Signed)
PCP - Dr [name] Cardiologist - Dr Clarene Critchley  Chest x-ray - 03/30/20 (2V) EKG - DOS 04/18/20 Stress Test - 02/27/07 ECHO - n/a Cardiac Cath - n/a  Aspirin Instructions: Follow your surgeon's instructions on when to stop aspirin prior to surgery,  If no instructions were given by your surgeon then you will need to call the office for those instructions.  Anesthesia review: Yes  STOP now taking any Aspirin (unless otherwise instructed by your surgeon), Aleve, Naproxen, Ibuprofen, Motrin, Advil, Goody's, BC's, all herbal medications, fish oil, and all vitamins.   Coronavirus Screening Covid test is scheduled on 04/16/20. Do you have any of the following symptoms:  Cough yes/no: No Fever (>100.96F)  yes/no: No Runny nose yes/no: No Sore throat yes/no: No Difficulty breathing/shortness of breath  yes/no: No  Have you traveled in the last 14 days and where? yes/no: No  Patient verbalized understanding of instructions that were given via phone.

## 2020-04-15 NOTE — Progress Notes (Signed)
Established [facility]   History of Present Illness   Riley Sharp is a 75 y.o. ([birth year]) male who presents for routine follow up s/p endovascular repair of abdominal aortic aneurysm as well as bilateral common iliac artery aneurysms by Dr. Oneida Alar in 2017.  At the time of repair the AAA measured around 5.3 cm.  Patient denies any new or changing abdominal or back pain.  He also denies any claudication, rest pain, or nonhealing wounds of bilateral lower extremities.  He is on aspirin and statin daily.  Patient is somewhat emotional today as he was just diagnosed with lung cancer last week.  He has appointments for PET scan, brain scan, and bronchoscopy with biopsy in the near future.  He is a former smoker however states he smoked when he turned 17 up until 2005.  Current Outpatient Medications  Medication Sig Dispense Refill  . aspirin EC 81 MG tablet Take 81 mg by mouth daily.    . brimonidine (ALPHAGAN) 0.2 % ophthalmic solution Place 1 drop into both eyes 3 (three) times daily.    . dorzolamide (TRUSOPT) 2 % ophthalmic solution Place 1 drop into both eyes 3 (three) times daily.    Marland Kitchen latanoprost (XALATAN) 0.005 % ophthalmic solution Place 1 drop into both eyes at bedtime.    . metoprolol succinate (TOPROL-XL) 25 MG 24 hr tablet Take 25 mg by mouth daily.    . ramipril (ALTACE) 5 MG tablet Take 5 mg by mouth daily.    . rosuvastatin (CRESTOR) 10 MG tablet Take 10 mg by mouth daily.    . timolol (TIMOPTIC) 0.5 % ophthalmic solution Place 1 drop into both eyes daily.     No current facility-administered medications for this visit.    REVIEW OF SYSTEMS (negative unless checked):   Cardiac:  []  Chest pain or chest pressure? []  Shortness of breath upon activity? []  Shortness of breath when lying flat? []  Irregular heart rhythm?  Vascular:  []  Pain in calf, thigh, or hip brought on by walking? []  Pain in feet at night that wakes you up from your sleep? []  Blood clot in your veins? []   Leg swelling?  Pulmonary:  []  Oxygen at home? []  Productive cough? []  Wheezing?  Neurologic:  []  Sudden weakness in arms or legs? []  Sudden numbness in arms or legs? []  Sudden onset of difficult speaking or slurred speech? []  Temporary loss of vision in one eye? []  Problems with dizziness?  Gastrointestinal:  []  Blood in stool? []  Vomited blood?  Genitourinary:  []  Burning when urinating? []  Blood in urine?  Psychiatric:  []  Major depression  Hematologic:  []  Bleeding problems? []  Problems with blood clotting?  Dermatologic:  []  Rashes or ulcers?  Constitutional:  []  Fever or chills?  Ear/Nose/Throat:  []  Change in hearing? []  Nose bleeds? []  Sore throat?  Musculoskeletal:  []  Back pain? []  Joint pain? []  Muscle pain?   Physical Examination   Vitals:   04/15/20 0847  BP: 102/63  Pulse: 71  Resp: 20  Temp: 98.2 F (36.8 C)  TempSrc: Temporal  SpO2: 100%  Weight: 155 lb 9.6 oz (70.6 kg)  Height: 5\' 7"  (1.702 m)   Body mass index is 24.37 kg/m.  General:  WDWN in NAD; vital signs documented above Gait: Not observed HENT: WNL, normocephalic Pulmonary: normal non-labored breathing Cardiac: regular HR Abdomen: soft, NT, no masses Skin: without rashes Vascular Exam/Pulses:  Right Left  Radial 2+ (normal) 2+ (normal)  DP absent absent  PT 2+ (normal) 2+ (normal)   Extremities: without ischemic changes, without Gangrene , without cellulitis; without open wounds;  Musculoskeletal: no muscle wasting or atrophy  Neurologic: A&O X 3;  No focal weakness or paresthesias are detected Psychiatric:  The pt has Normal affect.  Non-Invasive Vascular Imaging   EVAR Duplex   AAA sac size: 4.8 cm x 5.27 cm  no endoleak detected   Medical Decision Making   Riley Sharp is a 75 y.o. male who presents for surveillance of endovascular repair of abdominal aortic aneurysm as well as bilateral common iliac aneurysms in 2017   AAA sac size unchanged  since last ultrasound 1 year ago; maximum diameter measuring 5.27 cm  Duplex also negative for endoleaks  Bilateral lower extremities well perfused with 2+ palpable PT pulses bilaterally  Continue aspirin and statin daily  Recheck EVAR duplex in 1 year   Dagoberto Ligas PA-C [facility] Office: [phone]  Clinic MD: Donzetta Matters

## 2020-04-15 NOTE — Anesthesia Preprocedure Evaluation (Addendum)
Anesthesia Evaluation    Reviewed: Allergy & Precautions, Patient's Chart, lab work & pertinent test results, reviewed documented beta blocker date and time   History of Anesthesia Complications Negative for: history of anesthetic complications  Airway Mallampati: II  TM Distance: >3 FB Neck ROM: Full    Dental  (+) Teeth Intact   Pulmonary Patient abstained from smoking., former smoker,  CT chest 03/31/20:  1. 4.3 x 3.1 cm spiculated mass in right upper lobe consistent with malignancy. 4.4 x 3.9 cm lobulated mass is noted in left upper lobe consistent with malignancy. Loculated left pleural effusion is noted.  2. Possible 4.3 x 3.3 cm heterogeneous mass is noted in the porta hepatis region concerning for malignancy   Pulmonary exam normal        Cardiovascular hypertension, Pt. on medications and Pt. on home beta blockers + CAD, + Past MI and + Cardiac Stents (2005)   Rhythm:Regular Rate:Normal  AAA and B common iliac aneurysms (s/p EVAR 2017)   Neuro/Psych negative neurological ROS  negative psych ROS   GI/Hepatic negative GI ROS, Neg liver ROS,   Endo/Other  negative endocrine ROS  Renal/GU negative Renal ROS  negative genitourinary   Musculoskeletal negative musculoskeletal ROS (+)   Abdominal (+)  Abdomen: soft. Bowel sounds: normal.  Peds  Hematology negative hematology ROS (+)   Anesthesia Other Findings Glaucoma  Reproductive/Obstetrics negative OB ROS                           Anesthesia Physical Anesthesia Plan  ASA: III  Anesthesia Plan: General   Post-op Pain Management:    Induction: Intravenous  PONV Risk Score and Plan: 2 and Treatment may vary due to age or medical condition, Ondansetron and Dexamethasone  Airway Management Planned: Oral ETT  Additional Equipment: None  Intra-op Plan:   Post-operative Plan: Extubation in OR  Informed Consent: I have reviewed  the patients History and Physical, chart, labs and discussed the procedure including the risks, benefits and alternatives for the proposed anesthesia with the patient or authorized representative who has indicated his/her understanding and acceptance.     Dental advisory given  Plan Discussed with: CRNA  Anesthesia Plan Comments: (See APP note by Durel Salts, FNP  Lab Results      Component                Value               Date                      WBC                      11.5 (H)            04/07/2020                HGB                      14.4                04/07/2020                HCT                      44.7                04/07/2020  MCV                      91.6                04/07/2020                PLT                      224                 04/07/2020           Lab Results      Component                Value               Date                      NA                       138                 04/07/2020                K                        5.3 (H)             04/07/2020                CO2                      25                  04/07/2020                GLUCOSE                  100 (H)             04/07/2020                BUN                      12                  04/07/2020                CREATININE               1.02                04/07/2020                CALCIUM                  9.7                 04/07/2020                GFRNONAA                 >60                 04/07/2020                GFRAA                    >  60                 11/15/2015          )      Anesthesia Quick Evaluation

## 2020-04-15 NOTE — Progress Notes (Signed)
Anesthesia Chart Review:   Case: [id number] Date/Time: 04/18/20 1300   Procedures:      VIDEO BRONCHOSCOPY WITH ENDOBRONCHIAL NAVIGATION (Bilateral )     POSSIBLE VIDEO BRONCHOSCOPY WITH ENDOBRONCHIAL ULTRASOUND (Bilateral )     THORACENTESIS (Left )   Anesthesia type: General   Diagnosis: Lung mass [R91.8]   Pre-op diagnosis: Lung mass   Location: [facility] ROOM [facility] / [facility]   Surgeons: [name], DO      DISCUSSION: Pt is a 75 year old with hx CAD (stent 2005), AAA and B common iliac aneurysms (s/p EVAR 2017), HTN  VS: Ht 5\' 7"  (1.702 m)   Wt 70.3 kg   BMI 24.28 kg/m   PROVIDERS: - PCP is [name], MD -Cardiologist is Fanny Bien, MD. Last office visit 11/24/19 (notes in care everywhere). 1 year f/u recommended   LABS:Will be obtained day of surgery    IMAGES: CT chest 03/31/20:  1. 4.3 x 3.1 cm spiculated mass is noted in right upper lobe consistent with malignancy. 4.4 x 3.9 cm lobulated mass is noted in left upper lobe consistent with malignancy. Loculated left pleural effusion is noted. PET scan is recommended for further evaluation.  2. Possible 4.3 x 3.3 cm heterogeneous mass is noted in the porta hepatis region concerning for malignancy. 3. Coronary artery calcifications are noted suggesting coronary artery disease. 4. Aortic atherosclerosis.   EKG: Will be obtained day of surgery    CV:  [place] EVAR duplex 04/15/20:  Patent endovascular aneurysm repair with no evidence of endoleak.   no cardiac testing within last 10 years   Past Medical History:  Diagnosis Date  . AAA (abdominal aortic aneurysm) ([place])   . CAD (coronary artery disease)   . Cancer ([place])    basal cell carcimona right ear and upper left at shoulder  . Glaucoma   . Hyperlipidemia   . Hypertension   . Myocardial infarction ([place]) 03/2003  . Wears glasses     Past Surgical History:  Procedure Laterality Date  . ABDOMINAL AORTIC ENDOVASCULAR STENT GRAFT N/A 11/14/2015    Procedure: ABDOMINAL AORTIC ENDOVASCULAR STENT GRAFT;  Surgeon: Elam Dutch, MD;  Location: [facility];  Service: Vascular;  Laterality: N/A;  . APPENDECTOMY    . CHOLECYSTECTOMY  2007   Gall Bladder  . COLONOSCOPY W/ BIOPSIES AND POLYPECTOMY    . EYE SURGERY Bilateral    laser for glaucoma both eyesn x 2  . heart stent  Feb. 14, 2005    MEDICATIONS: No current facility-administered medications for this encounter.   Marland Kitchen aspirin EC 81 MG tablet  . brimonidine (ALPHAGAN) 0.2 % ophthalmic solution  . dorzolamide (TRUSOPT) 2 % ophthalmic solution  . latanoprost (XALATAN) 0.005 % ophthalmic solution  . metoprolol succinate (TOPROL-XL) 25 MG 24 hr tablet  . ramipril (ALTACE) 5 MG tablet  . rosuvastatin (CRESTOR) 10 MG tablet  . timolol (TIMOPTIC) 0.5 % ophthalmic solution    If labs and EKG acceptable day of surgery, I anticipate pt can proceed with surgery as scheduled.  Willeen Cass, PhD, FNP-BC [facility]/Anesthesiology Phone: [phone] 04/15/2020 10:10 AM

## 2020-04-16 ENCOUNTER — Ambulatory Visit (HOSPITAL_COMMUNITY)
Admission: RE | Admit: 2020-04-16 | Discharge: 2020-04-16 | Disposition: A | Discharge status: Home / Self Care | Payer: Medicare Other | Source: Ambulatory Visit | Attending: Physician Assistant | Admitting: Physician Assistant

## 2020-04-16 ENCOUNTER — Other Ambulatory Visit (HOSPITAL_COMMUNITY)
Admission: RE | Admit: 2020-04-16 | Discharge: 2020-04-16 | Disposition: A | Discharge status: Home / Self Care | Payer: Medicare Other | Source: Ambulatory Visit | Attending: Pulmonary Disease | Admitting: Pulmonary Disease

## 2020-04-16 DIAGNOSIS — Z20822 Contact with and (suspected) exposure to covid-19: Secondary | ICD-10-CM | POA: Diagnosis not present

## 2020-04-16 DIAGNOSIS — R0989 Other specified symptoms and signs involving the circulatory and respiratory systems: Secondary | ICD-10-CM | POA: Diagnosis not present

## 2020-04-16 DIAGNOSIS — R918 Other nonspecific abnormal finding of lung field: Secondary | ICD-10-CM

## 2020-04-16 DIAGNOSIS — Z95828 Presence of other vascular implants and grafts: Secondary | ICD-10-CM | POA: Diagnosis not present

## 2020-04-16 DIAGNOSIS — C3490 Malignant neoplasm of unspecified part of unspecified bronchus or lung: Secondary | ICD-10-CM | POA: Diagnosis not present

## 2020-04-16 DIAGNOSIS — G9389 Other specified disorders of brain: Secondary | ICD-10-CM | POA: Diagnosis not present

## 2020-04-16 LAB — SARS CORONAVIRUS 2 (TAT 6-24 HRS): SARS Coronavirus 2: NEGATIVE

## 2020-04-16 MED ORDER — GADOBUTROL 1 MMOL/ML IV SOLN
7.0000 mL | Freq: Once | INTRAVENOUS | Status: AC | PRN
  Administered 2020-04-16: 7 mL via INTRAVENOUS

## 2020-04-18 ENCOUNTER — Ambulatory Visit (HOSPITAL_COMMUNITY): Payer: Medicare Other | Admitting: Emergency Medicine

## 2020-04-18 ENCOUNTER — Ambulatory Visit (HOSPITAL_COMMUNITY)
Admission: RE | Admit: 2020-04-18 | Discharge: 2020-04-18 | Disposition: A | Discharge status: Home / Self Care | Payer: Medicare Other | Attending: Pulmonary Disease | Admitting: Pulmonary Disease

## 2020-04-18 ENCOUNTER — Other Ambulatory Visit: Payer: Self-pay

## 2020-04-18 ENCOUNTER — Ambulatory Visit (HOSPITAL_COMMUNITY): Payer: Medicare Other

## 2020-04-18 ENCOUNTER — Encounter (HOSPITAL_COMMUNITY): Payer: Self-pay | Admitting: Pulmonary Disease

## 2020-04-18 ENCOUNTER — Encounter (HOSPITAL_COMMUNITY)
Admission: RE | Disposition: A | Discharge status: Home / Self Care | Payer: Self-pay | Source: Home / Self Care | Attending: Pulmonary Disease

## 2020-04-18 DIAGNOSIS — R918 Other nonspecific abnormal finding of lung field: Secondary | ICD-10-CM | POA: Diagnosis not present

## 2020-04-18 DIAGNOSIS — Z79899 Other long term (current) drug therapy: Secondary | ICD-10-CM | POA: Diagnosis not present

## 2020-04-18 DIAGNOSIS — Z87891 Personal history of nicotine dependence: Secondary | ICD-10-CM | POA: Insufficient documentation

## 2020-04-18 DIAGNOSIS — I714 Abdominal aortic aneurysm, without rupture: Secondary | ICD-10-CM | POA: Insufficient documentation

## 2020-04-18 DIAGNOSIS — Z955 Presence of coronary angioplasty implant and graft: Secondary | ICD-10-CM | POA: Diagnosis not present

## 2020-04-18 DIAGNOSIS — C801 Malignant (primary) neoplasm, unspecified: Secondary | ICD-10-CM | POA: Diagnosis not present

## 2020-04-18 DIAGNOSIS — C3412 Malignant neoplasm of upper lobe, left bronchus or lung: Secondary | ICD-10-CM | POA: Diagnosis not present

## 2020-04-18 DIAGNOSIS — I10 Essential (primary) hypertension: Secondary | ICD-10-CM | POA: Diagnosis not present

## 2020-04-18 DIAGNOSIS — I723 Aneurysm of iliac artery: Secondary | ICD-10-CM | POA: Insufficient documentation

## 2020-04-18 DIAGNOSIS — Z85828 Personal history of other malignant neoplasm of skin: Secondary | ICD-10-CM | POA: Diagnosis not present

## 2020-04-18 DIAGNOSIS — C3411 Malignant neoplasm of upper lobe, right bronchus or lung: Secondary | ICD-10-CM | POA: Insufficient documentation

## 2020-04-18 DIAGNOSIS — J910 Malignant pleural effusion: Secondary | ICD-10-CM | POA: Insufficient documentation

## 2020-04-18 DIAGNOSIS — I2510 Atherosclerotic heart disease of native coronary artery without angina pectoris: Secondary | ICD-10-CM | POA: Insufficient documentation

## 2020-04-18 DIAGNOSIS — Z9889 Other specified postprocedural states: Secondary | ICD-10-CM

## 2020-04-18 DIAGNOSIS — J90 Pleural effusion, not elsewhere classified: Secondary | ICD-10-CM

## 2020-04-18 DIAGNOSIS — Z7982 Long term (current) use of aspirin: Secondary | ICD-10-CM | POA: Diagnosis not present

## 2020-04-18 HISTORY — PX: BRONCHIAL BRUSHINGS: SHX5108

## 2020-04-18 HISTORY — PX: VIDEO BRONCHOSCOPY WITH ENDOBRONCHIAL NAVIGATION: SHX6175

## 2020-04-18 HISTORY — PX: BRONCHIAL BIOPSY: SHX5109

## 2020-04-18 HISTORY — PX: THORACENTESIS: SHX235

## 2020-04-18 HISTORY — PX: BRONCHIAL WASHINGS: SHX5105

## 2020-04-18 HISTORY — PX: BRONCHIAL NEEDLE ASPIRATION BIOPSY: SHX5106

## 2020-04-18 LAB — BODY FLUID CELL COUNT WITH DIFFERENTIAL
Eos, Fluid: 0 %
Lymphs, Fluid: 88 %
Monocyte-Macrophage-Serous Fluid: 8 % — ABNORMAL LOW (ref 50–90)
Neutrophil Count, Fluid: 4 % (ref 0–25)
Total Nucleated Cell Count, Fluid: 3180 cu mm — ABNORMAL HIGH (ref 0–1000)

## 2020-04-18 LAB — GLUCOSE, PLEURAL OR PERITONEAL FLUID: Glucose, Fluid: 66 mg/dL

## 2020-04-18 LAB — LACTATE DEHYDROGENASE, PLEURAL OR PERITONEAL FLUID: LD, Fluid: 441 U/L — ABNORMAL HIGH (ref 3–23)

## 2020-04-18 LAB — PROTEIN, PLEURAL OR PERITONEAL FLUID: Total protein, fluid: 3.9 g/dL

## 2020-04-18 SURGERY — VIDEO BRONCHOSCOPY WITH ENDOBRONCHIAL NAVIGATION
Anesthesia: General | Laterality: Left

## 2020-04-18 MED ORDER — PHENYLEPHRINE HCL-NACL 10-0.9 MG/250ML-% IV SOLN
INTRAVENOUS | Status: DC | PRN
  Administered 2020-04-18: 20 ug/min via INTRAVENOUS

## 2020-04-18 MED ORDER — CHLORHEXIDINE GLUCONATE 0.12 % MT SOLN
15.0000 mL | Freq: Once | OROMUCOSAL | Status: AC
  Administered 2020-04-18: 15 mL via OROMUCOSAL
  Filled 2020-04-18 (×2): qty 15

## 2020-04-18 MED ORDER — EPINEPHRINE 1 MG/10ML IJ SOSY
PREFILLED_SYRINGE | INTRAMUSCULAR | Status: AC
  Filled 2020-04-18: qty 10

## 2020-04-18 MED ORDER — FENTANYL CITRATE (PF) 250 MCG/5ML IJ SOLN
INTRAMUSCULAR | Status: AC
  Filled 2020-04-18: qty 5

## 2020-04-18 MED ORDER — ACETAMINOPHEN 500 MG PO TABS
1000.0000 mg | ORAL_TABLET | Freq: Once | ORAL | Status: AC
  Administered 2020-04-18: 1000 mg via ORAL
  Filled 2020-04-18: qty 2

## 2020-04-18 MED ORDER — DEXAMETHASONE SODIUM PHOSPHATE 10 MG/ML IJ SOLN
INTRAMUSCULAR | Status: DC | PRN
  Administered 2020-04-18: 10 mg via INTRAVENOUS

## 2020-04-18 MED ORDER — PROPOFOL 10 MG/ML IV BOLUS
INTRAVENOUS | Status: DC | PRN
  Administered 2020-04-18: 140 mg via INTRAVENOUS

## 2020-04-18 MED ORDER — LIDOCAINE 2% (20 MG/ML) 5 ML SYRINGE
INTRAMUSCULAR | Status: DC | PRN
  Administered 2020-04-18: 60 mg via INTRAVENOUS

## 2020-04-18 MED ORDER — LACTATED RINGERS IV SOLN: INTRAVENOUS | Status: DC

## 2020-04-18 MED ORDER — ROCURONIUM BROMIDE 10 MG/ML (PF) SYRINGE
PREFILLED_SYRINGE | INTRAVENOUS | Status: DC | PRN
  Administered 2020-04-18: 50 mg via INTRAVENOUS

## 2020-04-18 MED ORDER — SUGAMMADEX SODIUM 200 MG/2ML IV SOLN
INTRAVENOUS | Status: DC | PRN
  Administered 2020-04-18: 200 mg via INTRAVENOUS

## 2020-04-18 MED ORDER — PROPOFOL 10 MG/ML IV BOLUS
INTRAVENOUS | Status: AC
  Filled 2020-04-18: qty 20

## 2020-04-18 MED ORDER — FENTANYL CITRATE (PF) 250 MCG/5ML IJ SOLN
INTRAMUSCULAR | Status: DC | PRN
  Administered 2020-04-18: 100 ug via INTRAVENOUS

## 2020-04-18 MED ORDER — ONDANSETRON HCL 4 MG/2ML IJ SOLN
INTRAMUSCULAR | Status: DC | PRN
  Administered 2020-04-18: 4 mg via INTRAVENOUS

## 2020-04-18 SURGICAL SUPPLY — 53 items
ADAPTER BRONCH F/PENTAX (ADAPTER) ×4 IMPLANT
ADAPTER VALVE BIOPSY EBUS (MISCELLANEOUS) IMPLANT
ADPR BSCP EDG PNTX (ADAPTER) ×3
ADPTR VALVE BIOPSY EBUS (MISCELLANEOUS)
BRUSH CYTOL CELLEBRITY 1.5X140 (MISCELLANEOUS) ×4 IMPLANT
BRUSH SUPERTRAX BIOPSY (INSTRUMENTS) IMPLANT
BRUSH SUPERTRAX NDL-TIP CYTO (INSTRUMENTS) ×4 IMPLANT
CANISTER SUCT 3000ML PPV (MISCELLANEOUS) ×4 IMPLANT
CHANNEL WORK EXTEND EDGE 180 (KITS) IMPLANT
CHANNEL WORK EXTEND EDGE 45 (KITS) IMPLANT
CHANNEL WORK EXTEND EDGE 90 (KITS) IMPLANT
CONT SPEC 4OZ CLIKSEAL STRL BL (MISCELLANEOUS) ×4 IMPLANT
COVER BACK TABLE 60X90IN (DRAPES) ×4 IMPLANT
COVER DOME SNAP 22 D (MISCELLANEOUS) ×4 IMPLANT
FILTER STRAW FLUID ASPIR (MISCELLANEOUS) IMPLANT
FORCEPS BIOP RJ4 1.8 (CUTTING FORCEPS) IMPLANT
FORCEPS BIOP SUPERTRX PREMAR (INSTRUMENTS) ×4 IMPLANT
GAUZE SPONGE 4X4 12PLY STRL (GAUZE/BANDAGES/DRESSINGS) ×4 IMPLANT
GLOVE BIO SURGEON STRL SZ7.5 (GLOVE) ×4 IMPLANT
GLOVE SURG SS PI 7.5 STRL IVOR (GLOVE) ×8 IMPLANT
GOWN STRL REUS W/ TWL LRG LVL3 (GOWN DISPOSABLE) ×6 IMPLANT
GOWN STRL REUS W/TWL LRG LVL3 (GOWN DISPOSABLE) ×8
KIT CLEAN ENDO COMPLIANCE (KITS) ×8 IMPLANT
KIT LOCATABLE GUIDE (CANNULA) IMPLANT
KIT MARKER FIDUCIAL DELIVERY (KITS) IMPLANT
KIT PROCEDURE EDGE 180 (KITS) IMPLANT
KIT PROCEDURE EDGE 45 (KITS) IMPLANT
KIT PROCEDURE EDGE 90 (KITS) IMPLANT
KIT TURNOVER KIT B (KITS) ×4 IMPLANT
MARKER SKIN DUAL TIP RULER LAB (MISCELLANEOUS) ×4 IMPLANT
NDL EBUS SONO TIP PENTAX (NEEDLE) ×2 IMPLANT
NDL SUPERTRX PREMARK BIOPSY (NEEDLE) ×2 IMPLANT
NEEDLE EBUS SONO TIP PENTAX (NEEDLE) ×4 IMPLANT
NEEDLE SUPERTRX PREMARK BIOPSY (NEEDLE) ×4 IMPLANT
NS IRRIG 1000ML POUR BTL (IV SOLUTION) ×4 IMPLANT
OIL SILICONE PENTAX (PARTS (SERVICE/REPAIRS)) ×4 IMPLANT
PAD ARMBOARD 7.5X6 YLW CONV (MISCELLANEOUS) ×8 IMPLANT
PATCHES PATIENT (LABEL) ×12 IMPLANT
SOL ANTI FOG 6CC (MISCELLANEOUS) ×3 IMPLANT
SOLUTION ANTI FOG 6CC (MISCELLANEOUS) ×1
SYR 20CC LL (SYRINGE) ×8 IMPLANT
SYR 20ML ECCENTRIC (SYRINGE) ×8 IMPLANT
SYR 50ML SLIP (SYRINGE) ×4 IMPLANT
SYR 5ML LUER SLIP (SYRINGE) ×4 IMPLANT
TOWEL OR 17X24 6PK STRL BLUE (TOWEL DISPOSABLE) ×4 IMPLANT
TRAP SPECIMEN MUCOUS 40CC (MISCELLANEOUS) IMPLANT
TUBE CONNECTING 20X1/4 (TUBING) ×8 IMPLANT
UNDERPAD 30X30 (UNDERPADS AND DIAPERS) ×4 IMPLANT
VALVE BIOPSY  SINGLE USE (MISCELLANEOUS) ×4
VALVE BIOPSY SINGLE USE (MISCELLANEOUS) ×3 IMPLANT
VALVE DISPOSABLE (MISCELLANEOUS) ×4 IMPLANT
VALVE SUCTION BRONCHIO DISP (MISCELLANEOUS) ×4 IMPLANT
WATER STERILE IRR 1000ML POUR (IV SOLUTION) ×4 IMPLANT

## 2020-04-18 NOTE — Discharge Instructions (Signed)
Flexible Bronchoscopy, Care After This sheet gives you information about how to care for yourself after your test. Your doctor may also give you more specific instructions. If you have problems or questions, contact your doctor. Follow these instructions at home: Eating and drinking  Do not eat or drink anything (not even water) for 2 hours after your test, or until your numbing medicine (local anesthetic) wears off.  When your numbness is gone and your cough and gag reflexes have come back, you may: ? Eat only soft foods. ? Slowly drink liquids.  The day after the test, go back to your normal diet. Driving  Do not drive for 24 hours if you were given a medicine to help you relax (sedative).  Do not drive or use heavy machinery while taking prescription pain medicine. General instructions   Take over-the-counter and prescription medicines only as told by your doctor.  Return to your normal activities as told. Ask what activities are safe for you.  Do not use any products that have nicotine or tobacco in them. This includes cigarettes and e-cigarettes. If you need help quitting, ask your doctor.  Keep all follow-up visits as told by your doctor. This is important. It is very important if you had a tissue sample (biopsy) taken. Get help right away if:  You have shortness of breath that gets worse.  You get light-headed.  You feel like you are going to pass out (faint).  You have chest pain.  You cough up: ? More than a little blood. ? More blood than before. Summary  Do not eat or drink anything (not even water) for 2 hours after your test, or until your numbing medicine wears off.  Do not use cigarettes. Do not use e-cigarettes.  Get help right away if you have chest pain.   This information is not intended to replace advice given to you by your health care provider. Make sure you discuss any questions you have with your health care provider. Document Released:  11/12/2008 Document Revised: 12/28/2016 Document Reviewed: 02/03/2016 Elsevier Patient Education  2020 [employer]   General Anesthesia, Adult, Care After This sheet gives you information about how to care for yourself after your procedure. Your health care provider may also give you more specific instructions. If you have problems or questions, contact your health care provider. What can I expect after the procedure? After the procedure, the following side effects are common:  Pain or discomfort at the IV site.  Nausea.  Vomiting.  Sore throat.  Trouble concentrating.  Feeling cold or chills.  Feeling weak or tired.  Sleepiness and fatigue.  Soreness and body aches. These side effects can affect parts of the body that were not involved in surgery. Follow these instructions at home: For the time period you were told by your health care provider:  Rest.  Do not participate in activities where you could fall or become injured.  Do not drive or use machinery.  Do not drink alcohol.  Do not take sleeping pills or medicines that cause drowsiness.  Do not make important decisions or sign legal documents.  Do not take care of children on your own.   Eating and drinking  Follow any instructions from your health care provider about eating or drinking restrictions.  When you feel hungry, start by eating small amounts of foods that are soft and easy to digest (bland), such as toast. Gradually return to your regular diet.  Drink enough fluid to keep   your urine pale yellow.  If you vomit, rehydrate by drinking water, juice, or clear broth. General instructions  If you have sleep apnea, surgery and certain medicines can increase your risk for breathing problems. Follow instructions from your health care provider about wearing your sleep device: ? Anytime you are sleeping, including during daytime naps. ? While taking prescription pain medicines, sleeping medicines, or  medicines that make you drowsy.  Have a responsible adult stay with you for the time you are told. It is important to have someone help care for you until you are awake and alert.  Return to your normal activities as told by your health care provider. Ask your health care provider what activities are safe for you.  Take over-the-counter and prescription medicines only as told by your health care provider.  If you smoke, do not smoke without supervision.  Keep all follow-up visits as told by your health care provider. This is important. Contact a health care provider if:  You have nausea or vomiting that does not get better with medicine.  You cannot eat or drink without vomiting.  You have pain that does not get better with medicine.  You are unable to pass urine.  You develop a skin rash.  You have a fever.  You have redness around your IV site that gets worse. Get help right away if:  You have difficulty breathing.  You have chest pain.  You have blood in your urine or stool, or you vomit blood. Summary  After the procedure, it is common to have a sore throat or nausea. It is also common to feel tired.  Have a responsible adult stay with you for the time you are told. It is important to have someone help care for you until you are awake and alert.  When you feel hungry, start by eating small amounts of foods that are soft and easy to digest (bland), such as toast. Gradually return to your regular diet.  Drink enough fluid to keep your urine pale yellow.  Return to your normal activities as told by your health care provider. Ask your health care provider what activities are safe for you. This information is not intended to replace advice given to you by your health care provider. Make sure you discuss any questions you have with your health care provider. Document Revised: 10/01/2019 Document Reviewed: 04/30/2019 Elsevier Patient Education  2021 [employer].

## 2020-04-18 NOTE — Anesthesia Procedure Notes (Signed)
Procedure Name: Intubation Date/Time: 04/18/2020 1:04 PM Performed by: Thelma Comp, CRNA Pre-anesthesia Checklist: Patient identified, Emergency Drugs available, Suction available and Patient being monitored Patient Re-evaluated:Patient Re-evaluated prior to induction Oxygen Delivery Method: Circle System Utilized Preoxygenation: Pre-oxygenation with 100% oxygen Induction Type: IV induction Ventilation: Mask ventilation without difficulty Laryngoscope Size: Mac and 4 Grade View: Grade I Tube type: Oral Tube size: 8.5 mm Number of attempts: 1 Airway Equipment and Method: Stylet Placement Confirmation: ETT inserted through vocal cords under direct vision,  positive ETCO2 and breath sounds checked- equal and bilateral Secured at: 23 cm Tube secured with: Tape Dental Injury: Teeth and Oropharynx as per pre-operative assessment

## 2020-04-18 NOTE — Op Note (Addendum)
Thoracentesis  Procedure Note  Riley Sharp  [id number]  Aug 06, 1945  Date:04/18/20  Time:2:37 PM   Provider Performing:Dannel Rafter L Qaadir Kent   Procedure: Thoracentesis with imaging guidance (40768)  Indication(s) Pleural Effusion  Consent Risks of the procedure as well as the alternatives and risks of each were explained to the patient and/or caregiver.  Consent for the procedure was obtained and is signed in the bedside chart  Anesthesia Topical only with 1% lidocaine   Time Out Verified patient identification, verified procedure, site/side was marked, verified correct patient position, special equipment/implants available, medications/allergies/relevant history reviewed, required imaging and test results available.  Sterile Technique Maximal sterile technique including full sterile barrier drape, hand hygiene, sterile gown, sterile gloves, mask, hair covering, sterile ultrasound probe cover (if used).  Procedure Description Ultrasound was used to identify appropriate pleural anatomy for placement and overlying skin marked.  Area of drainage cleaned and draped in sterile fashion. Lidocaine was used to anesthetize the skin and subcutaneous tissue.  1250 cc's of serosanginous appearing fluid was drained from the left pleural space. Catheter then removed and bandaid applied to site.  Complications/Tolerance None; patient tolerated the procedure well. Chest X-ray is ordered to confirm no post-procedural complication.  EBL Minimal  Specimen(s) Pleural fluid   Riley Nash, DO [facility] 04/18/2020 2:38 PM      Left pleural effusion On bedside Ultrasound

## 2020-04-18 NOTE — Addendum Note (Signed)
Addendum  created 04/18/20 1659 by Pervis Hocking, DO   Intraprocedure Event edited, Intraprocedure Staff edited

## 2020-04-18 NOTE — Transfer of Care (Signed)
Immediate Anesthesia Transfer of Care Note  Patient: Riley Sharp  Procedure(s) Performed: VIDEO BRONCHOSCOPY WITH ENDOBRONCHIAL NAVIGATION (Bilateral ) THORACENTESIS (Left ) BRONCHIAL BRUSHINGS BRONCHIAL NEEDLE ASPIRATION BIOPSIES BRONCHIAL BIOPSIES BRONCHIAL WASHINGS  Patient Location: Endoscopy Unit  Anesthesia Type:General  Level of Consciousness: drowsy and patient cooperative  Airway & Oxygen Therapy: Patient Spontanous Breathing and Patient connected to face mask oxygen  Post-op Assessment: Report given to RN and Post -op Vital signs reviewed and stable  Post vital signs: Reviewed and stable  Last Vitals:  Vitals Value Taken Time  BP 159/76 04/18/20 1441  Temp    Pulse 64 04/18/20 1443  Resp 15 04/18/20 1443  SpO2 100 % 04/18/20 1443  Vitals shown include unvalidated device data.  Last Pain:  Vitals:   04/18/20 1149  TempSrc:   PainSc: 0-No pain      Patients Stated Pain Goal: 2 (99/35/70 1779)  Complications: No complications documented.

## 2020-04-18 NOTE — Progress Notes (Signed)
[facility] OFFICE PROGRESS NOTE  [name], MD [address] [place] [address]  DIAGNOSIS:  Suspicious lung cancer, pathology pending. He presented with a right upper lobe lung mass, a left upper lobe lung mass, and a loculated left pleural effusion. He also had mild hypermetabolism in the right hilar and para mediastinal lymphadenopathy and a few small matastatic bone lesions in C2, L5, right ribs, and right iliac creast. He was diagnosed in March 2022.   PRIOR THERAPY: None  CURRENT THERAPY: None  INTERVAL HISTORY: Riley Sharp 75 y.o. male returns to clinic today for a follow-up visit accompanied by his son.  The patient was first seen in clinic on 04/07/2020. He was found to have suspicious multifocal masses in the RUL, LUL, and a possible mass in the porta hepatis.  In the interval since his last appointment, the patient has been completing the work-up process.  He had a brain MRI performed.  He also met with radiation oncology for consideration of palliative radiotherapy to the lung for the hemoptysis.  Due to the improvement in the hemoptysis, radiation oncology did not feel that there was an urgent role for radiation treatment at this time. He reports a few specks of blood when he coughs presently. He also met with Dr. Valeta Harms, from pulmonary medicine, who performed a bronchoscopy on 04/18/2020 and a thoracentesis. The patient reported that 1.5 L of fluid was yielded.  He noted some slight improvement in the chest pressure with the thoracentesis. The patient had a PET scan on 04/20/20.   Overall, he is feeling similar. His main concern is related to the occasional left sided chest tightness, sharp pain. This radiates to his back near his L shoulder blade, especially with sneezing/coughing. He has not tried taking anything for cough. He took a few tylenol this week for a headache but was unsure if that helped his chest discomfort. He denies any fever, chills,  or night sweats.  He reports that he lost approximately 10 pounds since early January 2022 but none since his last appointment.  He denies any nausea, vomiting, diarrhea, or constipation. He did mention he has been belching more often. He denies any gait changes, slurred speech, or extremity weakness. He is here for evaluation and to review his results and for a more detailed discussion about his current condition and recommended treatment options.    MEDICAL HISTORY: Past Medical History:  Diagnosis Date  . AAA (abdominal aortic aneurysm) ([place])   . CAD (coronary artery disease)   . Cancer ([place])    basal cell carcimona right ear and upper left at shoulder  . Glaucoma   . Hyperlipidemia   . Hypertension   . Myocardial infarction ([place]) 03/2003  . Wears glasses     ALLERGIES:  has No Known Allergies.  MEDICATIONS:  Current Outpatient Medications  Medication Sig Dispense Refill  . aspirin EC 81 MG tablet Take 81 mg by mouth daily.    . brimonidine (ALPHAGAN) 0.2 % ophthalmic solution Place 1 drop into both eyes 3 (three) times daily.    . dorzolamide (TRUSOPT) 2 % ophthalmic solution Place 1 drop into both eyes 3 (three) times daily.    Marland Kitchen latanoprost (XALATAN) 0.005 % ophthalmic solution Place 1 drop into both eyes at bedtime.    . metoprolol succinate (TOPROL-XL) 25 MG 24 hr tablet Take 25 mg by mouth daily.    . ramipril (ALTACE) 5 MG tablet Take 5 mg by mouth  daily.    . rosuvastatin (CRESTOR) 10 MG tablet Take 10 mg by mouth daily.    . timolol (TIMOPTIC) 0.5 % ophthalmic solution Place 1 drop into both eyes daily.     No current facility-administered medications for this visit.    SURGICAL HISTORY:  Past Surgical History:  Procedure Laterality Date  . ABDOMINAL AORTIC ENDOVASCULAR STENT GRAFT N/A 11/14/2015   Procedure: ABDOMINAL AORTIC ENDOVASCULAR STENT GRAFT;  Surgeon: Elam Dutch, MD;  Location: [facility];  Service: Vascular;  Laterality: N/A;  . APPENDECTOMY    .  BRONCHIAL BIOPSY  04/18/2020   Procedure: BRONCHIAL BIOPSIES;  Surgeon: [name], DO;  Location: [facility];  Service: Pulmonary;;  . BRONCHIAL BRUSHINGS  04/18/2020   Procedure: BRONCHIAL BRUSHINGS;  Surgeon: [name], DO;  Location: [facility];  Service: Pulmonary;;  . BRONCHIAL NEEDLE ASPIRATION BIOPSY  04/18/2020   Procedure: BRONCHIAL NEEDLE ASPIRATION BIOPSIES;  Surgeon: [name], DO;  Location: [facility];  Service: Pulmonary;;  . BRONCHIAL WASHINGS  04/18/2020   Procedure: BRONCHIAL WASHINGS;  Surgeon: [name], DO;  Location: [facility];  Service: Pulmonary;;  . CHOLECYSTECTOMY  2007   Gall Bladder  . COLONOSCOPY W/ BIOPSIES AND POLYPECTOMY    . EYE SURGERY Bilateral    laser for glaucoma both eyesn x 2  . heart stent  Feb. 14, 2005  . THORACENTESIS Left 04/18/2020   Procedure: THORACENTESIS;  Surgeon: [name], DO;  Location: [facility];  Service: Pulmonary;  Laterality: Left;  Marland Kitchen VIDEO BRONCHOSCOPY WITH ENDOBRONCHIAL NAVIGATION Bilateral 04/18/2020   Procedure: VIDEO BRONCHOSCOPY WITH ENDOBRONCHIAL NAVIGATION;  Surgeon: [name], DO;  Location: [facility];  Service: Pulmonary;  Laterality: Bilateral;    REVIEW OF SYSTEMS:   Review of Systems  Constitutional:  Negative for appetite change, weight loss, chills, fatigue, and fever. HENT: Negative for mouth sores, nosebleeds, sore throat and trouble swallowing.   Eyes: Negative for eye problems and icterus.  Respiratory: Positive for cough, blood tinged sputum, and dyspnea on exertion.   Cardiovascular: Positive for left Negative for chest pain and leg swelling.  Gastrointestinal: Occasional left upper quadrant discomfort.  Negative for  constipation, diarrhea, nausea and vomiting.  Genitourinary: Negative for bladder incontinence, difficulty urinating, dysuria, frequency and hematuria.   Musculoskeletal: Negative for back pain, gait problem, neck pain and neck stiffness.   Skin: Negative for itching and rash.  Neurological: Negative for dizziness, extremity weakness, gait problem, headaches, light-headedness and seizures.  Hematological: Negative for adenopathy. Does not bruise/bleed easily.  Psychiatric/Behavioral: Negative for confusion, depression and sleep disturbance. The patient is not nervous/anxious.    PHYSICAL EXAMINATION:  Blood pressure 128/69, pulse 91, temperature 98.9 F (37.2 C), temperature source Tympanic, resp. rate 14, height $RemoveBe'5\' 7"'BLPlLEQth$  (1.702 m), weight 157 lb 1.6 oz (71.3 kg), SpO2 99 %.  ECOG PERFORMANCE STATUS: 1 - Symptomatic but completely ambulatory  Physical Exam  Constitutional: Oriented to person, place, and time and well-developed, well-nourished, and in no distress.  HENT:  Head: Normocephalic and atraumatic.  Mouth/Throat: Oropharynx is clear and moist. No oropharyngeal exudate.  Eyes: Conjunctivae are normal. Right eye exhibits no discharge. Left eye exhibits no discharge. No scleral icterus.  Neck: Normal range of motion. Neck supple.  Cardiovascular: Normal rate, regular rhythm, normal heart sounds and intact distal pulses.   Pulmonary/Chest: Effort normal and breath sounds normal. No respiratory distress. No wheezes. No rales.  Abdominal: Soft. Bowel sounds are normal. Exhibits no distension and no mass.  There is no tenderness.  Musculoskeletal: Normal range of motion. Exhibits no edema.  Lymphadenopathy:    No cervical adenopathy.  Neurological: Alert and oriented to person, place, and time. Exhibits normal muscle tone. Gait normal. Coordination normal.  Skin: Skin is warm and dry. No rash noted. Not diaphoretic. No erythema. No pallor.  Psychiatric: Mood, memory and judgment normal.  Vitals reviewed.  LABORATORY DATA: Lab Results  Component Value Date   WBC 11.5 (H) 04/07/2020   HGB 14.4 04/07/2020   HCT 44.7 04/07/2020   MCV 91.6 04/07/2020   PLT 224 04/07/2020      Chemistry      Component Value Date/Time    NA 138 04/07/2020 0925   K 5.3 (H) 04/07/2020 0925   CL 105 04/07/2020 0925   CO2 25 04/07/2020 0925   BUN 12 04/07/2020 0925   CREATININE 1.02 04/07/2020 0925      Component Value Date/Time   CALCIUM 9.7 04/07/2020 0925   ALKPHOS 84 04/07/2020 0925   AST 12 (L) 04/07/2020 0925   ALT 13 04/07/2020 0925   BILITOT 0.4 04/07/2020 0925       RADIOGRAPHIC STUDIES:  DG Chest 2 View  Result Date: 03/31/2020 CLINICAL DATA:  75 year old male with history of hemoptysis and cough for the past 2 months. EXAM: CHEST - 2 VIEW COMPARISON:  Chest x-ray 11/14/2015. FINDINGS: New multifocal nodular and masslike opacities in the lungs bilaterally (left greater than right) with what appears to be a partially loculated left pleural effusion. No evidence of pulmonary edema. No pneumothorax. Heart size is normal. Prominence of left hilar structures. IMPRESSION: 1. Findings are highly concerning for probable metastatic disease in the chest, as detailed above. Further evaluation with contrast enhanced chest CT is strongly recommended at this time to better evaluate these findings. These results will be called to the ordering clinician or representative by the Radiologist Assistant, and communication documented in the PACS or [employer]. Electronically Signed   By: Vinnie Langton M.D.   On: 03/31/2020 09:16   CT CHEST W CONTRAST  Result Date: 04/01/2020 CLINICAL DATA:  Hemoptysis.  Abnormal chest x-ray. EXAM: CT CHEST WITH CONTRAST TECHNIQUE: Multidetector CT imaging of the chest was performed during intravenous contrast administration. CONTRAST:  69mL ISOVUE-300 IOPAMIDOL (ISOVUE-300) INJECTION 61% COMPARISON:  March 30, 2020. FINDINGS: Cardiovascular: Atherosclerosis of thoracic aorta is noted without aneurysm or dissection. Normal cardiac size. No pericardial effusion. Coronary artery calcifications are noted. Mediastinum/Nodes: No enlarged mediastinal, hilar, or axillary lymph nodes. Thyroid gland,  trachea, and esophagus demonstrate no significant findings. Lungs/Pleura: No pneumothorax is noted. 4.3 x 3.1 cm spiculated mass is noted in right upper lobe consistent with malignancy. 4.4 x 3.9 cm lobulated mass is noted in left upper lobe consistent with malignancy. Loculated left pleural effusion is noted. Upper Abdomen: Possible 4.3 x 3.3 cm heterogeneous mass is noted in the porta hepatis region concerning for malignancy. Musculoskeletal: No chest wall abnormality. No acute or significant osseous findings. IMPRESSION: 1. 4.3 x 3.1 cm spiculated mass is noted in right upper lobe consistent with malignancy. 4.4 x 3.9 cm lobulated mass is noted in left upper lobe consistent with malignancy. Loculated left pleural effusion is noted. PET scan is recommended for further evaluation. 2. Possible 4.3 x 3.3 cm heterogeneous mass is noted in the porta hepatis region concerning for malignancy. 3. Coronary artery calcifications are noted suggesting coronary artery disease. 4. Aortic atherosclerosis. Aortic Atherosclerosis (ICD10-I70.0). These results will be called to the ordering clinician or representative  by the Radiologist Assistant, and communication documented in the PACS or [employer]. Electronically Signed   By: Marijo Conception M.D.   On: 04/01/2020 08:33   MR Brain W Wo Contrast  Result Date: 04/18/2020 CLINICAL DATA:  Lung cancer, follow-up EXAM: MRI HEAD WITHOUT AND WITH CONTRAST TECHNIQUE: Multiplanar, multiecho pulse sequences of the brain and surrounding structures were obtained without and with intravenous contrast. CONTRAST:  42mL GADAVIST GADOBUTROL 1 MMOL/ML IV SOLN COMPARISON:  None. FINDINGS: Brain: There are two punctate foci of cortical diffusion hyperintensity in the left parieto-occipital region. No evidence of intracranial hemorrhage. There is no intracranial mass, mass effect, or edema. There is no hydrocephalus or extra-axial fluid collection. Prominence of the ventricles and sulci  reflects minor generalized parenchymal volume loss. Minimal patchy T2 hyperintensity in the supratentorial white matter is nonspecific may reflect minor chronic microvascular ischemic changes. No abnormal enhancement. Vascular: Major vessel flow voids at the skull base are preserved. Skull and upper cervical spine: Normal marrow signal is preserved. Sinuses/Orbits: Paranasal sinuses are aerated. Orbits are unremarkable. Other: Sella is unremarkable. Mastoid air cells are clear. Small retention cysts along the posterior nasopharyngeal wall. IMPRESSION: Two punctate likely subacute left parietooccipital infarcts. No evidence of intracranial metastatic disease. Electronically Signed   By: Macy Mis M.D.   On: 04/18/2020 08:50   NM PET Image Initial (PI) Skull Base To Thigh  Result Date: 04/20/2020 CLINICAL DATA:  Initial treatment strategy for bilateral upper lobe lung masses. EXAM: NUCLEAR MEDICINE PET SKULL BASE TO THIGH TECHNIQUE: 8.0 mCi F-18 FDG was injected intravenously. Full-ring PET imaging was performed from the skull base to thigh after the radiotracer. CT data was obtained and used for attenuation correction and anatomic localization. Fasting blood glucose: 90 mg/dl COMPARISON:  Chest CT on 03/31/2020 FINDINGS: Mediastinal blood-pool activity (background): SUV max = 2.6 Liver activity (reference): SUV max = N/A NECK:  No hypermetabolic lymph nodes or masses. Incidental CT findings:  None. CHEST: Spiculated mass in the right upper lobe measuring 3.9 cm is hypermetabolic, with SUV max of 11.9. Similar mass in the left upper lobe measures 4.5 cm has also hypermetabolic, with SUV max of 10.5. Nodular hypermetabolic activity is also seen throughout the left pleural space with small pleural effusion, consistent with malignant pleural effusion. Mild lymphadenopathy is seen in the right hilum and right paratracheal region, with SUV max 9.9 and 5.7 respectively. Incidental CT findings:  None.  ABDOMEN/PELVIS: No abnormal hypermetabolic activity within the liver, pancreas, adrenal glands, or spleen. No hypermetabolic lymph nodes in the abdomen or pelvis. Incidental CT findings: Prior aortic stent graft repair abdominal aortic aneurysm, with stable size of native aneurysm sac. Diverticulosis is seen mainly involving the descending and sigmoid colon, however there is no evidence of diverticulitis. SKELETON: Several small hypermetabolic bone metastases are seen in the C2 vertebral body, right ribs, L5 vertebral body, and right iliac crest. Incidental CT findings:  None. IMPRESSION: Hypermetabolic bilateral upper lobe pulmonary masses, consistent with synchronous bronchogenic carcinomas. Mild hypermetabolic right hilar and paramediastinal lymphadenopathy, consistent with lymph node metastases. Small left pleural effusion with diffuse hypermetabolic activity, consistent with metastatic disease. Hypermetabolic bone metastases in the cervical and lumbar spine, right ribs, and right ilium. Electronically Signed   By: Marlaine Hind M.D.   On: 04/20/2020 15:53   DG CHEST PORT 1 VIEW  Result Date: 04/18/2020 CLINICAL DATA:  Status post bronchoscopy EXAM: PORTABLE CHEST 1 VIEW COMPARISON:  03/30/2020 chest radiograph and CT of 03/31/2020. FINDINGS: Patient rotated  left. Normal heart size. Small left pleural effusion with loculation laterally, similar. No right-sided pleural fluid. No pneumothorax. Worsened aeration, with left greater than right airspace disease superimposed upon bilateral upper lobe lung masses. IMPRESSION: No pneumothorax or other acute complication after bronchoscopy. Similar loculated left-sided pleural effusion. Bilateral upper lobe lung masses. Worsened aeration with left greater than right superimposed airspace disease. Consider infection. Electronically Signed   By: Abigail Miyamoto M.D.   On: 04/18/2020 15:15   VAS [place] ABI WITH/WO TBI  Result Date: 04/15/2020 LOWER EXTREMITY DOPPLER STUDY  Indications: Peripheral artery disease. Other Factors: Occasional left hip and buttock pain with walking.  Vascular Interventions: EVAR 11/14/15. Comparison Study: No previous ABI. EVAR exam on 04/09/19 suggested stenosis at                   the distal left limb. Performing Technologist: Ralene Cork RVT  Examination Guidelines: A complete evaluation includes at minimum, Doppler waveform signals and systolic blood pressure reading at the level of bilateral brachial, anterior tibial, and posterior tibial arteries, when vessel segments are accessible. Bilateral testing is considered an integral part of a complete examination. Photoelectric Plethysmograph (PPG) waveforms and toe systolic pressure readings are included as required and additional duplex testing as needed. Limited examinations for reoccurring indications may be performed as noted.  ABI Findings: +---------+------------------+-----+--------+--------+ Right    Rt Pressure (mmHg)IndexWaveformComment  +---------+------------------+-----+--------+--------+ Brachial 120                                     +---------+------------------+-----+--------+--------+ PTA      132               1.10 biphasic         +---------+------------------+-----+--------+--------+ DP       0                 0.00 absent           +---------+------------------+-----+--------+--------+ Great Toe74                0.62                  +---------+------------------+-----+--------+--------+ +---------+------------------+-----+--------+-------+ Left     Lt Pressure (mmHg)IndexWaveformComment +---------+------------------+-----+--------+-------+ Brachial 120                                    +---------+------------------+-----+--------+-------+ PTA      130               1.08 biphasic        +---------+------------------+-----+--------+-------+ DP       0                 0.00 absent           +---------+------------------+-----+--------+-------+ Great Toe77                0.64                 +---------+------------------+-----+--------+-------+ +-------+-----------+-----------+------------+------------+ ABI/TBIToday's ABIToday's TBIPrevious ABIPrevious TBI +-------+-----------+-----------+------------+------------+ Right  1.1        0.62                                +-------+-----------+-----------+------------+------------+ Left   1.08       0.64                                +-------+-----------+-----------+------------+------------+  No previous ABI.  Summary: Right: Resting right ankle-brachial index is within normal range. No evidence of significant right lower extremity arterial disease. The right toe-brachial index is abnormal. RT great toe pressure = 74 mmHg. Left: Resting left ankle-brachial index is within normal range. No evidence of significant left lower extremity arterial disease. The left toe-brachial index is abnormal. LT Great toe pressure = 77 mmHg.  *See table(s) above for measurements and observations.  Electronically signed by Lemar Livings MD on 04/15/2020 at 9:59:04 AM.    Final    VAS [place] EVAR DUPLEX  Result Date: 04/15/2020 ABDOMINAL AORTA STUDY Indications: Follow up exam for EVAR. Surgery date EVAR 11/14/15.  Comparison Study: 04/09/19: 4.81 x 5.27 cm Patent endovascular aneurysm repair                   with no evidence of endoleak. Performing Technologist: Thereasa Parkin RVT  Examination Guidelines: A complete evaluation includes B-mode imaging, spectral Doppler, color Doppler, and power Doppler as needed of all accessible portions of each vessel. Bilateral testing is considered an integral part of a complete examination. Limited examinations for reoccurring indications may be performed as noted.  Endovascular Aortic Repair (EVAR): +----------+----------------+-------------------+-------------------+           Diameter AP (cm)Diameter Trans  (cm)Velocities (cm/sec) +----------+----------------+-------------------+-------------------+ Aorta     4.73            5.26               24                  +----------+----------------+-------------------+-------------------+ Right Limb1.65            1.63               33                  +----------+----------------+-------------------+-------------------+ Left Limb 1.90            1.71               33                  +----------+----------------+-------------------+-------------------+  Summary: Abdominal Aorta: Patent endovascular aneurysm repair with no evidence of endoleak.  *See table(s) above for measurements and observations.  Electronically signed by Lemar Livings MD on 04/15/2020 at 10:01:01 AM.   Final    DG C-ARM BRONCHOSCOPY  Result Date: 04/18/2020 C-ARM BRONCHOSCOPY: Fluoroscopy was utilized by the requesting physician.  No radiographic interpretation.     ASSESSMENT/PLAN:  This is a very pleasant 75 year old Caucasian male diagnosed with suspicious stage IV lung cancer, pending final pathology. He presented with a right upper lobe lung mass, a left upper lobe lung mass, and a loculated left pleural effusion. He also had mild hypermetabolism in the right hilar and para mediastinal lymphadenopathy and a few small matastatic bone lesions in C2, L5, right ribs, and right iliac creast.  He was diagnosed in March 2022.   The patient was seen with Dr. Arbutus Ped today. Dr. Arbutus Ped had a lengthy discussion with the patient about his current condition and recommended treatment options.  Dr. Arbutus Ped reviewed his brain MRI and PET scan in detail.  Dr. Arbutus Ped discussed that his condition is treatable but not curative.  The patient's treatment will likely involve chemotherapy but the final treatment choice will depend on the final pathology which is still pending at this time.   We were hopeful that the final pathology will  be available tomorrow.  Dr. Julien Nordmann discussed the  prognosis for small cell lung cancer versus non-small cell lung cancer.  The final pathology is consistent with non-small cell lung cancer, I will request for PD-L1 testing. If the final pathology is found to be consistent with non-small cell lung cancer, adenocarcinoma I will call the patient and arrange for molecular testing by guardant 360 tomorrow which is a blood test.  Once we have the final pathology, we will arrange for the patient to have a follow-up visit to have a more detailed discussion about his current condition and recommended treatment options.  In the meantime, I will reach out to radiation oncology to see if the patient requires any radiotherapy to the small bone lesion on C2, given the location.  The patient appears to be asymptomatic from the other bone lesions at this time.  The patient is going to take Tylenol as needed for the left shoulder and chest discomfort from the tumor.  The patient does not feel that he needs any prescription pain medication at this time.   The patient was advised to call immediately if she has any concerning symptoms in the interval. The patient voices understanding of current disease status and treatment options and is in agreement with the current care plan. All questions were answered. The patient knows to call the clinic with any problems, questions or concerns. We can certainly see the patient much sooner if necessary     No orders of the defined types were placed in this encounter.    Jayln Madeira L Aydon Swamy, PA-C 04/21/20  ADDENDUM: Hematology/Oncology Attending: I had a face-to-face encounter with the patient today.  I reviewed his record, scans and recommended his care plan. This is a very pleasant 75 years old white male was highly suspicious for stage IV non-small cell lung cancer, pending tissue diagnosis which was reported at the time of this dictation to be squamous cell carcinoma.  He presented with right upper lobe lung mass  in addition to left upper lobe mass with loculated left pleural effusion and right hilar and and para mediastinal lymphadenopathy in addition to metastatic bone disease involving C2 and L5 as well as right ribs and right iliac crest. I personally and independently reviewed his PET scan as well as MRI of the brain.  I showed the images to the patient and his son. At the time of his visit, the final pathology was still pending.  I discussed with the patient the option of palliative radiotherapy to the C2 lesion to prevent any further deterioration and cord compression.  We will refer him to radiation oncology for this reason. We will bring the patient back for follow-up visit early next week for more detailed discussion of his treatment options based on the final pathology.  I will request the tissue block to be sent for PD-L1 expression. The patient was advised to call immediately if he has any other concerning symptoms in the interval. The total time spent in the appointment was 35 minutes. Disclaimer: This note was dictated with voice recognition software. Similar sounding words can inadvertently be transcribed and may be missed upon review. Eilleen Kempf, MD 04/22/20

## 2020-04-18 NOTE — Op Note (Addendum)
Video Bronchoscopy with Electromagnetic Navigation Procedure Note  Date of Operation: 04/18/2020  Pre-op Diagnosis: Bilateral lung masses   Post-op Diagnosis: Bilateral lung masses   Surgeon: [name], DO   Assistants: None   Anesthesia: General endotracheal anesthesia  Operation: Flexible video fiberoptic bronchoscopy with electromagnetic navigation and biopsies.  Estimated Blood Loss: Minimal  Complications: None   Indications and History: Riley Sharp is a 75 y.o. male with bilateral lung masses, left-sided pleural effusion.  The risks, benefits, complications, treatment options and expected outcomes were discussed with the patient.  The possibilities of pneumothorax, pneumonia, reaction to medication, pulmonary aspiration, perforation of a viscus, bleeding, failure to diagnose a condition and creating a complication requiring transfusion or operation were discussed with the patient who freely signed the consent.    Description of Procedure: The patient was seen in the Preoperative Area, was examined and was deemed appropriate to proceed.  The patient was taken to [facility] endoscopy room 2, identified as Riley Sharp and the procedure verified as Flexible Video Fiberoptic Bronchoscopy.  A Time Out was held and the above information confirmed.   Prior to the date of the procedure a high-resolution CT scan of the chest was performed. Utilizing [place] a virtual tracheobronchial tree was generated to allow the creation of distinct navigation pathways to the patient's parenchymal abnormalities. After being taken to the operating room general anesthesia was initiated and the patient  was orally intubated. The video fiberoptic bronchoscope was introduced via the endotracheal tube and a general inspection was performed which showed normal right and left lung anatomy with no evidence of endobronchial lesion.   Target #1 left upper lobe: The extendable working channel  and locator guide were introduced into the bronchoscope.  A full fluoroscopic sweep was obtained with inspiratory breath-hold from RAO 25 degrees to LAO 25 degrees with APL at 20 cm of water for local registration. The distinct navigation pathways prepared prior to this procedure were then utilized to navigate to within 1 cm of patient's lesion(s) identified on CT scan. The extendable working channel was secured into place and the locator guide was withdrawn. Under fluoroscopic guidance transbronchial needle brushings, transbronchial Wang needle biopsies, and transbronchial forceps biopsies were performed to be sent for cytology and pathology. A bronchioalveolar lavage was performed in the left upper lobe and sent for cytology.  Target #2 right upper lobe: The extendable working channel and locator guide were introduced into the bronchoscope.  A full fluoroscopic sweep was obtained with inspiratory breath-hold from RAO 25 degrees to LAO 25 degrees with APL at 20 cm of water for local registration. The distinct navigation pathways prepared prior to this procedure were then utilized to navigate to within 1.5 cm of patient's lesion(s) identified on CT scan. The extendable working channel was secured into place and the locator guide was withdrawn. Under fluoroscopic guidance transbronchial needle brushings, transbronchial Wang needle biopsies, and transbronchial forceps biopsies were performed to be sent for cytology and pathology. A bronchioalveolar lavage was performed in the right upper lobe and sent for cytology.  At the end of the procedure a general airway inspection was performed and there was no evidence of active bleeding.  The standard therapeutic bronchoscope was used for aspiration of the bilateral mainstem's and removal of any remaining blood clots and debris.  At the end of the procedure all distal subsegments were patent.  There was no evidence of active bleeding was a scope was brought to just above  the  main carina.  The bronchoscope was removed.  The patient tolerated the procedure well. There was no significant blood loss and there were no obvious complications. A post-procedural chest x-ray is pending.  Target #1 left upper lobe samples: 1. Transbronchial needle brushings from left upper lobe 2. Transbronchial Wang needle biopsies from left upper lobe 3. Transbronchial forceps biopsies from left upper lobe 4. Bronchoalveolar lavage from left upper lobe  Target #2 Right upper lobe samples: 1. Transbronchial needle brushings from right upper lobe 2. Transbronchial Wang needle biopsies from right upper lobe 3. Transbronchial forceps biopsies from right upper lobe 4. Bronchoalveolar lavage from right upper lobe  Plans:  The patient will be discharged from the PACU to home when recovered from anesthesia and after chest x-ray is reviewed. We will review the cytology, pathology results with the patient when they become available. Outpatient followup will be with [name], DO.   [name], DO [facility] 04/18/2020 2:34 PM

## 2020-04-18 NOTE — Anesthesia Postprocedure Evaluation (Signed)
Anesthesia Post Note  Patient: Riley Sharp  Procedure(s) Performed: VIDEO BRONCHOSCOPY WITH ENDOBRONCHIAL NAVIGATION (Bilateral ) THORACENTESIS (Left ) BRONCHIAL BRUSHINGS BRONCHIAL NEEDLE ASPIRATION BIOPSIES BRONCHIAL BIOPSIES BRONCHIAL WASHINGS     Patient location during evaluation: PACU Anesthesia Type: General Level of consciousness: awake and alert, oriented and patient cooperative Pain management: pain level controlled Vital Signs Assessment: post-procedure vital signs reviewed and stable Respiratory status: spontaneous breathing, nonlabored ventilation and respiratory function stable Cardiovascular status: blood pressure returned to baseline and stable Postop Assessment: no apparent nausea or vomiting Anesthetic complications: no   No complications documented.  Last Vitals:  Vitals:   04/18/20 1053 04/18/20 1440  BP: (!) 146/75 (!) 159/76  Pulse: 63 (!) 58  Resp: 20 (!) 24  Temp: 36.5 C   SpO2: 100% 96%    Last Pain:  Vitals:   04/18/20 1440  TempSrc:   PainSc: 0-No pain                 Pervis Hocking

## 2020-04-18 NOTE — Interval H&P Note (Signed)
History and Physical Interval Note:  04/18/2020 12:23 PM  Riley Sharp  has presented today for surgery, with the diagnosis of Lung mass.  The various methods of treatment have been discussed with the patient and family. After consideration of risks, benefits and other options for treatment, the patient has consented to  Procedure(s): [place] (Bilateral) POSSIBLE VIDEO BRONCHOSCOPY WITH ENDOBRONCHIAL ULTRASOUND (Bilateral) THORACENTESIS (Left) as a surgical intervention.  The patient's history has been reviewed, patient examined, no change in status, stable for surgery.  I have reviewed the patient's chart and labs.  Questions were answered to the patient's satisfaction.     [place]

## 2020-04-19 ENCOUNTER — Encounter (HOSPITAL_COMMUNITY): Payer: Self-pay

## 2020-04-19 ENCOUNTER — Ambulatory Visit (HOSPITAL_COMMUNITY): Payer: Medicare Other

## 2020-04-20 ENCOUNTER — Other Ambulatory Visit: Payer: Self-pay

## 2020-04-20 ENCOUNTER — Ambulatory Visit
Admit: 2020-04-20 | Discharge: 2020-04-20 | Disposition: A | Discharge status: Home / Self Care | Payer: Medicare Other | Attending: Family Medicine | Admitting: Family Medicine

## 2020-04-20 ENCOUNTER — Encounter (HOSPITAL_COMMUNITY): Payer: Self-pay | Admitting: Pulmonary Disease

## 2020-04-20 DIAGNOSIS — C801 Malignant (primary) neoplasm, unspecified: Secondary | ICD-10-CM | POA: Diagnosis not present

## 2020-04-20 DIAGNOSIS — R590 Localized enlarged lymph nodes: Secondary | ICD-10-CM | POA: Insufficient documentation

## 2020-04-20 DIAGNOSIS — C7951 Secondary malignant neoplasm of bone: Secondary | ICD-10-CM | POA: Insufficient documentation

## 2020-04-20 DIAGNOSIS — C3490 Malignant neoplasm of unspecified part of unspecified bronchus or lung: Secondary | ICD-10-CM | POA: Insufficient documentation

## 2020-04-20 DIAGNOSIS — J90 Pleural effusion, not elsewhere classified: Secondary | ICD-10-CM | POA: Diagnosis not present

## 2020-04-20 LAB — GLUCOSE, CAPILLARY: Glucose-Capillary: 90 mg/dL (ref 70–99)

## 2020-04-20 MED ORDER — FLUDEOXYGLUCOSE F - 18 (FDG) INJECTION
7.9000 | Freq: Once | INTRAVENOUS | Status: AC | PRN
  Administered 2020-04-20: 8 via INTRAVENOUS

## 2020-04-21 ENCOUNTER — Inpatient Hospital Stay (HOSPITAL_BASED_OUTPATIENT_CLINIC_OR_DEPARTMENT_OTHER): Payer: Medicare Other | Admitting: Physician Assistant

## 2020-04-21 VITALS — BP 128/69 | HR 91 | Temp 98.9°F | Resp 14 | Ht 67.0 in | Wt 157.1 lb

## 2020-04-21 DIAGNOSIS — Z801 Family history of malignant neoplasm of trachea, bronchus and lung: Secondary | ICD-10-CM | POA: Diagnosis not present

## 2020-04-21 DIAGNOSIS — C3412 Malignant neoplasm of upper lobe, left bronchus or lung: Secondary | ICD-10-CM | POA: Diagnosis not present

## 2020-04-21 DIAGNOSIS — R918 Other nonspecific abnormal finding of lung field: Secondary | ICD-10-CM | POA: Diagnosis not present

## 2020-04-21 DIAGNOSIS — J90 Pleural effusion, not elsewhere classified: Secondary | ICD-10-CM | POA: Diagnosis not present

## 2020-04-21 DIAGNOSIS — C3411 Malignant neoplasm of upper lobe, right bronchus or lung: Secondary | ICD-10-CM | POA: Diagnosis not present

## 2020-04-21 DIAGNOSIS — C7951 Secondary malignant neoplasm of bone: Secondary | ICD-10-CM | POA: Diagnosis not present

## 2020-04-22 ENCOUNTER — Telehealth: Payer: Self-pay | Admitting: Physician Assistant

## 2020-04-22 LAB — BODY FLUID CULTURE W GRAM STAIN: Culture: NO GROWTH

## 2020-04-22 LAB — CYTOLOGY - NON PAP

## 2020-04-22 NOTE — Telephone Encounter (Signed)
I called the patient to let him know that the preliminary pathology report is NSCLC. However, they are doing further testing to differential the subtype. Once we have the final report, we will get him back in the clinic to discuss the treatment plan.

## 2020-04-22 NOTE — Telephone Encounter (Signed)
I called the patient to let him know the final pathology is squamous cell carcinoma. I will send a scheduling message to get him in early next week for an office visit and to discuss his treatment in more details.

## 2020-04-23 NOTE — Progress Notes (Signed)
[facility] OFFICE PROGRESS NOTE  Riley Small, MD Riley Sharp [address] [place] [address]  DIAGNOSIS: Stage IV non-Sharp cell lung cancer, squamous cell carcinoma.  He presented with a right upper lobe lung mass, left upper lobe lung mass, and a loculated left pleural effusion.  He had mild hypermetabolism in the right hilar and paramediastinal lymphadenopathy.  He had a few Sharp bone lesions on C2, L5, right ribs, and right iliac crest.  He was diagnosed in March 2021  PD-L1: Pending  PRIOR THERAPY: None  CURRENT THERAPY: Systemic chemotherapy with carboplatin for an AUC of 5, paclitaxel 175 mg per metered squared, Keytruda 200 mg IV 3 weeks with Neulasta support.  First dose expected on 05/03/2019  INTERVAL HISTORY: Riley Sharp 75 y.o. male returns to clinic today for a follow-up visit accompanied by his son. The patient was seen in the clinic last week to review his PET scan and brain MRI.  The final pathology was not available at that time in order for [place] to discuss the final treatment plan. Overall, he is feeling similar. His main concern is related to the occasional left sided chest tightness/sharp pain. This radiates to his back near his L shoulder blade,especially with sneezing/coughing.He has not tried taking anything for cough. On Saturday, he had some right back/shoulder blade pain. He takes two extra strength tylenol twice a day. His pain has improved somewhat since this time. He reports some blood streaked mucus this morning. He denies any fever, chills, or night sweats. He reports that he lost approximately 10 pounds since early January 2022 but none since his last appointment. He denies any nausea, vomiting, diarrhea, or constipation. He is here for evaluation and to review his results and for a more detailed discussion about his current condition and recommended treatment options.    MEDICAL HISTORY: Past Medical History:  Diagnosis Date  . AAA  (abdominal aortic aneurysm) ([facility])   . CAD (coronary artery disease)   . Cancer ([facility])    basal cell carcimona right ear and upper left at shoulder  . Glaucoma   . Hyperlipidemia   . Hypertension   . Myocardial infarction ([place]) 03/2003  . Wears glasses     ALLERGIES:  has No Known Allergies.  MEDICATIONS:  Current Outpatient Medications  Medication Sig Dispense Refill  . aspirin EC 81 MG tablet Take 81 mg by mouth daily.    . brimonidine (ALPHAGAN) 0.2 % ophthalmic solution Place 1 drop into both eyes 3 (three) times daily.    . dorzolamide (TRUSOPT) 2 % ophthalmic solution Place 1 drop into both eyes 3 (three) times daily.    Marland Kitchen latanoprost (XALATAN) 0.005 % ophthalmic solution Place 1 drop into both eyes at bedtime.    . lidocaine-prilocaine (EMLA) cream Apply 1 application topically as needed. 30 g 2  . metoprolol succinate (TOPROL-XL) 25 MG 24 hr tablet Take 25 mg by mouth daily.    . prochlorperazine (COMPAZINE) 10 MG tablet Take 1 tablet (10 mg total) by mouth every 6 (six) hours as needed. 30 tablet 2  . ramipril (ALTACE) 5 MG tablet Take 5 mg by mouth daily.    . rosuvastatin (CRESTOR) 10 MG tablet Take 10 mg by mouth daily.    . timolol (TIMOPTIC) 0.5 % ophthalmic solution Place 1 drop into both eyes daily.     No current facility-administered medications for this visit.    SURGICAL HISTORY:  Past Surgical History:  Procedure Laterality Date  .  ABDOMINAL AORTIC ENDOVASCULAR STENT GRAFT N/A 11/14/2015   Procedure: ABDOMINAL AORTIC ENDOVASCULAR STENT GRAFT;  Surgeon: Elam Dutch, MD;  Location: [facility];  Service: Vascular;  Laterality: N/A;  . APPENDECTOMY    . BRONCHIAL BIOPSY  04/18/2020   Procedure: BRONCHIAL BIOPSIES;  Surgeon: [name], DO;  Location: [facility];  Service: Pulmonary;;  . BRONCHIAL BRUSHINGS  04/18/2020   Procedure: BRONCHIAL BRUSHINGS;  Surgeon: [name], DO;  Location: [facility];  Service: Pulmonary;;  . BRONCHIAL NEEDLE  ASPIRATION BIOPSY  04/18/2020   Procedure: BRONCHIAL NEEDLE ASPIRATION BIOPSIES;  Surgeon: [name], DO;  Location: [facility];  Service: Pulmonary;;  . BRONCHIAL WASHINGS  04/18/2020   Procedure: BRONCHIAL WASHINGS;  Surgeon: [name], DO;  Location: [facility];  Service: Pulmonary;;  . CHOLECYSTECTOMY  2007   Gall Bladder  . COLONOSCOPY W/ BIOPSIES AND POLYPECTOMY    . EYE SURGERY Bilateral    laser for glaucoma both eyesn x 2  . heart stent  Feb. 14, 2005  . THORACENTESIS Left 04/18/2020   Procedure: THORACENTESIS;  Surgeon: [name], DO;  Location: [facility];  Service: Pulmonary;  Laterality: Left;  Marland Kitchen VIDEO BRONCHOSCOPY WITH ENDOBRONCHIAL NAVIGATION Bilateral 04/18/2020   Procedure: VIDEO BRONCHOSCOPY WITH ENDOBRONCHIAL NAVIGATION;  Surgeon: [name], DO;  Location: [facility];  Service: Pulmonary;  Laterality: Bilateral;    REVIEW OF SYSTEMS:   Review of Systems  Constitutional:Negative for appetite change, weight loss, chills, fatigue,and fever. HENT: Negative for mouth sores, nosebleeds, sore throat and trouble swallowing.  Eyes: Negative for eye problems and icterus.  Respiratory:Positive for cough, blood tinged sputum,and dyspnea on exertion.  Cardiovascular:Positive for left chest tightness.  Negative for leg swelling.  Gastrointestinal:Negative for constipation, diarrhea, nausea and vomiting.  Genitourinary: Negative for bladder incontinence, difficulty urinating, dysuria, frequency and hematuria.  Musculoskeletal: Negative for back pain, gait problem, neck pain and neck stiffness.  Skin: Negative for itching and rash.  Neurological: Negative for dizziness, extremity weakness, gait problem, headaches, light-headedness and seizures.  Hematological: Negative for adenopathy. Does not bruise/bleed easily.  Psychiatric/Behavioral: Negative for confusion, depression and sleep disturbance. The patient is not nervous/anxious.     PHYSICAL EXAMINATION:  Blood pressure (!) 143/71, pulse 72, temperature (!) 96.9 F (36.1 C), temperature source Tympanic, resp. rate 18, weight 156 lb 9.6 oz (71 kg), SpO2 99 %.  ECOG PERFORMANCE STATUS: 1 - Symptomatic but completely ambulatory  Physical Exam  Constitutional: Oriented to person, place, and time and well-developed, well-nourished, and in no distress.  HENT:  Head: Normocephalic and atraumatic.  Mouth/Throat: Oropharynx is clear and moist. No oropharyngeal exudate.  Eyes: Conjunctivae are normal. Right eye exhibits no discharge. Left eye exhibits no discharge. No scleral icterus.  Neck: Normal range of motion. Neck supple.  Cardiovascular: Normal rate, regular rhythm, normal heart sounds and intact distal pulses.  Pulmonary/Chest: Effort normal and breath sounds normal. No respiratory distress. No wheezes. No rales.  Abdominal: Soft. Bowel sounds are normal. Exhibits no distension and no mass. There is no tenderness.  Musculoskeletal: Normal range of motion. Exhibits no edema.  Lymphadenopathy:  No cervical adenopathy.  Neurological: Alert and oriented to person, place, and time. Exhibits normal muscle tone. Gait normal. Coordination normal.  Skin: Skin is warm and dry. No rash noted. Not diaphoretic. No erythema. No pallor.  Psychiatric: Mood, memory and judgment normal.  Vitals reviewed.  LABORATORY DATA: Lab Results  Component Value Date   WBC 11.5 (H) 04/07/2020  HGB 14.4 04/07/2020   HCT 44.7 04/07/2020   MCV 91.6 04/07/2020   PLT 224 04/07/2020      Chemistry      Component Value Date/Time   NA 138 04/07/2020 0925   K 5.3 (H) 04/07/2020 0925   CL 105 04/07/2020 0925   CO2 25 04/07/2020 0925   BUN 12 04/07/2020 0925   CREATININE 1.02 04/07/2020 0925      Component Value Date/Time   CALCIUM 9.7 04/07/2020 0925   ALKPHOS 84 04/07/2020 0925   AST 12 (L) 04/07/2020 0925   ALT 13 04/07/2020 0925   BILITOT 0.4 04/07/2020 0925        RADIOGRAPHIC STUDIES:  DG Chest 2 View  Result Date: 03/31/2020 CLINICAL DATA:  75 year old male with history of hemoptysis and cough for the past 2 months. EXAM: CHEST - 2 VIEW COMPARISON:  Chest x-ray 11/14/2015. FINDINGS: New multifocal nodular and masslike opacities in the lungs bilaterally (left greater than right) with what appears to be a partially loculated left pleural effusion. No evidence of pulmonary edema. No pneumothorax. Heart size is normal. Prominence of left hilar structures. IMPRESSION: 1. Findings are highly concerning for probable metastatic disease in the chest, as detailed above. Further evaluation with contrast enhanced chest CT is strongly recommended at this time to better evaluate these findings. These results will be called to the ordering clinician or representative by the Radiologist Assistant, and communication documented in the PACS or [employer]. Electronically Signed   By: Vinnie Langton M.D.   On: 03/31/2020 09:16   CT CHEST W CONTRAST  Result Date: 04/01/2020 CLINICAL DATA:  Hemoptysis.  Abnormal chest x-ray. EXAM: CT CHEST WITH CONTRAST TECHNIQUE: Multidetector CT imaging of the chest was performed during intravenous contrast administration. CONTRAST:  21m ISOVUE-300 IOPAMIDOL (ISOVUE-300) INJECTION 61% COMPARISON:  March 30, 2020. FINDINGS: Cardiovascular: Atherosclerosis of thoracic aorta is noted without aneurysm or dissection. Normal cardiac size. No pericardial effusion. Coronary artery calcifications are noted. Mediastinum/Nodes: No enlarged mediastinal, hilar, or axillary lymph nodes. Thyroid gland, trachea, and esophagus demonstrate no significant findings. Lungs/Pleura: No pneumothorax is noted. 4.3 x 3.1 cm spiculated mass is noted in right upper lobe consistent with malignancy. 4.4 x 3.9 cm lobulated mass is noted in left upper lobe consistent with malignancy. Loculated left pleural effusion is noted. Upper Abdomen: Possible 4.3 x 3.3 cm  heterogeneous mass is noted in the porta hepatis region concerning for malignancy. Musculoskeletal: No chest wall abnormality. No acute or significant osseous findings. IMPRESSION: 1. 4.3 x 3.1 cm spiculated mass is noted in right upper lobe consistent with malignancy. 4.4 x 3.9 cm lobulated mass is noted in left upper lobe consistent with malignancy. Loculated left pleural effusion is noted. PET scan is recommended for further evaluation. 2. Possible 4.3 x 3.3 cm heterogeneous mass is noted in the porta hepatis region concerning for malignancy. 3. Coronary artery calcifications are noted suggesting coronary artery disease. 4. Aortic atherosclerosis. Aortic Atherosclerosis (ICD10-I70.0). These results will be called to the ordering clinician or representative by the Radiologist Assistant, and communication documented in the PACS or [employer]. Electronically Signed   By: JMarijo ConceptionM.D.   On: 04/01/2020 08:33   MR Brain W Wo Contrast  Result Date: 04/18/2020 CLINICAL DATA:  Lung cancer, follow-up EXAM: MRI HEAD WITHOUT AND WITH CONTRAST TECHNIQUE: Multiplanar, multiecho pulse sequences of the brain and surrounding structures were obtained without and with intravenous contrast. CONTRAST:  712mGADAVIST GADOBUTROL 1 MMOL/ML IV SOLN COMPARISON:  None.  FINDINGS: Brain: There are two punctate foci of cortical diffusion hyperintensity in the left parieto-occipital region. No evidence of intracranial hemorrhage. There is no intracranial mass, mass effect, or edema. There is no hydrocephalus or extra-axial fluid collection. Prominence of the ventricles and sulci reflects minor generalized parenchymal volume loss. Minimal patchy T2 hyperintensity in the supratentorial white matter is nonspecific may reflect minor chronic microvascular ischemic changes. No abnormal enhancement. Vascular: Major vessel flow voids at the skull base are preserved. Skull and upper cervical spine: Normal marrow signal is preserved.  Sinuses/Orbits: Paranasal sinuses are aerated. Orbits are unremarkable. Other: Sella is unremarkable. Mastoid air cells are clear. Sharp retention cysts along the posterior nasopharyngeal wall. IMPRESSION: Two punctate likely subacute left parietooccipital infarcts. No evidence of intracranial metastatic disease. Electronically Signed   By: Macy Mis M.D.   On: 04/18/2020 08:50   NM PET Image Initial (PI) Skull Base To Thigh  Result Date: 04/20/2020 CLINICAL DATA:  Initial treatment strategy for bilateral upper lobe lung masses. EXAM: NUCLEAR MEDICINE PET SKULL BASE TO THIGH TECHNIQUE: 8.0 mCi F-18 FDG was injected intravenously. Full-ring PET imaging was performed from the skull base to thigh after the radiotracer. CT data was obtained and used for attenuation correction and anatomic localization. Fasting blood glucose: 90 mg/dl COMPARISON:  Chest CT on 03/31/2020 FINDINGS: Mediastinal blood-pool activity (background): SUV max = 2.6 Liver activity (reference): SUV max = N/A NECK:  No hypermetabolic lymph nodes or masses. Incidental CT findings:  None. CHEST: Spiculated mass in the right upper lobe measuring 3.9 cm is hypermetabolic, with SUV max of 11.9. Similar mass in the left upper lobe measures 4.5 cm has also hypermetabolic, with SUV max of 10.5. Nodular hypermetabolic activity is also seen throughout the left pleural space with Sharp pleural effusion, consistent with malignant pleural effusion. Mild lymphadenopathy is seen in the right hilum and right paratracheal region, with SUV max 9.9 and 5.7 respectively. Incidental CT findings:  None. ABDOMEN/PELVIS: No abnormal hypermetabolic activity within the liver, pancreas, adrenal glands, or spleen. No hypermetabolic lymph nodes in the abdomen or pelvis. Incidental CT findings: Prior aortic stent graft repair abdominal aortic aneurysm, with stable size of native aneurysm sac. Diverticulosis is seen mainly involving the descending and sigmoid colon,  however there is no evidence of diverticulitis. SKELETON: Several Sharp hypermetabolic bone metastases are seen in the C2 vertebral body, right ribs, L5 vertebral body, and right iliac crest. Incidental CT findings:  None. IMPRESSION: Hypermetabolic bilateral upper lobe pulmonary masses, consistent with synchronous bronchogenic carcinomas. Mild hypermetabolic right hilar and paramediastinal lymphadenopathy, consistent with lymph node metastases. Sharp left pleural effusion with diffuse hypermetabolic activity, consistent with metastatic disease. Hypermetabolic bone metastases in the cervical and lumbar spine, right ribs, and right ilium. Electronically Signed   By: Marlaine Hind M.D.   On: 04/20/2020 15:53   DG CHEST PORT 1 VIEW  Result Date: 04/18/2020 CLINICAL DATA:  Status post bronchoscopy EXAM: PORTABLE CHEST 1 VIEW COMPARISON:  03/30/2020 chest radiograph and CT of 03/31/2020. FINDINGS: Patient rotated left. Normal heart size. Sharp left pleural effusion with loculation laterally, similar. No right-sided pleural fluid. No pneumothorax. Worsened aeration, with left greater than right airspace disease superimposed upon bilateral upper lobe lung masses. IMPRESSION: No pneumothorax or other acute complication after bronchoscopy. Similar loculated left-sided pleural effusion. Bilateral upper lobe lung masses. Worsened aeration with left greater than right superimposed airspace disease. Consider infection. Electronically Signed   By: Abigail Miyamoto M.D.   On: 04/18/2020 15:15   VAS  [place] ABI WITH/WO TBI  Result Date: 04/15/2020 LOWER EXTREMITY DOPPLER STUDY Indications: Peripheral artery disease. Other Factors: Occasional left hip and buttock pain with walking.  Vascular Interventions: EVAR 11/14/15. Comparison Study: No previous ABI. EVAR exam on 04/09/19 suggested stenosis at                   the distal left limb. Performing Technologist: Ralene Cork RVT  Examination Guidelines: A complete evaluation includes  at minimum, Doppler waveform signals and systolic blood pressure reading at the level of bilateral brachial, anterior tibial, and posterior tibial arteries, when vessel segments are accessible. Bilateral testing is considered an integral part of a complete examination. Photoelectric Plethysmograph (PPG) waveforms and toe systolic pressure readings are included as required and additional duplex testing as needed. Limited examinations for reoccurring indications may be performed as noted.  ABI Findings: +---------+------------------+-----+--------+--------+ Right    Rt Pressure (mmHg)IndexWaveformComment  +---------+------------------+-----+--------+--------+ Brachial 120                                     +---------+------------------+-----+--------+--------+ PTA      132               1.10 biphasic         +---------+------------------+-----+--------+--------+ DP       0                 0.00 absent           +---------+------------------+-----+--------+--------+ Great Toe74                0.62                  +---------+------------------+-----+--------+--------+ +---------+------------------+-----+--------+-------+ Left     Lt Pressure (mmHg)IndexWaveformComment +---------+------------------+-----+--------+-------+ Brachial 120                                    +---------+------------------+-----+--------+-------+ PTA      130               1.08 biphasic        +---------+------------------+-----+--------+-------+ DP       0                 0.00 absent          +---------+------------------+-----+--------+-------+ Great Toe77                0.64                 +---------+------------------+-----+--------+-------+ +-------+-----------+-----------+------------+------------+ ABI/TBIToday's ABIToday's TBIPrevious ABIPrevious TBI +-------+-----------+-----------+------------+------------+ Right  1.1        0.62                                 +-------+-----------+-----------+------------+------------+ Left   1.08       0.64                                +-------+-----------+-----------+------------+------------+  No previous ABI.  Summary: Right: Resting right ankle-brachial index is within normal range. No evidence of significant right lower extremity arterial disease. The right toe-brachial index is abnormal. RT great toe pressure = 74 mmHg. Left: Resting left ankle-brachial index is within normal range. No evidence of significant left lower extremity arterial  disease. The left toe-brachial index is abnormal. LT Great toe pressure = 77 mmHg.  *See table(s) above for measurements and observations.  Electronically signed by Servando Snare MD on 04/15/2020 at 9:59:04 AM.    Final    VAS [place] EVAR DUPLEX  Result Date: 04/15/2020 ABDOMINAL AORTA STUDY Indications: Follow up exam for EVAR. Surgery date EVAR 11/14/15.  Comparison Study: 04/09/19: 4.81 x 5.27 cm Patent endovascular aneurysm repair                   with no evidence of endoleak. Performing Technologist: Ralene Cork RVT  Examination Guidelines: A complete evaluation includes B-mode imaging, spectral Doppler, color Doppler, and power Doppler as needed of all accessible portions of each vessel. Bilateral testing is considered an integral part of a complete examination. Limited examinations for reoccurring indications may be performed as noted.  Endovascular Aortic Repair (EVAR): +----------+----------------+-------------------+-------------------+           Diameter AP (cm)Diameter Trans (cm)Velocities (cm/sec) +----------+----------------+-------------------+-------------------+ Aorta     4.73            5.26               24                  +----------+----------------+-------------------+-------------------+ Right Limb1.65            1.63               33                  +----------+----------------+-------------------+-------------------+ Left Limb 1.90             1.71               33                  +----------+----------------+-------------------+-------------------+  Summary: Abdominal Aorta: Patent endovascular aneurysm repair with no evidence of endoleak.  *See table(s) above for measurements and observations.  Electronically signed by Servando Snare MD on 04/15/2020 at 10:01:01 AM.   Final    DG C-ARM BRONCHOSCOPY  Result Date: 04/18/2020 C-ARM BRONCHOSCOPY: Fluoroscopy was utilized by the requesting physician.  No radiographic interpretation.     ASSESSMENT/PLAN:  This is a very pleasant 75 year old Caucasian male diagnosed with stage IV non-Sharp cell lung cancer, squamous cell carcinoma.  The patient presented with a right upper lobe lung mass, left upper lobe lung mass, loculated left pleural effusion, mild hypermetabolism in the right hilar anterior mediastinal lymph nodes, and a few Sharp metastatic bone lesions on C2, L5, right ribs, and right iliac crest.  He was diagnosed in March 2022.  His PD-L1 expression is pending.  Dr. Julien Nordmann had a lengthly discussion with the patient today about his current condition and treatment options. Dr. Julien Nordmann gave the patient the option of treatment of palliative/hospice vs systemic chemotherapy with carboplatin for an AUC of 5, paclitaxel 175 mg/m2, and Keytruda 200 mg IV every 3 weeks with neulasta support. Dr. Julien Nordmann discussed that if his PDL1 expression is very high (at least 50%), then he can consider single agent immunotherapy with [facility].  The patient is interested in proceeding with systemic chemotherapy for now. He is expected to start her first dose of this treatment on 05/04/20.  We discussed the adverse side effects of treatment including but not limited to alopecia, myelosuppression, nausea and vomiting, peripheral neuropathy, liver or renal dysfunction as well as immunotherapy mediated adverse effects.   I will arrange for  the patient to have a chemoeducation class prior to receiving his first  cycle of chemotherapy.     I sent a prescription for Compazine 10 mg every 6 hours as needed for nausea to his pharmacy. I also sent EMLA cream to the pharmacy. The patient is interested in have a port-a-cath placed.   The patient will follow-up in 2 weeks for a one-week follow-up visit after completing his first cycle of chemotherapy.  I discussed the Sharp C2 lesion with radiation oncology. There is no MRI correlated lesion.  They recommended follow up MRI in 3 months. I have placed the order today.   The patient was advised to call immediately if he has any concerning symptoms in the interval. The patient voices understanding of current disease status and treatment options and is in agreement with the current care plan. All questions were answered. The patient knows to call the clinic with any problems, questions or concerns. We can certainly see the patient much sooner if necessary      Orders Placed This Encounter  Procedures  . MR Neck Soft Tissue W Contrast    Standing Status:   Future    Standing Expiration Date:   04/26/2021    Order Specific Question:   If indicated for the ordered procedure, I authorize the administration of contrast media per Radiology protocol    Answer:   Yes    Order Specific Question:   What is the patient's sedation requirement?    Answer:   No Sedation    Order Specific Question:   Does the patient have a pacemaker or implanted devices?    Answer:   No    Order Specific Question:   Preferred imaging location?    Answer:   [facility] (table limit - 550 lbs)  . IR Perc Tun Perit Cath W/Port    Standing Status:   Future    Standing Expiration Date:   04/26/2021    Order Specific Question:   Reason for exam:    Answer:   Going to start chemotherapy around 05/04/20.    Order Specific Question:   Preferred Imaging Location?    Answer:   [facility]  . CBC with Differential ([place] Only)    Standing Status:   Standing    Number  of Occurrences:   12    Standing Expiration Date:   04/26/2021  . CMP ([facility] only)    Standing Status:   Standing    Number of Occurrences:   12    Standing Expiration Date:   04/26/2021  . TSH    Standing Status:   Standing    Number of Occurrences:   5    Standing Expiration Date:   04/26/2021       Tobe Sos Bristol Osentoski, PA-C 04/26/20  ADDENDUM: Hematology/Oncology Attending: I had a face-to-face encounter with the patient today.  I reviewed his record, scan and recommended his care plan.  This is a very pleasant 75 years old white male recently diagnosed with a stage IV (T2b, N2, M1 C) non-Sharp cell lung cancer, squamous cell carcinoma presented with bilateral upper lobe pulmonary masses in addition to right hilar and paramediastinal lymphadenopathy as well as Sharp left pleural effusion and bone metastasis in the cervical, lumbar spines as well as right ribs and right ilium diagnosed in March 2022. I had a lengthy discussion with the patient and his son today about his current disease stage, prognosis and treatment options.  I discussed with the patient the option of palliative care and hospice referral versus palliative systemic chemotherapy with carboplatin for AUC of 5, paclitaxel 175 mg/M2 and Keytruda 200 mg IV every 3 weeks with Neulasta support but we will also check his PD-L1 expression and if it is significantly high over 50% the patient could be considered for treatment with single agent immunotherapy but this test is still pending. The patient is interested in systemic therapy and he is in agreement to proceed with the systemic chemotherapy in combination with immunotherapy unless he has high PD-L1 expression. I discussed with him the adverse effect of this treatment including but not limited to alopecia, myelosuppression, nausea and vomiting, peripheral neuropathy, liver or renal dysfunction as well as immunotherapy adverse effects. He is expected to start the first  cycle of this treatment next week. We will arrange for the patient to have a Port-A-Cath placed by interventional radiology.  We will also arrange for the patient to have a chemotherapy education class before the first dose of treatment.  For the bone metastasis he was referred to Dr. Lisbeth Renshaw but he would like to continue to monitor him for now since the patient is currently asymptomatic from his bone disease. We will arrange for him to have MRI of the cervical spine in few months for further evaluation of the C2 lesion. The patient will come back for follow-up visit in 2 weeks for evaluation and management of any adverse effect of his treatment. He was advised to call immediately if he has any other concerning symptoms in the interval. The total time spent in the appointment was 36 minutes. Disclaimer: This note was dictated with voice recognition software. Similar sounding words can inadvertently be transcribed and may be missed upon review. Eilleen Kempf, MD 04/26/20

## 2020-04-26 ENCOUNTER — Other Ambulatory Visit: Payer: Self-pay

## 2020-04-26 ENCOUNTER — Encounter: Payer: Self-pay | Admitting: Physician Assistant

## 2020-04-26 ENCOUNTER — Inpatient Hospital Stay (HOSPITAL_BASED_OUTPATIENT_CLINIC_OR_DEPARTMENT_OTHER): Payer: Medicare Other | Admitting: Physician Assistant

## 2020-04-26 ENCOUNTER — Other Ambulatory Visit: Payer: Self-pay | Admitting: Internal Medicine

## 2020-04-26 VITALS — BP 143/71 | HR 72 | Temp 96.9°F | Resp 18 | Wt 156.6 lb

## 2020-04-26 DIAGNOSIS — R5383 Other fatigue: Secondary | ICD-10-CM

## 2020-04-26 DIAGNOSIS — C7951 Secondary malignant neoplasm of bone: Secondary | ICD-10-CM

## 2020-04-26 DIAGNOSIS — Z801 Family history of malignant neoplasm of trachea, bronchus and lung: Secondary | ICD-10-CM | POA: Diagnosis not present

## 2020-04-26 DIAGNOSIS — J90 Pleural effusion, not elsewhere classified: Secondary | ICD-10-CM | POA: Diagnosis not present

## 2020-04-26 DIAGNOSIS — Z5111 Encounter for antineoplastic chemotherapy: Secondary | ICD-10-CM | POA: Diagnosis not present

## 2020-04-26 DIAGNOSIS — Z7189 Other specified counseling: Secondary | ICD-10-CM | POA: Diagnosis not present

## 2020-04-26 DIAGNOSIS — C3490 Malignant neoplasm of unspecified part of unspecified bronchus or lung: Secondary | ICD-10-CM | POA: Diagnosis not present

## 2020-04-26 DIAGNOSIS — Z5112 Encounter for antineoplastic immunotherapy: Secondary | ICD-10-CM | POA: Diagnosis not present

## 2020-04-26 DIAGNOSIS — C3412 Malignant neoplasm of upper lobe, left bronchus or lung: Secondary | ICD-10-CM | POA: Diagnosis not present

## 2020-04-26 DIAGNOSIS — C3411 Malignant neoplasm of upper lobe, right bronchus or lung: Secondary | ICD-10-CM | POA: Diagnosis not present

## 2020-04-26 DIAGNOSIS — C3491 Malignant neoplasm of unspecified part of right bronchus or lung: Secondary | ICD-10-CM | POA: Insufficient documentation

## 2020-04-26 DIAGNOSIS — R918 Other nonspecific abnormal finding of lung field: Secondary | ICD-10-CM | POA: Diagnosis not present

## 2020-04-26 MED ORDER — PROCHLORPERAZINE MALEATE 10 MG PO TABS: 10.0000 mg | ORAL_TABLET | Freq: Four times a day (QID) | ORAL | 2 refills | Status: AC | PRN

## 2020-04-26 MED ORDER — LIDOCAINE-PRILOCAINE 2.5-2.5 % EX CREA: 1.0000 "application " | TOPICAL_CREAM | CUTANEOUS | 2 refills | Status: AC | PRN

## 2020-04-26 NOTE — Progress Notes (Signed)
START ON PATHWAY REGIMEN - Non-Small Cell Lung     A cycle is every 21 days:     Pembrolizumab      Paclitaxel      Carboplatin   **Always confirm dose/schedule in your pharmacy ordering system**  Patient Characteristics: Stage IV Metastatic, Squamous, Molecular Analysis Not Elected, PS = 0, 1, Initial Chemotherapy/Immunotherapy, Candidate for Immunotherapy, PD-L1 Expression Positive 1-49% (TPS) / Negative / Not Tested / Awaiting Test Results and Immunotherapy Candidate Therapeutic Status: Stage IV Metastatic Histology: Squamous Cell ECOG Performance Status: 1 Chemotherapy/Immunotherapy Line of Therapy: Initial Chemotherapy/Immunotherapy Immunotherapy Candidate Status: Candidate for Immunotherapy PD-L1 Expression Status: Awaiting Test Results Intent of Therapy: Non-Curative / Palliative Intent, Discussed with Patient 

## 2020-04-26 NOTE — Patient Instructions (Addendum)
-  There are two main categories of lung cancer, they are named based on the size of the cancer cell. One is called Non-Small cell lung cancer. The other type is Small Cell Lung Cancer -The sample (biopsy) that they took of your tumor was consistent with a subtype of Non-small cell lung cancer called Squamous Cell Carcinoma.  -We covered a lot of important information at your appointment today regarding what the treatment plan is moving forward. Here are the the main points that were discussed at your office visit with [place] today:  -The treatment that you will receive consists of two chemotherapy drugs, called Carboplatin and Paclitaxel (also referred to as Taxol) and one immunotherapy drug called Keytruda (pembrolizumab).  -If your immunotherapy marker comes back high (at least over 50%), then you can consider taking immunotherapy alone without chemotherapy.  -We are planning on starting your treatment next week on 05/04/20 but before your start your treatment, I would like you to attend a Chemotherapy Education Class. This involves having you sit down with one of our nurse educators. She will discuss with you one-on-one more details about your treatment as well as general information about resources here at the cancer center.  -Your treatment will be given once every 3 weeks. We will check your labs once a week for the first ~5 treatments just to make sure that important components of your blood are in an acceptable range -You will need to return 2 days after your treatment to receive an injection. This injection is important because it boosts your infection fighting cells in your body and helps protect you from getting an infection. This injection can cause aching for several days. We typically instruct patient's to take tylenol and claritin daily for 5 days after the shot  -We will get a CT scan after 3 treatments to check on the progress of treatment  Medications:  -Compazine was sent to your pharmacy.  This medication is for nausea. You may take this every 6 hours as needed if you feel nausous.  -I will send numbing cream to the pharmacy for the port. Once able to use the cream, you can place a glob of numbing cream on the port. You do not need to rub this in. Just put kitchen plastic wrap over the cream and come to the appointment.   Referrals or Imaging:  -I will arrange for a follow up MRI of the neck in 3 months to re-evaluate the spot on the spine/upper neck.   Follow up:  -We will see you back for a follow up visit in about 2 weeks to see how your first treatment went and to make sure you are not having any side effects from treatment.   If you need to contact our office, please do not hesitate, we are here to help. Our number is [phone]. When you call, ask to speak to Cassie's or Dr. Worthy Flank nurse.

## 2020-04-27 ENCOUNTER — Telehealth: Payer: Self-pay | Admitting: Emergency Medicine

## 2020-04-27 ENCOUNTER — Telehealth: Payer: Self-pay

## 2020-04-27 NOTE — Telephone Encounter (Signed)
I spoke with pt and he has been advised his port placement has been scheduled at [facility] with a 7am arrival. Pt has been advised NPO but can take morning meds with a sip of water and must have a driver. Pt was advised to arrive to Admitting by 7am. Pt expressed understanding of this information.

## 2020-04-27 NOTE — Telephone Encounter (Signed)
A Randomized pragmatic Chair-Based Home Exercise Intervention for Mitigating Cancer-Related Fatigue in Older Adults Undergoing Chemotherapy for Advanced Disease  Called patient to introduce this research study.  Informed patient of the study's goals and procedures including that this study would involve three visits consisting of questionnaires, physical functions testing and exercise education.    Patient stated he would be interested in learning more.  Scheduled time to give the patient information about the study and review consent documents on Friday, 04/29/20, after his chemo education class.  Patient was given this coordinator's contact information if he has any questions in the meantime.  Clabe Seal Clinical Research Coordinator I  04/27/20 2:47 PM

## 2020-04-29 ENCOUNTER — Inpatient Hospital Stay: Payer: Medicare Other | Attending: Physician Assistant

## 2020-04-29 ENCOUNTER — Inpatient Hospital Stay: Payer: Medicare Other | Admitting: Emergency Medicine

## 2020-04-29 ENCOUNTER — Other Ambulatory Visit: Payer: Self-pay

## 2020-04-29 DIAGNOSIS — Z452 Encounter for adjustment and management of vascular access device: Secondary | ICD-10-CM | POA: Insufficient documentation

## 2020-04-29 DIAGNOSIS — Z5111 Encounter for antineoplastic chemotherapy: Secondary | ICD-10-CM | POA: Insufficient documentation

## 2020-04-29 DIAGNOSIS — C3411 Malignant neoplasm of upper lobe, right bronchus or lung: Secondary | ICD-10-CM | POA: Insufficient documentation

## 2020-04-29 DIAGNOSIS — Z79899 Other long term (current) drug therapy: Secondary | ICD-10-CM | POA: Insufficient documentation

## 2020-04-29 DIAGNOSIS — Z5112 Encounter for antineoplastic immunotherapy: Secondary | ICD-10-CM | POA: Insufficient documentation

## 2020-04-29 DIAGNOSIS — C3412 Malignant neoplasm of upper lobe, left bronchus or lung: Secondary | ICD-10-CM | POA: Insufficient documentation

## 2020-04-29 DIAGNOSIS — C3491 Malignant neoplasm of unspecified part of right bronchus or lung: Secondary | ICD-10-CM

## 2020-04-29 DIAGNOSIS — Z5189 Encounter for other specified aftercare: Secondary | ICD-10-CM | POA: Insufficient documentation

## 2020-04-29 NOTE — Research (Signed)
A Randomized pragmatic Chair-Based Home Exercise Intervention for Mitigating Cancer-Related Fatigue in Older Adults Undergoing Chemotherapy for Advanced Disease  Patient Riley Sharp was identified by this Research officer, political party as a potential candidate for the above listed study.  This Clinical Research Coordinator met with Riley Sharp, [id number], on 04/29/20 in a manner and location that ensures patient privacy to discuss participation in the above listed research study.  Patient is Accompanied by his wife.  A copy of the informed consent document and separate HIPAA Authorization was provided to the patient.  Patient reads, speaks, and understands [place].   Patient was provided with the business card of this Coordinator and encouraged to contact the research team with any questions.  Approximately 20 minutes were spent with the patient reviewing the informed consent documents.  Patient was provided the option of taking informed consent documents home to review and was encouraged to review at their convenience with their support network, including other care providers. Patient took the consent documents home to review.   Patient states he will call if he has any questions and if he decides to participate.  He states that he wants to receive his first cycle of chemotherapy before deciding if he will participate.  Patient is aware he would need to consent and be registered prior to 2nd cycle of chemotherapy.  He was thanked for his time.  Clabe Seal Clinical Research Coordinator I  04/29/20 2:26 PM

## 2020-05-02 ENCOUNTER — Other Ambulatory Visit: Payer: Self-pay | Admitting: Student

## 2020-05-02 ENCOUNTER — Other Ambulatory Visit (HOSPITAL_COMMUNITY): Payer: Medicare Other

## 2020-05-02 NOTE — Progress Notes (Signed)
Pharmacist Chemotherapy Monitoring - Initial Assessment    Anticipated start date: 05/04/20  Regimen:  . Are orders appropriate based on the patient's diagnosis, regimen, and cycle? Yes . Does the plan date match the patient's scheduled date? Yes . Is the sequencing of drugs appropriate? Yes . Are the premedications appropriate for the patient's regimen? Yes . Prior Authorization for treatment is: Approved o If applicable, is the correct biosimilar selected based on the patient's insurance? yes  Organ Function and Labs: Marland Kitchen Are dose adjustments needed based on the patient's renal function, hepatic function, or hematologic function? No . Are appropriate labs ordered prior to the start of patient's treatment? Yes . Other organ system assessment, if indicated: N/A . The following baseline labs, if indicated, have been ordered: pembrolizumab: baseline TSH +/- T4  Dose Assessment: . Are the drug doses appropriate? Yes . Are the following correct: o Drug concentrations Yes o IV fluid compatible with drug Yes o Administration routes Yes o Timing of therapy Yes . If applicable, does the patient have documented access for treatment and/or plans for port-a-cath placement? no . If applicable, have lifetime cumulative doses been properly documented and assessed? yes Lifetime Dose Tracking  No doses have been documented on this patient for the following tracked chemicals: Doxorubicin, Epirubicin, Idarubicin, Daunorubicin, Mitoxantrone, Bleomycin, Oxaliplatin, Carboplatin, Liposomal Doxorubicin  o   Toxicity Monitoring/Prevention: . The patient has the following take home antiemetics prescribed: Prochlorperazine . The patient has the following take home medications prescribed: N/A . Medication allergies and previous infusion related reactions, if applicable, have been reviewed and addressed. Yes . The patient's current medication list has been assessed for drug-drug interactions with their  chemotherapy regimen. no significant drug-drug interactions were identified on review.  Order Review: . Are the treatment plan orders signed? Yes . Is the patient scheduled to see a provider prior to their treatment? No  I verify that I have reviewed each item in the above checklist and answered each question accordingly.   Kennith Center, Pharm.D., CPP 05/02/2020@11 :23 AM

## 2020-05-03 ENCOUNTER — Other Ambulatory Visit: Payer: Self-pay | Admitting: Physician Assistant

## 2020-05-03 ENCOUNTER — Telehealth: Payer: Self-pay | Admitting: Medical Oncology

## 2020-05-03 ENCOUNTER — Other Ambulatory Visit: Payer: Self-pay

## 2020-05-03 ENCOUNTER — Ambulatory Visit (HOSPITAL_COMMUNITY)
Admission: RE | Admit: 2020-05-03 | Discharge: 2020-05-03 | Disposition: A | Discharge status: Home / Self Care | Payer: Medicare Other | Source: Ambulatory Visit | Attending: Physician Assistant | Admitting: Physician Assistant

## 2020-05-03 DIAGNOSIS — E785 Hyperlipidemia, unspecified: Secondary | ICD-10-CM | POA: Insufficient documentation

## 2020-05-03 DIAGNOSIS — Z87891 Personal history of nicotine dependence: Secondary | ICD-10-CM | POA: Diagnosis not present

## 2020-05-03 DIAGNOSIS — I10 Essential (primary) hypertension: Secondary | ICD-10-CM | POA: Insufficient documentation

## 2020-05-03 DIAGNOSIS — Z7982 Long term (current) use of aspirin: Secondary | ICD-10-CM | POA: Insufficient documentation

## 2020-05-03 DIAGNOSIS — Z79899 Other long term (current) drug therapy: Secondary | ICD-10-CM | POA: Diagnosis not present

## 2020-05-03 DIAGNOSIS — C3490 Malignant neoplasm of unspecified part of unspecified bronchus or lung: Secondary | ICD-10-CM | POA: Diagnosis not present

## 2020-05-03 DIAGNOSIS — I2510 Atherosclerotic heart disease of native coronary artery without angina pectoris: Secondary | ICD-10-CM | POA: Insufficient documentation

## 2020-05-03 DIAGNOSIS — I714 Abdominal aortic aneurysm, without rupture: Secondary | ICD-10-CM | POA: Diagnosis not present

## 2020-05-03 DIAGNOSIS — Z452 Encounter for adjustment and management of vascular access device: Secondary | ICD-10-CM | POA: Diagnosis not present

## 2020-05-03 HISTORY — PX: IR IMAGING GUIDED PORT INSERTION: IMG5740

## 2020-05-03 MED ORDER — HEPARIN SOD (PORK) LOCK FLUSH 100 UNIT/ML IV SOLN
INTRAVENOUS | Status: AC
  Filled 2020-05-03: qty 5

## 2020-05-03 MED ORDER — FENTANYL CITRATE (PF) 100 MCG/2ML IJ SOLN
INTRAMUSCULAR | Status: AC
  Filled 2020-05-03: qty 2

## 2020-05-03 MED ORDER — LIDOCAINE HCL 1 % IJ SOLN
INTRAMUSCULAR | Status: AC
  Filled 2020-05-03: qty 20

## 2020-05-03 MED ORDER — FENTANYL CITRATE (PF) 100 MCG/2ML IJ SOLN
INTRAMUSCULAR | Status: AC | PRN
  Administered 2020-05-03: 50 ug via INTRAVENOUS

## 2020-05-03 MED ORDER — MIDAZOLAM HCL 2 MG/2ML IJ SOLN
INTRAMUSCULAR | Status: AC | PRN
  Administered 2020-05-03: 1 mg via INTRAVENOUS

## 2020-05-03 MED ORDER — MIDAZOLAM HCL 2 MG/2ML IJ SOLN
INTRAMUSCULAR | Status: AC
  Filled 2020-05-03: qty 4

## 2020-05-03 MED ORDER — HEPARIN SOD (PORK) LOCK FLUSH 100 UNIT/ML IV SOLN
INTRAVENOUS | Status: AC | PRN
  Administered 2020-05-03: 500 [IU]

## 2020-05-03 MED ORDER — SODIUM CHLORIDE 0.9 % IV SOLN: INTRAVENOUS | Status: DC

## 2020-05-03 NOTE — H&P (Addendum)
Chief Complaint: Chemotherapy access. Team is requesting portacath placement.  Referring Physician(s): Heilingoetter,Cassandra L  Supervising Physician: Corrie Mckusick   Patient Status: [facility] - Out-pt  History of Present Illness: Riley Sharp is a 75 y.o. male History of AAA s/p stent grade on 10.16.17, CAD, Basal cell carcinoma of the right ear,  HTN, HLD, bilateral non small cell SCC hypermetabolic on PET from 7.59.16. No lines or drains noted on exam.Team is requesting a portacath placement for chemotherapy access.  Currently without any significant complaints. Patient alert and laying in bed, calm and comfortable. Denies any fevers, headache, chest pain, SOB, abdominal pain, nausea, vomiting or bleeding.   Endorses non productive cough in the morning and sinus drainage that he states is allergy related. Upon arrival patient temperature was 100.2. Patient states that he had a tooth extraction yesterday on 4.4.22. He denies any signs of infection. Last recorded temperature 99.9. Patient states that he had his heated seats on in his car en route to the hospital. Okay to proceed per IR Attending. Return precautions and treatment recommendations and follow-up discussed with the patient who is agreeable with the plan.    Past Medical History:  Diagnosis Date  . AAA (abdominal aortic aneurysm) ([place])   . CAD (coronary artery disease)   . Cancer ([place])    basal cell carcimona right ear and upper left at shoulder  . Glaucoma   . Hyperlipidemia   . Hypertension   . Myocardial infarction ([place]) 03/2003  . Wears glasses     Past Surgical History:  Procedure Laterality Date  . ABDOMINAL AORTIC ENDOVASCULAR STENT GRAFT N/A 11/14/2015   Procedure: ABDOMINAL AORTIC ENDOVASCULAR STENT GRAFT;  Surgeon: Elam Dutch, MD;  Location: [place];  Service: Vascular;  Laterality: N/A;  . APPENDECTOMY    . BRONCHIAL BIOPSY  04/18/2020   Procedure: BRONCHIAL BIOPSIES;  Surgeon: [name], DO;   Location: [place] ENDOSCOPY;  Service: Pulmonary;;  . BRONCHIAL BRUSHINGS  04/18/2020   Procedure: BRONCHIAL BRUSHINGS;  Surgeon: [name], DO;  Location: [place] ENDOSCOPY;  Service: Pulmonary;;  . BRONCHIAL NEEDLE ASPIRATION BIOPSY  04/18/2020   Procedure: BRONCHIAL NEEDLE ASPIRATION BIOPSIES;  Surgeon: [name], DO;  Location: [place] ENDOSCOPY;  Service: Pulmonary;;  . BRONCHIAL WASHINGS  04/18/2020   Procedure: BRONCHIAL WASHINGS;  Surgeon: [name], DO;  Location: [place] ENDOSCOPY;  Service: Pulmonary;;  . CHOLECYSTECTOMY  2007   Gall Bladder  . COLONOSCOPY W/ BIOPSIES AND POLYPECTOMY    . EYE SURGERY Bilateral    laser for glaucoma both eyesn x 2  . heart stent  Feb. 14, 2005  . THORACENTESIS Left 04/18/2020   Procedure: THORACENTESIS;  Surgeon: [name], DO;  Location: [facility] ENDOSCOPY;  Service: Pulmonary;  Laterality: Left;  Marland Kitchen VIDEO BRONCHOSCOPY WITH ENDOBRONCHIAL NAVIGATION Bilateral 04/18/2020   Procedure: VIDEO BRONCHOSCOPY WITH ENDOBRONCHIAL NAVIGATION;  Surgeon: [name], DO;  Location: [place];  Service: Pulmonary;  Laterality: Bilateral;    Allergies: Patient has no known allergies.  Medications: Prior to Admission medications   Medication Sig Start Date End Date Taking? Authorizing Provider  aspirin EC 81 MG tablet Take 81 mg by mouth daily.   Yes [provider]  brimonidine (ALPHAGAN) 0.2 % ophthalmic solution Place 1 drop into both eyes 3 (three) times daily.   Yes [provider]  dorzolamide (TRUSOPT) 2 % ophthalmic solution Place 1 drop into both eyes 3 (three) times daily.   Yes [provider]  latanoprost (XALATAN) 0.005 % ophthalmic solution Place 1 drop into both eyes at bedtime.   Yes [provider]  metoprolol succinate (TOPROL-XL) 25 MG 24 hr tablet Take 25 mg by mouth daily.   Yes [provider]  ramipril (ALTACE) 5 MG tablet Take 5 mg by mouth daily.   Yes [provider]   rosuvastatin (CRESTOR) 10 MG tablet Take 10 mg by mouth daily.   Yes [provider]  timolol (TIMOPTIC) 0.5 % ophthalmic solution Place 1 drop into both eyes daily. 02/23/20  Yes [provider]  lidocaine-prilocaine (EMLA) cream Apply 1 application topically as needed. 04/26/20   Heilingoetter, Cassandra L, PA-C  prochlorperazine (COMPAZINE) 10 MG tablet Take 1 tablet (10 mg total) by mouth every 6 (six) hours as needed. 04/26/20   Heilingoetter, Cassandra L, PA-C     Family History  Problem Relation Age of Onset  . Cancer Father   . Heart disease Father        After age 53  . Hyperlipidemia Father   . Hypertension Father   . Heart attack Father   . Heart disease Sister        After age 29  . Hyperlipidemia Sister   . Heart attack Sister   . Hypertension Other   . Heart disease Other   . Cancer Other     Social History   Socioeconomic History  . Marital status: Single    Spouse name: Not on file  . Number of children: Not on file  . Years of education: Not on file  . Highest education level: Not on file  Occupational History  . Not on file  Tobacco Use  . Smoking status: Former Smoker    Packs/day: 2.00    Years: 45.00    Pack years: 90.00    Types: Cigars  . Smokeless tobacco: Never Used  . Tobacco comment: 2-3 cigars per day--quit Jan 2022  Vaping Use  . Vaping Use: Never used  Substance and Sexual Activity  . Alcohol use: Yes    Alcohol/week: 0.0 standard drinks    Comment: rare  . Drug use: No  . Sexual activity: Not on file  Other Topics Concern  . Not on file  Social History Narrative  . Not on file   Social Determinants of Health   Financial Resource Strain: Not on file  Food Insecurity: Not on file  Transportation Needs: Not on file  Physical Activity: Not on file  Stress: Not on file  Social Connections: Not on file    Review of Systems: A 12 point ROS discussed and pertinent positives are indicated in the HPI above.  All  other systems are negative.  Review of Systems  Constitutional: Negative for fever.  HENT: Positive for congestion ( patient coorelats with seasonal allergies.).   Respiratory: Positive for cough ( in the am. Patient coorelates with season allergies.). Negative for shortness of breath. Choking:  productive in the am. patient coorelates with seasonal allergies.   Cardiovascular: Negative for chest pain.  Gastrointestinal: Negative for abdominal pain.  Neurological: Negative for headaches.  Psychiatric/Behavioral: Negative for behavioral problems and confusion.    Vital Signs: BP 128/62   Pulse 87   Temp 99.9 F (37.7 C) (Oral)   Resp 17   Ht 5\' 7"  (1.702 m)   Wt 152 lb (68.9 kg)   SpO2 98%   BMI 23.81 kg/m   Physical Exam Vitals and nursing note reviewed.  Constitutional:  Appearance: He is well-developed.  HENT:     Head: Normocephalic.  Cardiovascular:     Rate and Rhythm: Normal rate and regular rhythm.     Heart sounds: Normal heart sounds.  Pulmonary:     Effort: Pulmonary effort is normal.     Breath sounds: Normal breath sounds.  Musculoskeletal:        General: Normal range of motion.     Cervical back: Normal range of motion.  Skin:    General: Skin is dry.  Neurological:     Mental Status: He is alert and oriented to person, place, and time.     Imaging: MR Brain W Wo Contrast  Result Date: 04/18/2020 CLINICAL DATA:  Lung cancer, follow-up EXAM: MRI HEAD WITHOUT AND WITH CONTRAST TECHNIQUE: Multiplanar, multiecho pulse sequences of the brain and surrounding structures were obtained without and with intravenous contrast. CONTRAST:  69mL GADAVIST GADOBUTROL 1 MMOL/ML IV SOLN COMPARISON:  None. FINDINGS: Brain: There are two punctate foci of cortical diffusion hyperintensity in the left parieto-occipital region. No evidence of intracranial hemorrhage. There is no intracranial mass, mass effect, or edema. There is no hydrocephalus or extra-axial fluid  collection. Prominence of the ventricles and sulci reflects minor generalized parenchymal volume loss. Minimal patchy T2 hyperintensity in the supratentorial white matter is nonspecific may reflect minor chronic microvascular ischemic changes. No abnormal enhancement. Vascular: Major vessel flow voids at the skull base are preserved. Skull and upper cervical spine: Normal marrow signal is preserved. Sinuses/Orbits: Paranasal sinuses are aerated. Orbits are unremarkable. Other: Sella is unremarkable. Mastoid air cells are clear. Small retention cysts along the posterior nasopharyngeal wall. IMPRESSION: Two punctate likely subacute left parietooccipital infarcts. No evidence of intracranial metastatic disease. Electronically Signed   By: Macy Mis M.D.   On: 04/18/2020 08:50   NM PET Image Initial (PI) Skull Base To Thigh  Result Date: 04/20/2020 CLINICAL DATA:  Initial treatment strategy for bilateral upper lobe lung masses. EXAM: NUCLEAR MEDICINE PET SKULL BASE TO THIGH TECHNIQUE: 8.0 mCi F-18 FDG was injected intravenously. Full-ring PET imaging was performed from the skull base to thigh after the radiotracer. CT data was obtained and used for attenuation correction and anatomic localization. Fasting blood glucose: 90 mg/dl COMPARISON:  Chest CT on 03/31/2020 FINDINGS: Mediastinal blood-pool activity (background): SUV max = 2.6 Liver activity (reference): SUV max = N/A NECK:  No hypermetabolic lymph nodes or masses. Incidental CT findings:  None. CHEST: Spiculated mass in the right upper lobe measuring 3.9 cm is hypermetabolic, with SUV max of 11.9. Similar mass in the left upper lobe measures 4.5 cm has also hypermetabolic, with SUV max of 10.5. Nodular hypermetabolic activity is also seen throughout the left pleural space with small pleural effusion, consistent with malignant pleural effusion. Mild lymphadenopathy is seen in the right hilum and right paratracheal region, with SUV max 9.9 and 5.7  respectively. Incidental CT findings:  None. ABDOMEN/PELVIS: No abnormal hypermetabolic activity within the liver, pancreas, adrenal glands, or spleen. No hypermetabolic lymph nodes in the abdomen or pelvis. Incidental CT findings: Prior aortic stent graft repair abdominal aortic aneurysm, with stable size of native aneurysm sac. Diverticulosis is seen mainly involving the descending and sigmoid colon, however there is no evidence of diverticulitis. SKELETON: Several small hypermetabolic bone metastases are seen in the C2 vertebral body, right ribs, L5 vertebral body, and right iliac crest. Incidental CT findings:  None. IMPRESSION: Hypermetabolic bilateral upper lobe pulmonary masses, consistent with synchronous bronchogenic carcinomas. Mild hypermetabolic right hilar and  paramediastinal lymphadenopathy, consistent with lymph node metastases. Small left pleural effusion with diffuse hypermetabolic activity, consistent with metastatic disease. Hypermetabolic bone metastases in the cervical and lumbar spine, right ribs, and right ilium. Electronically Signed   By: Marlaine Hind M.D.   On: 04/20/2020 15:53   DG CHEST PORT 1 VIEW  Result Date: 04/18/2020 CLINICAL DATA:  Status post bronchoscopy EXAM: PORTABLE CHEST 1 VIEW COMPARISON:  03/30/2020 chest radiograph and CT of 03/31/2020. FINDINGS: Patient rotated left. Normal heart size. Small left pleural effusion with loculation laterally, similar. No right-sided pleural fluid. No pneumothorax. Worsened aeration, with left greater than right airspace disease superimposed upon bilateral upper lobe lung masses. IMPRESSION: No pneumothorax or other acute complication after bronchoscopy. Similar loculated left-sided pleural effusion. Bilateral upper lobe lung masses. Worsened aeration with left greater than right superimposed airspace disease. Consider infection. Electronically Signed   By: Abigail Miyamoto M.D.   On: 04/18/2020 15:15   VAS [place] ABI WITH/WO TBI  Result  Date: 04/15/2020 LOWER EXTREMITY DOPPLER STUDY Indications: Peripheral artery disease. Other Factors: Occasional left hip and buttock pain with walking.  Vascular Interventions: EVAR 11/14/15. Comparison Study: No previous ABI. EVAR exam on 04/09/19 suggested stenosis at                   the distal left limb. Performing Technologist: Ralene Cork RVT  Examination Guidelines: A complete evaluation includes at minimum, Doppler waveform signals and systolic blood pressure reading at the level of bilateral brachial, anterior tibial, and posterior tibial arteries, when vessel segments are accessible. Bilateral testing is considered an integral part of a complete examination. Photoelectric Plethysmograph (PPG) waveforms and toe systolic pressure readings are included as required and additional duplex testing as needed. Limited examinations for reoccurring indications may be performed as noted.  ABI Findings: +---------+------------------+-----+--------+--------+ Right    Rt Pressure (mmHg)IndexWaveformComment  +---------+------------------+-----+--------+--------+ Brachial 120                                     +---------+------------------+-----+--------+--------+ PTA      132               1.10 biphasic         +---------+------------------+-----+--------+--------+ DP       0                 0.00 absent           +---------+------------------+-----+--------+--------+ Great Toe74                0.62                  +---------+------------------+-----+--------+--------+ +---------+------------------+-----+--------+-------+ Left     Lt Pressure (mmHg)IndexWaveformComment +---------+------------------+-----+--------+-------+ Brachial 120                                    +---------+------------------+-----+--------+-------+ PTA      130               1.08 biphasic        +---------+------------------+-----+--------+-------+ DP       0                 0.00 absent           +---------+------------------+-----+--------+-------+ Great Toe77                0.64                 +---------+------------------+-----+--------+-------+ +-------+-----------+-----------+------------+------------+  ABI/TBIToday's ABIToday's TBIPrevious ABIPrevious TBI +-------+-----------+-----------+------------+------------+ Right  1.1        0.62                                +-------+-----------+-----------+------------+------------+ Left   1.08       0.64                                +-------+-----------+-----------+------------+------------+  No previous ABI.  Summary: Right: Resting right ankle-brachial index is within normal range. No evidence of significant right lower extremity arterial disease. The right toe-brachial index is abnormal. RT great toe pressure = 74 mmHg. Left: Resting left ankle-brachial index is within normal range. No evidence of significant left lower extremity arterial disease. The left toe-brachial index is abnormal. LT Great toe pressure = 77 mmHg.  *See table(s) above for measurements and observations.  Electronically signed by Servando Snare MD on 04/15/2020 at 9:59:04 AM.    Final    VAS [place] EVAR DUPLEX  Result Date: 04/15/2020 ABDOMINAL AORTA STUDY Indications: Follow up exam for EVAR. Surgery date EVAR 11/14/15.  Comparison Study: 04/09/19: 4.81 x 5.27 cm Patent endovascular aneurysm repair                   with no evidence of endoleak. Performing Technologist: Ralene Cork RVT  Examination Guidelines: A complete evaluation includes B-mode imaging, spectral Doppler, color Doppler, and power Doppler as needed of all accessible portions of each vessel. Bilateral testing is considered an integral part of a complete examination. Limited examinations for reoccurring indications may be performed as noted.  Endovascular Aortic Repair (EVAR): +----------+----------------+-------------------+-------------------+           Diameter AP (cm)Diameter Trans  (cm)Velocities (cm/sec) +----------+----------------+-------------------+-------------------+ Aorta     4.73            5.26               24                  +----------+----------------+-------------------+-------------------+ Right Limb1.65            1.63               33                  +----------+----------------+-------------------+-------------------+ Left Limb 1.90            1.71               33                  +----------+----------------+-------------------+-------------------+  Summary: Abdominal Aorta: Patent endovascular aneurysm repair with no evidence of endoleak.  *See table(s) above for measurements and observations.  Electronically signed by Servando Snare MD on 04/15/2020 at 10:01:01 AM.   Final    DG C-ARM BRONCHOSCOPY  Result Date: 04/18/2020 C-ARM BRONCHOSCOPY: Fluoroscopy was utilized by the requesting physician.  No radiographic interpretation.    Labs:  CBC: Recent Labs    04/07/20 0925  WBC 11.5*  HGB 14.4  HCT 44.7  PLT 224    COAGS: No results for input(s): INR, APTT in the last 8760 hours.  BMP: Recent Labs    04/07/20 0925  NA 138  K 5.3*  CL 105  CO2 25  GLUCOSE 100*  BUN 12  CALCIUM 9.7  CREATININE 1.02  GFRNONAA >60  LIVER FUNCTION TESTS: Recent Labs    04/07/20 0925  BILITOT 0.4  AST 12*  ALT 13  ALKPHOS 84  PROT 7.7  ALBUMIN 3.4*     Assessment and Plan:  75 y.o. male outpatient. History of AAA s/p stent grade on 10.16.17, CAD, Basal cell carcinoma of the right ear,  HTN ,HLD, bilateral non small cell SCC  hypermetabolic on PET from 3.89.37. No lines or drains noted on exam. Team is requesting a portacath placement for chemotherapy access.  All labs and medications are within acceptable parameters. NKDA.  Patient has been NPO since midnight.  Risks and benefits of image guided port-a-catheter placement was discussed with the patient including, but not limited to bleeding, infection, pneumothorax, or  fibrin sheath development and need for additional procedures.  All of the patient's questions were answered, patient is agreeable to proceed. Consent signed and in chart.    Thank you for this interesting consult.  I greatly enjoyed meeting Riley Sharp and look forward to participating in their care.  A copy of this report was sent to the requesting provider on this date.  Electronically Signed: Jacqualine Mau, NP 05/03/2020, 7:43 AM   I spent a total of  30 Minutes   in face to face in clinical consultation, greater than 50% of which was counseling/coordinating care for portacath placement

## 2020-05-03 NOTE — Procedures (Signed)
Interventional Radiology Procedure Note  Procedure: Placement of a right IJ approach single lumen PowerPort.  Tip is positioned at the superior cavoatrial junction and catheter is ready for immediate use.  Complications: None Recommendations:  - Ok to shower tomorrow - Do not submerge for 7 days - Routine line care   Signed,  Desirai Traxler S. Joie Reamer, DO   

## 2020-05-03 NOTE — Sedation Documentation (Signed)
[facility] called to [employer], Therapist, sports.

## 2020-05-03 NOTE — Telephone Encounter (Signed)
Pt asking if he can continue to take his aspirin 81 mg /day. I told him yes.

## 2020-05-03 NOTE — Discharge Instructions (Addendum)
Implanted Port Insertion, Care After This sheet gives you information about how to care for yourself after your procedure. Your health care provider may also give you more specific instructions. If you have problems or questions, contact your health care provider. What can I expect after the procedure? After the procedure, it is common to have:  Discomfort at the port insertion site.  Bruising on the skin over the port. This should improve over 3-4 days. Follow these instructions at home: [facility] care  After your port is placed, you will get a manufacturer's information card. The card has information about your port. Keep this card with you at all times.  Take care of the port as told by your health care provider. Ask your health care provider if you or a family member can get training for taking care of the port at home. A home health care nurse may also take care of the port.  Make sure to remember what type of port you have. Incision care  Follow instructions from your health care provider about how to take care of your port insertion site. Make sure you: ? Wash your hands with soap and water before and after you change your bandage (dressing). If soap and water are not available, use hand sanitizer. ? Change your dressing as told by your health care provider. ? Leave stitches (sutures), skin glue, or adhesive strips in place. These skin closures may need to stay in place for 2 weeks or longer. If adhesive strip edges start to loosen and curl up, you may trim the loose edges. Do not remove adhesive strips completely unless your health care provider tells you to do that.  Check your port insertion site every day for signs of infection. Check for: ? Redness, swelling, or pain. ? Fluid or blood. ? Warmth. ? Pus or a bad smell.      Activity  Return to your normal activities as told by your health care provider. Ask your health care provider what activities are safe for you.  Do not  lift anything that is heavier than 10 lb (4.5 kg), or the limit that you are told, until your health care provider says that it is safe. General instructions  Take over-the-counter and prescription medicines only as told by your health care provider.  Do not take baths, swim, or use a hot tub until your health care provider approves. Ask your health care provider if you may take showers. You may only be allowed to take sponge baths.  Do not drive for 24 hours if you were given a sedative during your procedure.  Wear a medical alert bracelet in case of an emergency. This will tell any health care providers that you have a port.  Keep all follow-up visits as told by your health care provider. This is important. Contact a health care provider if:  You cannot flush your port with saline as directed, or you cannot draw blood from the port.  You have a fever or chills.  You have redness, swelling, or pain around your port insertion site.  You have fluid or blood coming from your port insertion site.  Your port insertion site feels warm to the touch.  You have pus or a bad smell coming from the port insertion site. Get help right away if:  You have chest pain or shortness of breath.  You have bleeding from your port that you cannot control. Summary  Take care of the port as told by your  health care provider. Keep the manufacturer's information card with you at all times.  Change your dressing as told by your health care provider.  Contact a health care provider if you have a fever or chills or if you have redness, swelling, or pain around your port insertion site.  Keep all follow-up visits as told by your health care provider. This information is not intended to replace advice given to you by your health care provider. Make sure you discuss any questions you have with your health care provider. Document Revised: 08/13/2017 Document Reviewed: 08/13/2017 Elsevier Patient Education   2021 [place]. Moderate Conscious Sedation, Adult Sedation is the use of medicines to promote relaxation and to relieve discomfort and anxiety. Moderate conscious sedation is a type of sedation. Under moderate conscious sedation, you are less alert than normal, but you are still able to respond to instructions, touch, or both. Moderate conscious sedation is used during short medical and dental procedures. It is milder than deep sedation, which is a type of sedation under which you cannot be easily woken up. It is also milder than general anesthesia, which is the use of medicines to make you unconscious. Moderate conscious sedation allows you to return to your regular activities sooner. Tell a health care provider about:  Any allergies you have.  All medicines you are taking, including vitamins, herbs, eye drops, creams, and over-the-counter medicines.  Any use of steroids. This includes steroids taken by mouth or as a cream.  Any problems you or family members have had with sedatives and anesthetic medicines.  Any blood disorders you have.  Any surgeries you have had.  Any medical conditions you have, such as sleep apnea.  Whether you are pregnant or may be pregnant.  Any use of cigarettes, alcohol, marijuana, or drugs. What are the risks? Generally, this is a safe procedure. However, problems may occur, including:  Getting too much medicine (oversedation).  Nausea.  Allergic reaction to medicines.  Trouble breathing. If this happens, a breathing tube may be used. It will be removed when you are awake and breathing on your own.  Heart trouble.  Lung trouble.  Confusion that gets better with time (emergence delirium). What happens before the procedure? Staying hydrated Follow instructions from your health care provider about hydration, which may include:  Up to 2 hours before the procedure - you may continue to drink clear liquids, such as water, clear fruit juice, black  coffee, and plain tea. Eating and drinking restrictions Follow instructions from your health care provider about eating and drinking, which may include:  8 hours before the procedure - stop eating heavy meals or foods, such as meat, fried foods, or fatty foods.  6 hours before the procedure - stop eating light meals or foods, such as toast or cereal.  6 hours before the procedure - stop drinking milk or drinks that contain milk.  2 hours before the procedure - stop drinking clear liquids. Medicines Ask your health care provider about:  Changing or stopping your regular medicines. This is especially important if you are taking diabetes medicines or blood thinners.  Taking medicines such as aspirin and ibuprofen. These medicines can thin your blood. Do not take these medicines unless your health care provider tells you to take them.  Taking over-the-counter medicines, vitamins, herbs, and supplements. Tests and exams  You will have a physical exam.  You may have blood tests done to show how well: ? Your kidneys and liver work. ? Your  blood clots. General instructions  Plan to have a responsible adult take you home from the hospital or clinic.  If you will be going home right after the procedure, plan to have a responsible adult care for you for the time you are told. This is important. What happens during the procedure?  You will be given the sedative. The sedative may be given: ? As a pill that you will swallow. It can also be inserted into the rectum. ? As a spray through the nose. ? As an injection into the muscle. ? As an injection into the vein through an IV.  You may be given oxygen as needed.  Your breathing, heart rate, and blood pressure will be monitored during the procedure.  The medical or dental procedure will be done. The procedure may vary among health care providers and hospitals.   What happens after the procedure?  Your blood pressure, heart rate,  breathing rate, and blood oxygen level will be monitored until you leave the hospital or clinic.  You will get fluids through your IV if needed.  Do not drive or operate machinery until your health care provider says that it is safe. Summary  Sedation is the use of medicines to promote relaxation and to relieve discomfort and anxiety. Moderate conscious sedation is a type of sedation that is used during short medical and dental procedures.  Tell the health care provider about any medical conditions that you have and about all the medicines that you are taking.  You will be given the sedative as a pill, a spray through the nose, an injection into the muscle, or an injection into the vein through an IV. Vital signs are monitored during the sedation.  Moderate conscious sedation allows you to return to your regular activities sooner. This information is not intended to replace advice given to you by your health care provider. Make sure you discuss any questions you have with your health care provider. Document Revised: 05/15/2019 Document Reviewed: 12/11/2018 Elsevier Patient Education  2021 [employer].

## 2020-05-03 NOTE — Sedation Documentation (Addendum)
Short Stay unavailable to call report per [facility]. Will call back.

## 2020-05-04 ENCOUNTER — Inpatient Hospital Stay: Payer: Medicare Other

## 2020-05-04 ENCOUNTER — Ambulatory Visit: Payer: Medicare Other

## 2020-05-04 ENCOUNTER — Other Ambulatory Visit: Payer: Self-pay

## 2020-05-04 VITALS — BP 112/61 | HR 64 | Temp 99.1°F | Resp 16 | Wt 155.8 lb

## 2020-05-04 DIAGNOSIS — C3490 Malignant neoplasm of unspecified part of unspecified bronchus or lung: Secondary | ICD-10-CM

## 2020-05-04 DIAGNOSIS — C3411 Malignant neoplasm of upper lobe, right bronchus or lung: Secondary | ICD-10-CM | POA: Diagnosis not present

## 2020-05-04 DIAGNOSIS — C3412 Malignant neoplasm of upper lobe, left bronchus or lung: Secondary | ICD-10-CM | POA: Diagnosis not present

## 2020-05-04 DIAGNOSIS — Z452 Encounter for adjustment and management of vascular access device: Secondary | ICD-10-CM | POA: Diagnosis not present

## 2020-05-04 DIAGNOSIS — Z79899 Other long term (current) drug therapy: Secondary | ICD-10-CM | POA: Diagnosis not present

## 2020-05-04 DIAGNOSIS — Z5111 Encounter for antineoplastic chemotherapy: Secondary | ICD-10-CM | POA: Diagnosis not present

## 2020-05-04 DIAGNOSIS — R5383 Other fatigue: Secondary | ICD-10-CM

## 2020-05-04 DIAGNOSIS — Z5112 Encounter for antineoplastic immunotherapy: Secondary | ICD-10-CM | POA: Diagnosis not present

## 2020-05-04 DIAGNOSIS — C3491 Malignant neoplasm of unspecified part of right bronchus or lung: Secondary | ICD-10-CM

## 2020-05-04 DIAGNOSIS — Z5189 Encounter for other specified aftercare: Secondary | ICD-10-CM | POA: Diagnosis not present

## 2020-05-04 LAB — CBC WITH DIFFERENTIAL (CANCER CENTER ONLY)
Abs Immature Granulocytes: 0.05 10*3/uL (ref 0.00–0.07)
Basophils Absolute: 0 10*3/uL (ref 0.0–0.1)
Basophils Relative: 0 %
Eosinophils Absolute: 0.1 10*3/uL (ref 0.0–0.5)
Eosinophils Relative: 1 %
HCT: 38.2 % — ABNORMAL LOW (ref 39.0–52.0)
Hemoglobin: 12.1 g/dL — ABNORMAL LOW (ref 13.0–17.0)
Immature Granulocytes: 1 %
Lymphocytes Relative: 7 %
Lymphs Abs: 0.7 10*3/uL (ref 0.7–4.0)
MCH: 29.5 pg (ref 26.0–34.0)
MCHC: 31.7 g/dL (ref 30.0–36.0)
MCV: 93.2 fL (ref 80.0–100.0)
Monocytes Absolute: 0.8 10*3/uL (ref 0.1–1.0)
Monocytes Relative: 8 %
Neutro Abs: 8.7 10*3/uL — ABNORMAL HIGH (ref 1.7–7.7)
Neutrophils Relative %: 83 %
Platelet Count: 316 10*3/uL (ref 150–400)
RBC: 4.1 MIL/uL — ABNORMAL LOW (ref 4.22–5.81)
RDW: 12.7 % (ref 11.5–15.5)
WBC Count: 10.3 10*3/uL (ref 4.0–10.5)
nRBC: 0 % (ref 0.0–0.2)

## 2020-05-04 LAB — CMP (CANCER CENTER ONLY)
ALT: 13 U/L (ref 0–44)
AST: 13 U/L — ABNORMAL LOW (ref 15–41)
Albumin: 2.7 g/dL — ABNORMAL LOW (ref 3.5–5.0)
Alkaline Phosphatase: 70 U/L (ref 38–126)
Anion gap: 11 (ref 5–15)
BUN: 12 mg/dL (ref 8–23)
CO2: 24 mmol/L (ref 22–32)
Calcium: 8.6 mg/dL — ABNORMAL LOW (ref 8.9–10.3)
Chloride: 102 mmol/L (ref 98–111)
Creatinine: 0.9 mg/dL (ref 0.61–1.24)
GFR, Estimated: >60 mL/min (ref >60)
Glucose, Bld: 109 mg/dL — ABNORMAL HIGH (ref 70–99)
Potassium: 4 mmol/L (ref 3.5–5.1)
Sodium: 137 mmol/L (ref 135–145)
Total Bilirubin: 0.5 mg/dL (ref 0.3–1.2)
Total Protein: 7 g/dL (ref 6.5–8.1)

## 2020-05-04 LAB — TSH: TSH: 2.112 u[IU]/mL (ref 0.320–4.118)

## 2020-05-04 MED ORDER — FAMOTIDINE IN NACL 20-0.9 MG/50ML-% IV SOLN
INTRAVENOUS | Status: AC
  Filled 2020-05-04: qty 50

## 2020-05-04 MED ORDER — SODIUM CHLORIDE 0.9 % IV SOLN
175.0000 mg/m2 | Freq: Once | INTRAVENOUS | Status: AC
  Administered 2020-05-04: 318 mg via INTRAVENOUS
  Filled 2020-05-04: qty 53

## 2020-05-04 MED ORDER — SODIUM CHLORIDE 0.9 % IV SOLN
Freq: Once | INTRAVENOUS | Status: AC
Start: 2020-05-04 — End: 2020-05-04
  Filled 2020-05-04: qty 250

## 2020-05-04 MED ORDER — SODIUM CHLORIDE 0.9 % IV SOLN
10.0000 mg | Freq: Once | INTRAVENOUS | Status: AC
  Administered 2020-05-04: 10 mg via INTRAVENOUS
  Filled 2020-05-04: qty 10

## 2020-05-04 MED ORDER — HEPARIN SOD (PORK) LOCK FLUSH 100 UNIT/ML IV SOLN
500.0000 [IU] | Freq: Once | INTRAVENOUS | Status: AC | PRN
  Administered 2020-05-04: 500 [IU]
  Filled 2020-05-04: qty 5

## 2020-05-04 MED ORDER — PALONOSETRON HCL INJECTION 0.25 MG/5ML
0.2500 mg | Freq: Once | INTRAVENOUS | Status: AC
  Administered 2020-05-04: 0.25 mg via INTRAVENOUS

## 2020-05-04 MED ORDER — SODIUM CHLORIDE 0.9 % IV SOLN
200.0000 mg | Freq: Once | INTRAVENOUS | Status: AC
  Administered 2020-05-04: 200 mg via INTRAVENOUS
  Filled 2020-05-04: qty 8

## 2020-05-04 MED ORDER — SODIUM CHLORIDE 0.9 % IV SOLN
434.5000 mg | Freq: Once | INTRAVENOUS | Status: AC
  Administered 2020-05-04: 430 mg via INTRAVENOUS
  Filled 2020-05-04: qty 43

## 2020-05-04 MED ORDER — DIPHENHYDRAMINE HCL 50 MG/ML IJ SOLN
INTRAMUSCULAR | Status: AC
  Filled 2020-05-04: qty 1

## 2020-05-04 MED ORDER — FAMOTIDINE IN NACL 20-0.9 MG/50ML-% IV SOLN
20.0000 mg | Freq: Once | INTRAVENOUS | Status: AC
Start: 2020-05-04 — End: 2020-05-04
  Administered 2020-05-04: 20 mg via INTRAVENOUS

## 2020-05-04 MED ORDER — SODIUM CHLORIDE 0.9 % IV SOLN
150.0000 mg | Freq: Once | INTRAVENOUS | Status: AC
  Administered 2020-05-04: 150 mg via INTRAVENOUS
  Filled 2020-05-04: qty 150

## 2020-05-04 MED ORDER — PALONOSETRON HCL INJECTION 0.25 MG/5ML
INTRAVENOUS | Status: AC
  Filled 2020-05-04: qty 5

## 2020-05-04 MED ORDER — DIPHENHYDRAMINE HCL 50 MG/ML IJ SOLN
50.0000 mg | Freq: Once | INTRAMUSCULAR | Status: AC
  Administered 2020-05-04: 50 mg via INTRAVENOUS

## 2020-05-04 MED ORDER — SODIUM CHLORIDE 0.9% FLUSH
10.0000 mL | INTRAVENOUS | Status: DC | PRN
  Administered 2020-05-04: 10 mL
  Filled 2020-05-04: qty 10

## 2020-05-04 NOTE — Patient Instructions (Signed)
[facility] Discharge Instructions for Patients Receiving Chemotherapy  Today you received the following chemotherapy agents: pembrolizumab, paclitaxel, and carboplatin.  To help prevent nausea and vomiting after your treatment, we encourage you to take your nausea medication as directed.   If you develop nausea and vomiting that is not controlled by your nausea medication, call the clinic.   BELOW ARE SYMPTOMS THAT SHOULD BE REPORTED IMMEDIATELY:  *FEVER GREATER THAN 100.5 F  *CHILLS WITH OR WITHOUT FEVER  NAUSEA AND VOMITING THAT IS NOT CONTROLLED WITH YOUR NAUSEA MEDICATION  *UNUSUAL SHORTNESS OF BREATH  *UNUSUAL BRUISING OR BLEEDING  TENDERNESS IN MOUTH AND THROAT WITH OR WITHOUT PRESENCE OF ULCERS  *URINARY PROBLEMS  *BOWEL PROBLEMS  UNUSUAL RASH Items with * indicate a potential emergency and should be followed up as soon as possible.  Feel free to call the clinic should you have any questions or concerns. The clinic phone number is [phone].  Please show the [facility] at check-in to the Emergency Department and triage nurse.  Pembrolizumab injection What is this medicine? PEMBROLIZUMAB (pem broe liz ue mab) is a monoclonal antibody. It is used to treat certain types of cancer. This medicine may be used for other purposes; ask your health care provider or pharmacist if you have questions. COMMON BRAND NAME(S): Keytruda What should I tell my health care provider before I take this medicine? They need to know if you have any of these conditions:  autoimmune diseases like Crohn's disease, ulcerative colitis, or lupus  have had or planning to have an allogeneic stem cell transplant (uses someone else's stem cells)  history of organ transplant  history of chest radiation  nervous system problems like myasthenia gravis or Guillain-Barre syndrome  an unusual or allergic reaction to pembrolizumab, other medicines, foods, dyes, or  preservatives  pregnant or trying to get pregnant  breast-feeding How should I use this medicine? This medicine is for infusion into a vein. It is given by a health care professional in a hospital or clinic setting. A special MedGuide will be given to you before each treatment. Be sure to read this information carefully each time. Talk to your pediatrician regarding the use of this medicine in children. While this drug may be prescribed for children as young as 6 months for selected conditions, precautions do apply. Overdosage: If you think you have taken too much of this medicine contact a poison control center or emergency room at once. NOTE: This medicine is only for you. Do not share this medicine with others. What if I miss a dose? It is important not to miss your dose. Call your doctor or health care professional if you are unable to keep an appointment. What may interact with this medicine? Interactions have not been studied. This list may not describe all possible interactions. Give your health care provider a list of all the medicines, herbs, non-prescription drugs, or dietary supplements you use. Also tell them if you smoke, drink alcohol, or use illegal drugs. Some items may interact with your medicine. What should I watch for while using this medicine? Your condition will be monitored carefully while you are receiving this medicine. You may need blood work done while you are taking this medicine. Do not become pregnant while taking this medicine or for 4 months after stopping it. Women should inform their doctor if they wish to become pregnant or think they might be pregnant. There is a potential for serious side effects to an unborn  child. Talk to your health care professional or pharmacist for more information. Do not breast-feed an infant while taking this medicine or for 4 months after the last dose. What side effects may I notice from receiving this medicine? Side effects that  you should report to your doctor or health care professional as soon as possible:  allergic reactions like skin rash, itching or hives, swelling of the face, lips, or tongue  bloody or black, tarry  breathing problems  changes in vision  chest pain  chills  confusion  constipation  cough  diarrhea  dizziness or feeling faint or lightheaded  fast or irregular heartbeat  fever  flushing  joint pain  low blood counts - this medicine may decrease the number of white blood cells, red blood cells and platelets. You may be at increased risk for infections and bleeding.  muscle pain  muscle weakness  pain, tingling, numbness in the hands or feet  persistent headache  redness, blistering, peeling or loosening of the skin, including inside the mouth  signs and symptoms of high blood sugar such as dizziness; dry mouth; dry skin; fruity breath; nausea; stomach pain; increased hunger or thirst; increased urination  signs and symptoms of kidney injury like trouble passing urine or change in the amount of urine  signs and symptoms of liver injury like dark urine, light-colored stools, loss of appetite, nausea, right upper belly pain, yellowing of the eyes or skin  sweating  swollen lymph nodes  weight loss Side effects that usually do not require medical attention (report to your doctor or health care professional if they continue or are bothersome):  decreased appetite  hair loss  tiredness This list may not describe all possible side effects. Call your doctor for medical advice about side effects. You may report side effects to FDA at 1-800-FDA-1088. Where should I keep my medicine? This drug is given in a hospital or clinic and will not be stored at home. NOTE: This sheet is a summary. It may not cover all possible information. If you have questions about this medicine, talk to your doctor, pharmacist, or health care provider.  2021 Elsevier/Gold Standard  (2018-12-17 21:44:53)  Paclitaxel injection What is this medicine? PACLITAXEL (PAK li TAX el) is a chemotherapy drug. It targets fast dividing cells, like cancer cells, and causes these cells to die. This medicine is used to treat ovarian cancer, breast cancer, lung cancer, Kaposi's sarcoma, and other cancers. This medicine may be used for other purposes; ask your health care provider or pharmacist if you have questions. COMMON BRAND NAME(S): Onxol, Taxol What should I tell my health care provider before I take this medicine? They need to know if you have any of these conditions:  history of irregular heartbeat  liver disease  low blood counts, like low white cell, platelet, or red cell counts  lung or breathing disease, like asthma  tingling of the fingers or toes, or other nerve disorder  an unusual or allergic reaction to paclitaxel, alcohol, polyoxyethylated castor oil, other chemotherapy, other medicines, foods, dyes, or preservatives  pregnant or trying to get pregnant  breast-feeding How should I use this medicine? This drug is given as an infusion into a vein. It is administered in a hospital or clinic by a specially trained health care professional. Talk to your pediatrician regarding the use of this medicine in children. Special care may be needed. Overdosage: If you think you have taken too much of this medicine contact a poison control  center or emergency room at once. NOTE: This medicine is only for you. Do not share this medicine with others. What if I miss a dose? It is important not to miss your dose. Call your doctor or health care professional if you are unable to keep an appointment. What may interact with this medicine? Do not take this medicine with any of the following medications:  live virus vaccines This medicine may also interact with the following medications:  antiviral medicines for hepatitis, HIV or AIDS  certain antibiotics like erythromycin and  clarithromycin  certain medicines for fungal infections like ketoconazole and itraconazole  certain medicines for seizures like carbamazepine, phenobarbital, phenytoin  gemfibrozil  nefazodone  rifampin  St. Raad's wort This list may not describe all possible interactions. Give your health care provider a list of all the medicines, herbs, non-prescription drugs, or dietary supplements you use. Also tell them if you smoke, drink alcohol, or use illegal drugs. Some items may interact with your medicine. What should I watch for while using this medicine? Your condition will be monitored carefully while you are receiving this medicine. You will need important blood work done while you are taking this medicine. This medicine can cause serious allergic reactions. To reduce your risk you will need to take other medicine(s) before treatment with this medicine. If you experience allergic reactions like skin rash, itching or hives, swelling of the face, lips, or tongue, tell your doctor or health care professional right away. In some cases, you may be given additional medicines to help with side effects. Follow all directions for their use. This drug may make you feel generally unwell. This is not uncommon, as chemotherapy can affect healthy cells as well as cancer cells. Report any side effects. Continue your course of treatment even though you feel ill unless your doctor tells you to stop. Call your doctor or health care professional for advice if you get a fever, chills or sore throat, or other symptoms of a cold or flu. Do not treat yourself. This drug decreases your body's ability to fight infections. Try to avoid being around people who are sick. This medicine may increase your risk to bruise or bleed. Call your doctor or health care professional if you notice any unusual bleeding. Be careful brushing and flossing your teeth or using a toothpick because you may get an infection or bleed more easily.  If you have any dental work done, tell your dentist you are receiving this medicine. Avoid taking products that contain aspirin, acetaminophen, ibuprofen, naproxen, or ketoprofen unless instructed by your doctor. These medicines may hide a fever. Do not become pregnant while taking this medicine. Women should inform their doctor if they wish to become pregnant or think they might be pregnant. There is a potential for serious side effects to an unborn child. Talk to your health care professional or pharmacist for more information. Do not breast-feed an infant while taking this medicine. Men are advised not to father a child while receiving this medicine. This product may contain alcohol. Ask your pharmacist or healthcare provider if this medicine contains alcohol. Be sure to tell all healthcare providers you are taking this medicine. Certain medicines, like metronidazole and disulfiram, can cause an unpleasant reaction when taken with alcohol. The reaction includes flushing, headache, nausea, vomiting, sweating, and increased thirst. The reaction can last from 30 minutes to several hours. What side effects may I notice from receiving this medicine? Side effects that you should report to your doctor  or health care professional as soon as possible:  allergic reactions like skin rash, itching or hives, swelling of the face, lips, or tongue  breathing problems  changes in vision  fast, irregular heartbeat  high or low blood pressure  mouth sores  pain, tingling, numbness in the hands or feet  signs of decreased platelets or bleeding - bruising, pinpoint red spots on the skin, black, tarry stools, blood in the urine  signs of decreased red blood cells - unusually weak or tired, feeling faint or lightheaded, falls  signs of infection - fever or chills, cough, sore throat, pain or difficulty passing urine  signs and symptoms of liver injury like dark yellow or brown urine; general ill feeling or  flu-like symptoms; light-colored stools; loss of appetite; nausea; right upper belly pain; unusually weak or tired; yellowing of the eyes or skin  swelling of the ankles, feet, hands  unusually slow heartbeat Side effects that usually do not require medical attention (report to your doctor or health care professional if they continue or are bothersome):  diarrhea  hair loss  loss of appetite  muscle or joint pain  nausea, vomiting  pain, redness, or irritation at site where injected  tiredness This list may not describe all possible side effects. Call your doctor for medical advice about side effects. You may report side effects to FDA at 1-800-FDA-1088. Where should I keep my medicine? This drug is given in a hospital or clinic and will not be stored at home. NOTE: This sheet is a summary. It may not cover all possible information. If you have questions about this medicine, talk to your doctor, pharmacist, or health care provider.  2021 Elsevier/Gold Standard (2018-12-17 13:37:23)  Carboplatin injection What is this medicine? CARBOPLATIN (KAR boe pla tin) is a chemotherapy drug. It targets fast dividing cells, like cancer cells, and causes these cells to die. This medicine is used to treat ovarian cancer and many other cancers. This medicine may be used for other purposes; ask your health care provider or pharmacist if you have questions. COMMON BRAND NAME(S): Paraplatin What should I tell my health care provider before I take this medicine? They need to know if you have any of these conditions:  blood disorders  hearing problems  kidney disease  recent or ongoing radiation therapy  an unusual or allergic reaction to carboplatin, cisplatin, other chemotherapy, other medicines, foods, dyes, or preservatives  pregnant or trying to get pregnant  breast-feeding How should I use this medicine? This drug is usually given as an infusion into a vein. It is administered in a  hospital or clinic by a specially trained health care professional. Talk to your pediatrician regarding the use of this medicine in children. Special care may be needed. Overdosage: If you think you have taken too much of this medicine contact a poison control center or emergency room at once. NOTE: This medicine is only for you. Do not share this medicine with others. What if I miss a dose? It is important not to miss a dose. Call your doctor or health care professional if you are unable to keep an appointment. What may interact with this medicine?  medicines for seizures  medicines to increase blood counts like filgrastim, pegfilgrastim, sargramostim  some antibiotics like amikacin, gentamicin, neomycin, streptomycin, tobramycin  vaccines Talk to your doctor or health care professional before taking any of these medicines:  acetaminophen  aspirin  ibuprofen  ketoprofen  naproxen This list may not describe all  possible interactions. Give your health care provider a list of all the medicines, herbs, non-prescription drugs, or dietary supplements you use. Also tell them if you smoke, drink alcohol, or use illegal drugs. Some items may interact with your medicine. What should I watch for while using this medicine? Your condition will be monitored carefully while you are receiving this medicine. You will need important blood work done while you are taking this medicine. This drug may make you feel generally unwell. This is not uncommon, as chemotherapy can affect healthy cells as well as cancer cells. Report any side effects. Continue your course of treatment even though you feel ill unless your doctor tells you to stop. In some cases, you may be given additional medicines to help with side effects. Follow all directions for their use. Call your doctor or health care professional for advice if you get a fever, chills or sore throat, or other symptoms of a cold or flu. Do not treat  yourself. This drug decreases your body's ability to fight infections. Try to avoid being around people who are sick. This medicine may increase your risk to bruise or bleed. Call your doctor or health care professional if you notice any unusual bleeding. Be careful brushing and flossing your teeth or using a toothpick because you may get an infection or bleed more easily. If you have any dental work done, tell your dentist you are receiving this medicine. Avoid taking products that contain aspirin, acetaminophen, ibuprofen, naproxen, or ketoprofen unless instructed by your doctor. These medicines may hide a fever. Do not become pregnant while taking this medicine. Women should inform their doctor if they wish to become pregnant or think they might be pregnant. There is a potential for serious side effects to an unborn child. Talk to your health care professional or pharmacist for more information. Do not breast-feed an infant while taking this medicine. What side effects may I notice from receiving this medicine? Side effects that you should report to your doctor or health care professional as soon as possible:  allergic reactions like skin rash, itching or hives, swelling of the face, lips, or tongue  signs of infection - fever or chills, cough, sore throat, pain or difficulty passing urine  signs of decreased platelets or bleeding - bruising, pinpoint red spots on the skin, black, tarry stools, nosebleeds  signs of decreased red blood cells - unusually weak or tired, fainting spells, lightheadedness  breathing problems  changes in hearing  changes in vision  chest pain  high blood pressure  low blood counts - This drug may decrease the number of white blood cells, red blood cells and platelets. You may be at increased risk for infections and bleeding.  nausea and vomiting  pain, swelling, redness or irritation at the injection site  pain, tingling, numbness in the hands or  feet  problems with balance, talking, walking  trouble passing urine or change in the amount of urine Side effects that usually do not require medical attention (report to your doctor or health care professional if they continue or are bothersome):  hair loss  loss of appetite  metallic taste in the mouth or changes in taste This list may not describe all possible side effects. Call your doctor for medical advice about side effects. You may report side effects to FDA at 1-800-FDA-1088. Where should I keep my medicine? This drug is given in a hospital or clinic and will not be stored at home. NOTE: This sheet is  a summary. It may not cover all possible information. If you have questions about this medicine, talk to your doctor, pharmacist, or health care provider.  2021 Elsevier/Gold Standard (2007-04-22 14:38:05)

## 2020-05-05 ENCOUNTER — Telehealth: Payer: Self-pay | Admitting: *Deleted

## 2020-05-06 ENCOUNTER — Other Ambulatory Visit: Payer: Self-pay

## 2020-05-06 ENCOUNTER — Telehealth: Payer: Self-pay | Admitting: Emergency Medicine

## 2020-05-06 ENCOUNTER — Encounter: Payer: Self-pay | Admitting: Internal Medicine

## 2020-05-06 ENCOUNTER — Inpatient Hospital Stay: Payer: Medicare Other

## 2020-05-06 VITALS — BP 142/63 | HR 69

## 2020-05-06 DIAGNOSIS — Z79899 Other long term (current) drug therapy: Secondary | ICD-10-CM | POA: Diagnosis not present

## 2020-05-06 DIAGNOSIS — Z452 Encounter for adjustment and management of vascular access device: Secondary | ICD-10-CM | POA: Diagnosis not present

## 2020-05-06 DIAGNOSIS — Z5112 Encounter for antineoplastic immunotherapy: Secondary | ICD-10-CM | POA: Diagnosis not present

## 2020-05-06 DIAGNOSIS — C3412 Malignant neoplasm of upper lobe, left bronchus or lung: Secondary | ICD-10-CM | POA: Diagnosis not present

## 2020-05-06 DIAGNOSIS — Z5111 Encounter for antineoplastic chemotherapy: Secondary | ICD-10-CM | POA: Diagnosis not present

## 2020-05-06 DIAGNOSIS — C3411 Malignant neoplasm of upper lobe, right bronchus or lung: Secondary | ICD-10-CM | POA: Diagnosis not present

## 2020-05-06 DIAGNOSIS — C3491 Malignant neoplasm of unspecified part of right bronchus or lung: Secondary | ICD-10-CM

## 2020-05-06 MED ORDER — PEGFILGRASTIM-JMDB 6 MG/0.6ML ~~LOC~~ SOSY
PREFILLED_SYRINGE | SUBCUTANEOUS | Status: AC
  Filled 2020-05-06: qty 0.6

## 2020-05-06 MED ORDER — PEGFILGRASTIM-JMDB 6 MG/0.6ML ~~LOC~~ SOSY
6.0000 mg | PREFILLED_SYRINGE | Freq: Once | SUBCUTANEOUS | Status: AC
  Administered 2020-05-06: 6 mg via SUBCUTANEOUS

## 2020-05-06 NOTE — Telephone Encounter (Signed)
A Randomized pragmatic Chair-Based Home Exercise Intervention for Mitigating Cancer-Related Fatigue in Older Adults Undergoing Chemotherapy for Advanced Disease  Called to follow up with patient concerning his interest in this research study.  Patient declined participation.  He was thanked for his time and consideration.  Riley Sharp Clinical Research Coordinator I  05/06/20  1:39 PM

## 2020-05-06 NOTE — Patient Instructions (Signed)
Pegfilgrastim injection What is this medicine? PEGFILGRASTIM (PEG fil gra stim) is a long-acting granulocyte colony-stimulating factor that stimulates the growth of neutrophils, a type of white blood cell important in the body's fight against infection. It is used to reduce the incidence of fever and infection in patients with certain types of cancer who are receiving chemotherapy that affects the bone marrow, and to increase survival after being exposed to high doses of radiation. This medicine may be used for other purposes; ask your health care provider or pharmacist if you have questions. COMMON BRAND NAME(S): Fulphila, Neulasta, Nyvepria, UDENYCA, Ziextenzo What should I tell my health care provider before I take this medicine? They need to know if you have any of these conditions:  kidney disease  latex allergy  ongoing radiation therapy  sickle cell disease  skin reactions to acrylic adhesives (On-Body Injector only)  an unusual or allergic reaction to pegfilgrastim, filgrastim, other medicines, foods, dyes, or preservatives  pregnant or trying to get pregnant  breast-feeding How should I use this medicine? This medicine is for injection under the skin. If you get this medicine at home, you will be taught how to prepare and give the pre-filled syringe or how to use the On-body Injector. Refer to the patient Instructions for Use for detailed instructions. Use exactly as directed. Tell your healthcare provider immediately if you suspect that the On-body Injector may not have performed as intended or if you suspect the use of the On-body Injector resulted in a missed or partial dose. It is important that you put your used needles and syringes in a special sharps container. Do not put them in a trash can. If you do not have a sharps container, call your pharmacist or healthcare provider to get one. Talk to your pediatrician regarding the use of this medicine in children. While this drug  may be prescribed for selected conditions, precautions do apply. Overdosage: If you think you have taken too much of this medicine contact a poison control center or emergency room at once. NOTE: This medicine is only for you. Do not share this medicine with others. What if I miss a dose? It is important not to miss your dose. Call your doctor or health care professional if you miss your dose. If you miss a dose due to an On-body Injector failure or leakage, a new dose should be administered as soon as possible using a single prefilled syringe for manual use. What may interact with this medicine? Interactions have not been studied. This list may not describe all possible interactions. Give your health care provider a list of all the medicines, herbs, non-prescription drugs, or dietary supplements you use. Also tell them if you smoke, drink alcohol, or use illegal drugs. Some items may interact with your medicine. What should I watch for while using this medicine? Your condition will be monitored carefully while you are receiving this medicine. You may need blood work done while you are taking this medicine. Talk to your health care provider about your risk of cancer. You may be more at risk for certain types of cancer if you take this medicine. If you are going to need a MRI, CT scan, or other procedure, tell your doctor that you are using this medicine (On-Body Injector only). What side effects may I notice from receiving this medicine? Side effects that you should report to your doctor or health care professional as soon as possible:  allergic reactions (skin rash, itching or hives, swelling of   the face, lips, or tongue)  back pain  dizziness  fever  pain, redness, or irritation at site where injected  pinpoint red spots on the skin  red or dark-brown urine  shortness of breath or breathing problems  stomach or side pain, or pain at the shoulder  swelling  tiredness  trouble  passing urine or change in the amount of urine  unusual bruising or bleeding Side effects that usually do not require medical attention (report to your doctor or health care professional if they continue or are bothersome):  bone pain  muscle pain This list may not describe all possible side effects. Call your doctor for medical advice about side effects. You may report side effects to FDA at 1-800-FDA-1088. Where should I keep my medicine? Keep out of the reach of children. If you are using this medicine at home, you will be instructed on how to store it. Throw away any unused medicine after the expiration date on the label. NOTE: This sheet is a summary. It may not cover all possible information. If you have questions about this medicine, talk to your doctor, pharmacist, or health care provider.  2021 Elsevier/Gold Standard (2019-02-06 13:20:51)  

## 2020-05-06 NOTE — Progress Notes (Signed)
Met with patient at registration to introduce myself as Financial Resource Specialist and to offer available resources.  Discussed one-time $1000 [employer] grant and qualifications to assist with personal expenses while going through treatment.  Gave him my card if interested in applying and for any additional financial questions or concerns.  

## 2020-05-11 ENCOUNTER — Inpatient Hospital Stay: Payer: Medicare Other

## 2020-05-11 ENCOUNTER — Encounter: Payer: Self-pay | Admitting: Internal Medicine

## 2020-05-11 ENCOUNTER — Other Ambulatory Visit: Payer: Self-pay

## 2020-05-11 ENCOUNTER — Inpatient Hospital Stay (HOSPITAL_BASED_OUTPATIENT_CLINIC_OR_DEPARTMENT_OTHER): Payer: Medicare Other | Admitting: Internal Medicine

## 2020-05-11 VITALS — BP 124/72 | HR 99 | Temp 98.5°F | Resp 19 | Ht 67.0 in | Wt 152.5 lb

## 2020-05-11 DIAGNOSIS — Z79899 Other long term (current) drug therapy: Secondary | ICD-10-CM | POA: Diagnosis not present

## 2020-05-11 DIAGNOSIS — C3411 Malignant neoplasm of upper lobe, right bronchus or lung: Secondary | ICD-10-CM | POA: Diagnosis not present

## 2020-05-11 DIAGNOSIS — C3490 Malignant neoplasm of unspecified part of unspecified bronchus or lung: Secondary | ICD-10-CM

## 2020-05-11 DIAGNOSIS — Z452 Encounter for adjustment and management of vascular access device: Secondary | ICD-10-CM | POA: Diagnosis not present

## 2020-05-11 DIAGNOSIS — Z5111 Encounter for antineoplastic chemotherapy: Secondary | ICD-10-CM

## 2020-05-11 DIAGNOSIS — R5383 Other fatigue: Secondary | ICD-10-CM

## 2020-05-11 DIAGNOSIS — C3491 Malignant neoplasm of unspecified part of right bronchus or lung: Secondary | ICD-10-CM

## 2020-05-11 DIAGNOSIS — Z5112 Encounter for antineoplastic immunotherapy: Secondary | ICD-10-CM | POA: Diagnosis not present

## 2020-05-11 DIAGNOSIS — C3412 Malignant neoplasm of upper lobe, left bronchus or lung: Secondary | ICD-10-CM | POA: Diagnosis not present

## 2020-05-11 LAB — CMP (CANCER CENTER ONLY)
ALT: 15 U/L (ref 0–44)
AST: 21 U/L (ref 15–41)
Albumin: 2.8 g/dL — ABNORMAL LOW (ref 3.5–5.0)
Alkaline Phosphatase: 89 U/L (ref 38–126)
Anion gap: 11 (ref 5–15)
BUN: 15 mg/dL (ref 8–23)
CO2: 22 mmol/L (ref 22–32)
Calcium: 8.7 mg/dL — ABNORMAL LOW (ref 8.9–10.3)
Chloride: 102 mmol/L (ref 98–111)
Creatinine: 0.79 mg/dL (ref 0.61–1.24)
GFR, Estimated: >60 mL/min (ref >60)
Glucose, Bld: 104 mg/dL — ABNORMAL HIGH (ref 70–99)
Potassium: 4.2 mmol/L (ref 3.5–5.1)
Sodium: 135 mmol/L (ref 135–145)
Total Bilirubin: 0.5 mg/dL (ref 0.3–1.2)
Total Protein: 6.6 g/dL (ref 6.5–8.1)

## 2020-05-11 LAB — CBC WITH DIFFERENTIAL (CANCER CENTER ONLY)
Abs Immature Granulocytes: 0.62 10*3/uL — ABNORMAL HIGH (ref 0.00–0.07)
Basophils Absolute: 0.1 10*3/uL (ref 0.0–0.1)
Basophils Relative: 1 %
Eosinophils Absolute: 0.2 10*3/uL (ref 0.0–0.5)
Eosinophils Relative: 3 %
HCT: 37 % — ABNORMAL LOW (ref 39.0–52.0)
Hemoglobin: 11.9 g/dL — ABNORMAL LOW (ref 13.0–17.0)
Immature Granulocytes: 7 %
Lymphocytes Relative: 12 %
Lymphs Abs: 1.1 10*3/uL (ref 0.7–4.0)
MCH: 29 pg (ref 26.0–34.0)
MCHC: 32.2 g/dL (ref 30.0–36.0)
MCV: 90.2 fL (ref 80.0–100.0)
Monocytes Absolute: 1.1 10*3/uL — ABNORMAL HIGH (ref 0.1–1.0)
Monocytes Relative: 12 %
Neutro Abs: 5.4 10*3/uL (ref 1.7–7.7)
Neutrophils Relative %: 65 %
Platelet Count: 150 10*3/uL (ref 150–400)
RBC: 4.1 MIL/uL — ABNORMAL LOW (ref 4.22–5.81)
RDW: 12.5 % (ref 11.5–15.5)
WBC Count: 8.5 10*3/uL (ref 4.0–10.5)
nRBC: 0 % (ref 0.0–0.2)

## 2020-05-11 LAB — TSH: TSH: 2.602 u[IU]/mL (ref 0.320–4.118)

## 2020-05-11 NOTE — Progress Notes (Signed)
    [facility] [phone]   [phone]  OFFICE PROGRESS NOTE  Webb, Carol, MD [address] [place] [address]  DIAGNOSIS: Stage IV non-small cell lung cancer, squamous cell carcinoma.  He presented with a right upper lobe lung mass, left upper lobe lung mass, and a loculated left pleural effusion.  He had mild hypermetabolism in the right hilar and paramediastinal lymphadenopathy.  He had a few small bone lesions on C2, L5, right ribs, and right iliac crest.  He was diagnosed in March 2022  PD-L1: 0%.  PRIOR THERAPY: None  CURRENT THERAPY: Systemic chemotherapy with carboplatin for an AUC of 5, paclitaxel 175 mg/m2, Keytruda 200 mg IV 3 weeks with Neulasta support.  First dose expected on 05/02/2020  INTERVAL HISTORY: Riley Sharp 75 y.o. male returns to the clinic today for follow-up visit accompanied by his significant other.  The patient is feeling fine today except for the fatigue and aching pain after the Neulasta injection.  He also has occasional cough with blood-tinged sputum but no significant chest pain or worsening shortness of breath.  He denied having any fever or chills.  He has no nausea, vomiting, diarrhea or constipation.  He has no headache or visual changes.  He tolerated the first week of his treatment fairly well.  He is here today for evaluation and repeat blood work.  MEDICAL HISTORY: Past Medical History:  Diagnosis Date  . AAA (abdominal aortic aneurysm) (HCC)   . CAD (coronary artery disease)   . Cancer (HCC)    basal cell carcimona right ear and upper left at shoulder  . Glaucoma   . Hyperlipidemia   . Hypertension   . Myocardial infarction (HCC) 03/2003  . Wears glasses     ALLERGIES:  has No Known Allergies.  MEDICATIONS:  Current Outpatient Medications  Medication Sig Dispense Refill  . aspirin EC 81 MG tablet Take 81 mg by mouth daily.    . brimonidine (ALPHAGAN) 0.2 % ophthalmic  solution Place 1 drop into both eyes 3 (three) times daily.    . dorzolamide (TRUSOPT) 2 % ophthalmic solution Place 1 drop into both eyes 3 (three) times daily.    . latanoprost (XALATAN) 0.005 % ophthalmic solution Place 1 drop into both eyes at bedtime.    . lidocaine-prilocaine (EMLA) cream Apply 1 application topically as needed. 30 g 2  . metoprolol succinate (TOPROL-XL) 25 MG 24 hr tablet Take 25 mg by mouth daily.    . prochlorperazine (COMPAZINE) 10 MG tablet Take 1 tablet (10 mg total) by mouth every 6 (six) hours as needed. 30 tablet 2  . ramipril (ALTACE) 5 MG tablet Take 5 mg by mouth daily.    . rosuvastatin (CRESTOR) 10 MG tablet Take 10 mg by mouth daily.    . timolol (TIMOPTIC) 0.5 % ophthalmic solution Place 1 drop into both eyes daily.     No current facility-administered medications for this visit.    SURGICAL HISTORY:  Past Surgical History:  Procedure Laterality Date  . ABDOMINAL AORTIC ENDOVASCULAR STENT GRAFT N/A 11/14/2015   Procedure: ABDOMINAL AORTIC ENDOVASCULAR STENT GRAFT;  Surgeon: Charles E Fields, MD;  Location: [facility] OR;  Service: Vascular;  Laterality: N/A;  . APPENDECTOMY    . BRONCHIAL BIOPSY  04/18/2020   Procedure: BRONCHIAL BIOPSIES;  Surgeon: Icard, Bradley L, DO;  Location: [facility] ENDOSCOPY;  Service: Pulmonary;;  . BRONCHIAL BRUSHINGS  04/18/2020   Procedure: BRONCHIAL BRUSHINGS;  Surgeon:   [name], DO;  Location: [facility];  Service: Pulmonary;;  . BRONCHIAL NEEDLE ASPIRATION BIOPSY  04/18/2020   Procedure: BRONCHIAL NEEDLE ASPIRATION BIOPSIES;  Surgeon: [name], DO;  Location: [facility];  Service: Pulmonary;;  . BRONCHIAL WASHINGS  04/18/2020   Procedure: BRONCHIAL WASHINGS;  Surgeon: [name], DO;  Location: [facility];  Service: Pulmonary;;  . CHOLECYSTECTOMY  2007   Gall Bladder  . COLONOSCOPY W/ BIOPSIES AND POLYPECTOMY    . EYE SURGERY Bilateral    laser for glaucoma both eyesn x 2  . heart stent  Feb. 14, 2005   . IR IMAGING GUIDED PORT INSERTION  05/03/2020  . THORACENTESIS Left 04/18/2020   Procedure: THORACENTESIS;  Surgeon: [name], DO;  Location: [facility];  Service: Pulmonary;  Laterality: Left;  Marland Kitchen VIDEO BRONCHOSCOPY WITH ENDOBRONCHIAL NAVIGATION Bilateral 04/18/2020   Procedure: VIDEO BRONCHOSCOPY WITH ENDOBRONCHIAL NAVIGATION;  Surgeon: [name], DO;  Location: [facility];  Service: Pulmonary;  Laterality: Bilateral;    REVIEW OF SYSTEMS:  A comprehensive review of systems was negative except for: Constitutional: positive for fatigue Respiratory: positive for cough and dyspnea on exertion   PHYSICAL EXAMINATION: General appearance: alert, cooperative, fatigued and no distress Head: Normocephalic, without obvious abnormality, atraumatic Neck: no adenopathy, no JVD, supple, symmetrical, trachea midline and thyroid not enlarged, symmetric, no tenderness/mass/nodules Lymph nodes: Cervical, supraclavicular, and axillary nodes normal. Resp: clear to auscultation bilaterally Back: symmetric, no curvature. ROM normal. No CVA tenderness. Cardio: regular rate and rhythm, S1, S2 normal, no murmur, click, rub or gallop GI: soft, non-tender; bowel sounds normal; no masses,  no organomegaly Extremities: extremities normal, atraumatic, no cyanosis or edema  ECOG PERFORMANCE STATUS: 1 - Symptomatic but completely ambulatory  Blood pressure 124/72, pulse 99, temperature 98.5 F (36.9 C), temperature source Tympanic, resp. rate 19, height 5' 7" (1.702 m), weight 152 lb 8 oz (69.2 kg), SpO2 100 %.  LABORATORY DATA: Lab Results  Component Value Date   WBC 8.5 05/11/2020   HGB 11.9 (L) 05/11/2020   HCT 37.0 (L) 05/11/2020   MCV 90.2 05/11/2020   PLT 150 05/11/2020      Chemistry      Component Value Date/Time   NA 135 05/11/2020 1030   K 4.2 05/11/2020 1030   CL 102 05/11/2020 1030   CO2 22 05/11/2020 1030   BUN 15 05/11/2020 1030   CREATININE 0.79 05/11/2020 1030       Component Value Date/Time   CALCIUM 8.7 (L) 05/11/2020 1030   ALKPHOS 89 05/11/2020 1030   AST 21 05/11/2020 1030   ALT 15 05/11/2020 1030   BILITOT 0.5 05/11/2020 1030       RADIOGRAPHIC STUDIES: MR Brain W Wo Contrast  Result Date: 04/18/2020 CLINICAL DATA:  Lung cancer, follow-up EXAM: MRI HEAD WITHOUT AND WITH CONTRAST TECHNIQUE: Multiplanar, multiecho pulse sequences of the brain and surrounding structures were obtained without and with intravenous contrast. CONTRAST:  84m GADAVIST GADOBUTROL 1 MMOL/ML IV SOLN COMPARISON:  None. FINDINGS: Brain: There are two punctate foci of cortical diffusion hyperintensity in the left parieto-occipital region. No evidence of intracranial hemorrhage. There is no intracranial mass, mass effect, or edema. There is no hydrocephalus or extra-axial fluid collection. Prominence of the ventricles and sulci reflects minor generalized parenchymal volume loss. Minimal patchy T2 hyperintensity in the supratentorial white matter is nonspecific may reflect minor chronic microvascular ischemic changes. No abnormal enhancement. Vascular: Major vessel flow voids at the skull base are preserved. Skull  and upper cervical spine: Normal marrow signal is preserved. Sinuses/Orbits: Paranasal sinuses are aerated. Orbits are unremarkable. Other: Sella is unremarkable. Mastoid air cells are clear. Small retention cysts along the posterior nasopharyngeal wall. IMPRESSION: Two punctate likely subacute left parietooccipital infarcts. No evidence of intracranial metastatic disease. Electronically Signed   By: Praneil  Patel M.D.   On: 04/18/2020 08:50   NM PET Image Initial (PI) Skull Base To Thigh  Result Date: 04/20/2020 CLINICAL DATA:  Initial treatment strategy for bilateral upper lobe lung masses. EXAM: NUCLEAR MEDICINE PET SKULL BASE TO THIGH TECHNIQUE: 8.0 mCi F-18 FDG was injected intravenously. Full-ring PET imaging was performed from the skull base to thigh after the  radiotracer. CT data was obtained and used for attenuation correction and anatomic localization. Fasting blood glucose: 90 mg/dl COMPARISON:  Chest CT on 03/31/2020 FINDINGS: Mediastinal blood-pool activity (background): SUV max = 2.6 Liver activity (reference): SUV max = N/A NECK:  No hypermetabolic lymph nodes or masses. Incidental CT findings:  None. CHEST: Spiculated mass in the right upper lobe measuring 3.9 cm is hypermetabolic, with SUV max of 11.9. Similar mass in the left upper lobe measures 4.5 cm has also hypermetabolic, with SUV max of 10.5. Nodular hypermetabolic activity is also seen throughout the left pleural space with small pleural effusion, consistent with malignant pleural effusion. Mild lymphadenopathy is seen in the right hilum and right paratracheal region, with SUV max 9.9 and 5.7 respectively. Incidental CT findings:  None. ABDOMEN/PELVIS: No abnormal hypermetabolic activity within the liver, pancreas, adrenal glands, or spleen. No hypermetabolic lymph nodes in the abdomen or pelvis. Incidental CT findings: Prior aortic stent graft repair abdominal aortic aneurysm, with stable size of native aneurysm sac. Diverticulosis is seen mainly involving the descending and sigmoid colon, however there is no evidence of diverticulitis. SKELETON: Several small hypermetabolic bone metastases are seen in the C2 vertebral body, right ribs, L5 vertebral body, and right iliac crest. Incidental CT findings:  None. IMPRESSION: Hypermetabolic bilateral upper lobe pulmonary masses, consistent with synchronous bronchogenic carcinomas. Mild hypermetabolic right hilar and paramediastinal lymphadenopathy, consistent with lymph node metastases. Small left pleural effusion with diffuse hypermetabolic activity, consistent with metastatic disease. Hypermetabolic bone metastases in the cervical and lumbar spine, right ribs, and right ilium. Electronically Signed   By: Emileo A Stahl M.D.   On: 04/20/2020 15:53   DG CHEST  PORT 1 VIEW  Result Date: 04/18/2020 CLINICAL DATA:  Status post bronchoscopy EXAM: PORTABLE CHEST 1 VIEW COMPARISON:  03/30/2020 chest radiograph and CT of 03/31/2020. FINDINGS: Patient rotated left. Normal heart size. Small left pleural effusion with loculation laterally, similar. No right-sided pleural fluid. No pneumothorax. Worsened aeration, with left greater than right airspace disease superimposed upon bilateral upper lobe lung masses. IMPRESSION: No pneumothorax or other acute complication after bronchoscopy. Similar loculated left-sided pleural effusion. Bilateral upper lobe lung masses. Worsened aeration with left greater than right superimposed airspace disease. Consider infection. Electronically Signed   By: Kyle  Talbot M.D.   On: 04/18/2020 15:15   VAS US ABI WITH/WO TBI  Result Date: 04/15/2020 LOWER EXTREMITY DOPPLER STUDY Indications: Peripheral artery disease. Other Factors: Occasional left hip and buttock pain with walking.  Vascular Interventions: EVAR 11/14/15. Comparison Study: No previous ABI. EVAR exam on 04/09/19 suggested stenosis at                   the distal left limb. Performing Technologist: Helene Cestone RVT  Examination Guidelines: A complete evaluation includes at minimum, Doppler waveform signals and   systolic blood pressure reading at the level of bilateral brachial, anterior tibial, and posterior tibial arteries, when vessel segments are accessible. Bilateral testing is considered an integral part of a complete examination. Photoelectric Plethysmograph (PPG) waveforms and toe systolic pressure readings are included as required and additional duplex testing as needed. Limited examinations for reoccurring indications may be performed as noted.  ABI Findings: +---------+------------------+-----+--------+--------+ Right    Rt Pressure (mmHg)IndexWaveformComment  +---------+------------------+-----+--------+--------+ Brachial 120                                      +---------+------------------+-----+--------+--------+ PTA      132               1.10 biphasic         +---------+------------------+-----+--------+--------+ DP       0                 0.00 absent           +---------+------------------+-----+--------+--------+ Great Toe74                0.62                  +---------+------------------+-----+--------+--------+ +---------+------------------+-----+--------+-------+ Left     Lt Pressure (mmHg)IndexWaveformComment +---------+------------------+-----+--------+-------+ Brachial 120                                    +---------+------------------+-----+--------+-------+ PTA      130               1.08 biphasic        +---------+------------------+-----+--------+-------+ DP       0                 0.00 absent          +---------+------------------+-----+--------+-------+ Great Toe77                0.64                 +---------+------------------+-----+--------+-------+ +-------+-----------+-----------+------------+------------+ ABI/TBIToday's ABIToday's TBIPrevious ABIPrevious TBI +-------+-----------+-----------+------------+------------+ Right  1.1        0.62                                +-------+-----------+-----------+------------+------------+ Left   1.08       0.64                                +-------+-----------+-----------+------------+------------+  No previous ABI.  Summary: Right: Resting right ankle-brachial index is within normal range. No evidence of significant right lower extremity arterial disease. The right toe-brachial index is abnormal. RT great toe pressure = 74 mmHg. Left: Resting left ankle-brachial index is within normal range. No evidence of significant left lower extremity arterial disease. The left toe-brachial index is abnormal. LT Great toe pressure = 77 mmHg.  *See table(s) above for measurements and observations.  Electronically signed by Brandon Cain MD on 04/15/2020  at 9:59:04 AM.    Final    VAS US EVAR DUPLEX  Result Date: 04/15/2020 ABDOMINAL AORTA STUDY Indications: Follow up exam for EVAR. Surgery date EVAR 11/14/15.  Comparison Study: 04/09/19: 4.81 x 5.27 cm Patent endovascular aneurysm repair                     with no evidence of endoleak. Performing Technologist: Helene Cestone RVT  Examination Guidelines: A complete evaluation includes B-mode imaging, spectral Doppler, color Doppler, and power Doppler as needed of all accessible portions of each vessel. Bilateral testing is considered an integral part of a complete examination. Limited examinations for reoccurring indications may be performed as noted.  Endovascular Aortic Repair (EVAR): +----------+----------------+-------------------+-------------------+           Diameter AP (cm)Diameter Trans (cm)Velocities (cm/sec) +----------+----------------+-------------------+-------------------+ Aorta     4.73            5.26               24                  +----------+----------------+-------------------+-------------------+ Right Limb1.65            1.63               33                  +----------+----------------+-------------------+-------------------+ Left Limb 1.90            1.71               33                  +----------+----------------+-------------------+-------------------+  Summary: Abdominal Aorta: Patent endovascular aneurysm repair with no evidence of endoleak.  *See table(s) above for measurements and observations.  Electronically signed by Brandon Cain MD on 04/15/2020 at 10:01:01 AM.   Final    IR IMAGING GUIDED PORT INSERTION  Result Date: 05/03/2020 INDICATION: 75-year-old male referred for port catheter placement EXAM: IMAGE GUIDED PORT CATHETER PLACEMENT MEDICATIONS: None ANESTHESIA/SEDATION: Moderate (conscious) sedation was employed during this procedure. A total of Versed 1.0 mg and Fentanyl 50 mcg was administered intravenously. Moderate Sedation Time: 21 minutes.  The patient's level of consciousness and vital signs were monitored continuously by radiology nursing throughout the procedure under my direct supervision. FLUOROSCOPY TIME:  Fluoroscopy Time: 0 minutes 6 seconds (0 mGy). COMPLICATIONS: None PROCEDURE: The procedure, risks, benefits, and alternatives were explained to the patient. Questions regarding the procedure were encouraged and answered. The patient understands and consents to the procedure. Ultrasound survey was performed with images stored and sent to PACs. The right neck and chest was prepped with chlorhexidine, and draped in the usual sterile fashion using maximum barrier technique (cap and mask, sterile gown, sterile gloves, large sterile sheet, hand hygiene and cutaneous antiseptic). Local anesthesia was attained by infiltration with 1% lidocaine without epinephrine. Ultrasound demonstrated patency of the right internal jugular vein, and this was documented with an image. Under real-time ultrasound guidance, this vein was accessed with a 21 gauge micropuncture needle and image documentation was performed. A small dermatotomy was made at the access site with an 11 scalpel. A 0.018" wire was advanced into the SVC and used to estimate the length of the internal catheter. The access needle exchanged for a 4F micropuncture vascular sheath. The 0.018" wire was then removed and a 0.035" wire advanced into the IVC. An appropriate location for the subcutaneous reservoir was selected below the clavicle and an incision was made through the skin and underlying soft tissues. The subcutaneous tissues were then dissected using a combination of blunt and sharp surgical technique and a pocket was formed. A single lumen power injectable portacatheter was then tunneled through the subcutaneous tissues from the pocket to the dermatotomy and the port reservoir placed within the subcutaneous pocket. The venous   access site was then serially dilated and a peel away vascular  sheath placed over the wire. The wire was removed and the port catheter advanced into position under fluoroscopic guidance. The catheter tip is positioned in the cavoatrial junction. This was documented with a spot image. The portacatheter was then tested and found to flush and aspirate well. The port was flushed with saline followed by 100 units/mL heparinized saline. The pocket was then closed in two layers using first subdermal inverted interrupted absorbable sutures followed by a running subcuticular suture. The epidermis was then sealed with Dermabond. The dermatotomy at the venous access site was also seal with Dermabond. Patient tolerated the procedure well and remained hemodynamically stable throughout. No complications encountered and no significant blood loss encountered IMPRESSION: Status post right IJ port catheter.  Catheter ready for use. Signed, Dulcy Fanny. Dellia Nims, RPVI [facility] Electronically Signed   By: Corrie Mckusick D.O.   On: 05/03/2020 10:48   DG C-ARM BRONCHOSCOPY  Result Date: 04/18/2020 C-ARM BRONCHOSCOPY: Fluoroscopy was utilized by the requesting physician.  No radiographic interpretation.    ASSESSMENT AND PLAN: This is a very pleasant 75 years old white male recently diagnosed with a stage IV (T2b, N2, M1 C) non-small cell lung cancer, squamous cell carcinoma presented with right upper lobe lung mass, left upper lobe lung mass as well as loculated left pleural effusion and right hilar and paramediastinal lymphadenopathy in addition to bone lesions C2 and L5, right ribs and right iliac crest diagnosed in March 2022 The patient is currently undergoing systemic chemotherapy with carboplatin for AUC of 5, paclitaxel 175 mg/M2 and Keytruda 200 mg IV every 3 weeks with Neulasta support status post 1 week of treatment. He tolerated the first week of the treatment well except for the fatigue and aching pain from the Neulasta  injection.  He also has mild cough with blood-tinged sputum. I recommended for the patient to continue his treatment with chemotherapy as planned. I also advised the patient if he starts coughing a lot of blood to call immediately so we can refer him to radiation oncology for consideration of palliative radiotherapy to the area of concern. He will come back for follow-up visit in 2 weeks for evaluation before starting cycle #2. The patient was advised to call immediately if he has any concerning symptoms in the interval. The patient voices understanding of current disease status and treatment options and is in agreement with the current care plan.  All questions were answered. The patient knows to call the clinic with any problems, questions or concerns. We can certainly see the patient much sooner if necessary.   Disclaimer: This note was dictated with voice recognition software. Similar sounding words can inadvertently be transcribed and may not be corrected upon review.

## 2020-05-18 ENCOUNTER — Other Ambulatory Visit: Payer: Self-pay

## 2020-05-18 ENCOUNTER — Inpatient Hospital Stay: Payer: Medicare Other

## 2020-05-18 DIAGNOSIS — Z5112 Encounter for antineoplastic immunotherapy: Secondary | ICD-10-CM | POA: Diagnosis not present

## 2020-05-18 DIAGNOSIS — C3412 Malignant neoplasm of upper lobe, left bronchus or lung: Secondary | ICD-10-CM | POA: Diagnosis not present

## 2020-05-18 DIAGNOSIS — Z5111 Encounter for antineoplastic chemotherapy: Secondary | ICD-10-CM | POA: Diagnosis not present

## 2020-05-18 DIAGNOSIS — R5383 Other fatigue: Secondary | ICD-10-CM

## 2020-05-18 DIAGNOSIS — Z452 Encounter for adjustment and management of vascular access device: Secondary | ICD-10-CM | POA: Diagnosis not present

## 2020-05-18 DIAGNOSIS — Z79899 Other long term (current) drug therapy: Secondary | ICD-10-CM | POA: Diagnosis not present

## 2020-05-18 DIAGNOSIS — Z95828 Presence of other vascular implants and grafts: Secondary | ICD-10-CM

## 2020-05-18 DIAGNOSIS — C3490 Malignant neoplasm of unspecified part of unspecified bronchus or lung: Secondary | ICD-10-CM

## 2020-05-18 DIAGNOSIS — C3411 Malignant neoplasm of upper lobe, right bronchus or lung: Secondary | ICD-10-CM | POA: Diagnosis not present

## 2020-05-18 DIAGNOSIS — C3491 Malignant neoplasm of unspecified part of right bronchus or lung: Secondary | ICD-10-CM

## 2020-05-18 LAB — CMP (CANCER CENTER ONLY)
ALT: 18 U/L (ref 0–44)
AST: 17 U/L (ref 15–41)
Albumin: 2.9 g/dL — ABNORMAL LOW (ref 3.5–5.0)
Alkaline Phosphatase: 116 U/L (ref 38–126)
Anion gap: 11 (ref 5–15)
BUN: 10 mg/dL (ref 8–23)
CO2: 23 mmol/L (ref 22–32)
Calcium: 8.6 mg/dL — ABNORMAL LOW (ref 8.9–10.3)
Chloride: 106 mmol/L (ref 98–111)
Creatinine: 0.79 mg/dL (ref 0.61–1.24)
GFR, Estimated: >60 mL/min (ref >60)
Glucose, Bld: 87 mg/dL (ref 70–99)
Potassium: 3.9 mmol/L (ref 3.5–5.1)
Sodium: 140 mmol/L (ref 135–145)
Total Bilirubin: 0.3 mg/dL (ref 0.3–1.2)
Total Protein: 6.7 g/dL (ref 6.5–8.1)

## 2020-05-18 LAB — CBC WITH DIFFERENTIAL (CANCER CENTER ONLY)
Abs Immature Granulocytes: 2.91 10*3/uL — ABNORMAL HIGH (ref 0.00–0.07)
Basophils Absolute: 0.1 10*3/uL (ref 0.0–0.1)
Basophils Relative: 0 %
Eosinophils Absolute: 0 10*3/uL (ref 0.0–0.5)
Eosinophils Relative: 0 %
HCT: 33.4 % — ABNORMAL LOW (ref 39.0–52.0)
Hemoglobin: 10.9 g/dL — ABNORMAL LOW (ref 13.0–17.0)
Immature Granulocytes: 10 %
Lymphocytes Relative: 5 %
Lymphs Abs: 1.4 10*3/uL (ref 0.7–4.0)
MCH: 30 pg (ref 26.0–34.0)
MCHC: 32.6 g/dL (ref 30.0–36.0)
MCV: 92 fL (ref 80.0–100.0)
Monocytes Absolute: 1.3 10*3/uL — ABNORMAL HIGH (ref 0.1–1.0)
Monocytes Relative: 4 %
Neutro Abs: 24.9 10*3/uL — ABNORMAL HIGH (ref 1.7–7.7)
Neutrophils Relative %: 81 %
Platelet Count: 189 10*3/uL (ref 150–400)
RBC: 3.63 MIL/uL — ABNORMAL LOW (ref 4.22–5.81)
RDW: 13.8 % (ref 11.5–15.5)
WBC Count: 30.7 10*3/uL — ABNORMAL HIGH (ref 4.0–10.5)
nRBC: 0.3 % — ABNORMAL HIGH (ref 0.0–0.2)

## 2020-05-18 LAB — TSH: TSH: 2.527 u[IU]/mL (ref 0.320–4.118)

## 2020-05-18 MED ORDER — HEPARIN SOD (PORK) LOCK FLUSH 100 UNIT/ML IV SOLN
500.0000 [IU] | Freq: Once | INTRAVENOUS | Status: AC | PRN
  Administered 2020-05-18: 500 [IU]
  Filled 2020-05-18: qty 5

## 2020-05-18 MED ORDER — SODIUM CHLORIDE 0.9% FLUSH
10.0000 mL | INTRAVENOUS | Status: DC | PRN
  Administered 2020-05-18: 10 mL
  Filled 2020-05-18: qty 10

## 2020-05-21 NOTE — Progress Notes (Signed)
[facility] OFFICE PROGRESS NOTE  Riley Small, MD [address] [place] [address]  DIAGNOSIS: Stage IV non-Sharp cell lung cancer, squamous cell carcinoma. He presented with a right upper lobe lung mass, left upper lobe lung mass, and a loculated left pleural effusion. He had mild hypermetabolism in the right hilar and paramediastinal lymphadenopathy. He had a few Sharp bone lesions on C2, L5, right ribs, and right iliac crest. He was diagnosed in March 2022  PD-L1:0%  PRIOR THERAPY: None  CURRENT THERAPY: Systemic chemotherapy with carboplatin for an AUC of 5, paclitaxel 175 mg/m2, Keytruda 200 mg IV 3 weeks with Neulasta support.First dose expected on 05/02/2020. Status post 1 cycle.   INTERVAL HISTORY: Riley Sharp 75 y.o. male returns to the clinic today for a follow-up visit.  The patient is feeling well today without any concerning complaints except for he started having more episodes of hemoptysis. He has clots of blood mixed in with the sputum or pink sputum. He was seen in the past by radiation oncology but at that point in time, he had not had any episodes of hemoptysis in several weeks and the quantity was not significant. The patient is status post his first cycle of systemic chemotherapy tolerating it fairly well.  Today the patient denies any fever, chills, night sweats, weight loss.  He denies any chest pain. His shortness of breath with exertion is "not quite as bad" as it was but he continues to have a cough. He does not take any cough medication for this. He had one episode of nausea and vomiting but none since that time. He denies any diarrhea or constipation.  He denies any rashes or skin changes. The patient is here today for evaluation and repeat blood work before starting cycle #2.   MEDICAL HISTORY: Past Medical History:  Diagnosis Date  . AAA (abdominal aortic aneurysm) ([facility])   . CAD (coronary artery disease)   . Cancer ([place])     basal cell carcimona right ear and upper left at shoulder  . Glaucoma   . Hyperlipidemia   . Hypertension   . Myocardial infarction ([place]) 03/2003  . Wears glasses     ALLERGIES:  has No Known Allergies.  MEDICATIONS:  Current Outpatient Medications  Medication Sig Dispense Refill  . acetaminophen (TYLENOL) 500 MG tablet Take 500 mg by mouth every 6 (six) hours as needed.    Marland Kitchen aspirin EC 81 MG tablet Take 81 mg by mouth daily.    . brimonidine (ALPHAGAN) 0.2 % ophthalmic solution Place 1 drop into both eyes 3 (three) times daily.    . dorzolamide (TRUSOPT) 2 % ophthalmic solution Place 1 drop into both eyes 3 (three) times daily.    Marland Kitchen latanoprost (XALATAN) 0.005 % ophthalmic solution Place 1 drop into both eyes at bedtime.    . lidocaine-prilocaine (EMLA) cream Apply 1 application topically as needed. 30 g 2  . loratadine (CLARITIN) 10 MG tablet Take 10 mg by mouth daily.    . metoprolol succinate (TOPROL-XL) 25 MG 24 hr tablet Take 25 mg by mouth daily.    . prochlorperazine (COMPAZINE) 10 MG tablet Take 1 tablet (10 mg total) by mouth every 6 (six) hours as needed. 30 tablet 2  . ramipril (ALTACE) 5 MG tablet Take 5 mg by mouth daily.    . rosuvastatin (CRESTOR) 10 MG tablet Take 10 mg by mouth daily.    . timolol (TIMOPTIC) 0.5 % ophthalmic solution Place  1 drop into both eyes daily.     No current facility-administered medications for this visit.    SURGICAL HISTORY:  Past Surgical History:  Procedure Laterality Date  . ABDOMINAL AORTIC ENDOVASCULAR STENT GRAFT N/A 11/14/2015   Procedure: ABDOMINAL AORTIC ENDOVASCULAR STENT GRAFT;  Surgeon: Elam Dutch, MD;  Location: [facility];  Service: Vascular;  Laterality: N/A;  . APPENDECTOMY    . BRONCHIAL BIOPSY  04/18/2020   Procedure: BRONCHIAL BIOPSIES;  Surgeon: [name], DO;  Location: [facility];  Service: Pulmonary;;  . BRONCHIAL BRUSHINGS  04/18/2020   Procedure: BRONCHIAL BRUSHINGS;  Surgeon: [name], DO;   Location: [facility];  Service: Pulmonary;;  . BRONCHIAL NEEDLE ASPIRATION BIOPSY  04/18/2020   Procedure: BRONCHIAL NEEDLE ASPIRATION BIOPSIES;  Surgeon: [name], DO;  Location: [facility];  Service: Pulmonary;;  . BRONCHIAL WASHINGS  04/18/2020   Procedure: BRONCHIAL WASHINGS;  Surgeon: [name], DO;  Location: [facility];  Service: Pulmonary;;  . CHOLECYSTECTOMY  2007   Gall Bladder  . COLONOSCOPY W/ BIOPSIES AND POLYPECTOMY    . EYE SURGERY Bilateral    laser for glaucoma both eyesn x 2  . heart stent  Feb. 14, 2005  . IR IMAGING GUIDED PORT INSERTION  05/03/2020  . THORACENTESIS Left 04/18/2020   Procedure: THORACENTESIS;  Surgeon: [name], DO;  Location: [facility];  Service: Pulmonary;  Laterality: Left;  Marland Kitchen VIDEO BRONCHOSCOPY WITH ENDOBRONCHIAL NAVIGATION Bilateral 04/18/2020   Procedure: VIDEO BRONCHOSCOPY WITH ENDOBRONCHIAL NAVIGATION;  Surgeon: [name], DO;  Location: [facility];  Service: Pulmonary;  Laterality: Bilateral;    REVIEW OF SYSTEMS:   Review of Systems  Constitutional:Negative for appetite change,weight loss,chills, fatigue,and fever. HENT: Negative for mouth sores, nosebleeds, sore throat and trouble swallowing.  Eyes: Negative for eye problems and icterus.  Respiratory:Positive for cough, blood tinged sputum,and dyspnea on exertion.  Cardiovascular: Negative for chest pain and leg swelling.  Gastrointestinal:Negative for constipation, diarrhea, nausea and vomiting.  Genitourinary: Negative for bladder incontinence, difficulty urinating, dysuria, frequency and hematuria.  Musculoskeletal: Negative for back pain, gait problem, neck pain and neck stiffness.  Skin: Negative for itching and rash.  Neurological: Negative for dizziness, extremity weakness, gait problem, headaches, light-headedness and seizures.  Hematological: Negative for adenopathy. Does not bruise/bleed easily.  Psychiatric/Behavioral: Negative for  confusion, depression and sleep disturbance. The patient is not nervous/anxious.   PHYSICAL EXAMINATION:  Blood pressure (!) 142/73, pulse 64, temperature 97.6 F (36.4 C), temperature source Tympanic, resp. rate 18, weight 155 lb 9.6 oz (70.6 kg), SpO2 100 %.  ECOG PERFORMANCE STATUS: 1 - Symptomatic but completely ambulatory  Physical Exam  Constitutional: Oriented to person, place, and time and well-developed, well-nourished, and in no distress.  HENT:  Head: Normocephalic and atraumatic.  Mouth/Throat: Oropharynx is clear and moist. No oropharyngeal exudate.  Eyes: Conjunctivae are normal. Right eye exhibits no discharge. Left eye exhibits no discharge. No scleral icterus.  Neck: Normal range of motion. Neck supple.  Cardiovascular: Normal rate, regular rhythm, normal heart sounds and intact distal pulses.  Pulmonary/Chest: Effort normal and breath sounds normal. No respiratory distress. No wheezes. No rales.  Abdominal: Soft. Bowel sounds are normal. Exhibits no distension and no mass. There is no tenderness.  Musculoskeletal: Normal range of motion. Exhibits no edema.  Lymphadenopathy:  No cervical adenopathy.  Neurological: Alert and oriented to person, place, and time. Exhibits normal muscle tone. Gait normal. Coordination normal.  Skin: Skin is warm and dry. No  rash noted. Not diaphoretic. No erythema. No pallor.  Psychiatric: Mood, memory and judgment normal.  Vitals reviewed.  LABORATORY DATA: Lab Results  Component Value Date   WBC 15.2 (H) 05/25/2020   HGB 10.4 (L) 05/25/2020   HCT 31.9 (L) 05/25/2020   MCV 92.2 05/25/2020   PLT 364 05/25/2020      Chemistry      Component Value Date/Time   NA 138 05/25/2020 0946   K 4.2 05/25/2020 0946   CL 105 05/25/2020 0946   CO2 24 05/25/2020 0946   BUN 14 05/25/2020 0946   CREATININE 0.79 05/25/2020 0946      Component Value Date/Time   CALCIUM 8.7 (L) 05/25/2020 0946   ALKPHOS 80 05/25/2020 0946   AST 16  05/25/2020 0946   ALT 14 05/25/2020 0946   BILITOT 0.3 05/25/2020 0946       RADIOGRAPHIC STUDIES:  IR IMAGING GUIDED PORT INSERTION  Result Date: 05/03/2020 INDICATION: 75 year old male referred for port catheter placement EXAM: IMAGE GUIDED PORT CATHETER PLACEMENT MEDICATIONS: None ANESTHESIA/SEDATION: Moderate (conscious) sedation was employed during this procedure. A total of Versed 1.0 mg and Fentanyl 50 mcg was administered intravenously. Moderate Sedation Time: 21 minutes. The patient's level of consciousness and vital signs were monitored continuously by radiology nursing throughout the procedure under my direct supervision. FLUOROSCOPY TIME:  Fluoroscopy Time: 0 minutes 6 seconds (0 mGy). COMPLICATIONS: None PROCEDURE: The procedure, risks, benefits, and alternatives were explained to the patient. Questions regarding the procedure were encouraged and answered. The patient understands and consents to the procedure. Ultrasound survey was performed with images stored and sent to PACs. The right neck and chest was prepped with chlorhexidine, and draped in the usual sterile fashion using maximum barrier technique (cap and mask, sterile gown, sterile gloves, large sterile sheet, hand hygiene and cutaneous antiseptic). Local anesthesia was attained by infiltration with 1% lidocaine without epinephrine. Ultrasound demonstrated patency of the right internal jugular vein, and this was documented with an image. Under real-time ultrasound guidance, this vein was accessed with a 21 gauge micropuncture needle and image documentation was performed. A Sharp dermatotomy was made at the access site with an 11 scalpel. A 0.018" wire was advanced into the SVC and used to estimate the length of the internal catheter. The access needle exchanged for a 69F micropuncture vascular sheath. The 0.018" wire was then removed and a 0.035" wire advanced into the IVC. An appropriate location for the subcutaneous reservoir was  selected below the clavicle and an incision was made through the skin and underlying soft tissues. The subcutaneous tissues were then dissected using a combination of blunt and sharp surgical technique and a pocket was formed. A single lumen power injectable portacatheter was then tunneled through the subcutaneous tissues from the pocket to the dermatotomy and the port reservoir placed within the subcutaneous pocket. The venous access site was then serially dilated and a peel away vascular sheath placed over the wire. The wire was removed and the port catheter advanced into position under fluoroscopic guidance. The catheter tip is positioned in the cavoatrial junction. This was documented with a spot image. The portacatheter was then tested and found to flush and aspirate well. The port was flushed with saline followed by 100 units/mL heparinized saline. The pocket was then closed in two layers using first subdermal inverted interrupted absorbable sutures followed by a running subcuticular suture. The epidermis was then sealed with Dermabond. The dermatotomy at the venous access site was also seal with Dermabond.  Patient tolerated the procedure well and remained hemodynamically stable throughout. No complications encountered and no significant blood loss encountered IMPRESSION: Status post right IJ port catheter.  Catheter ready for use. Signed, Dulcy Fanny. Dellia Nims, RPVI [facility] Radiology Electronically Signed   By: Corrie Mckusick D.O.   On: 05/03/2020 10:48     ASSESSMENT/PLAN:  This is a very pleasant 75 year old Caucasian male diagnosed with stage IV non-Sharp cell lung cancer, squamous cell carcinoma.  The patient presented with a right upper lobe lung mass, left upper lobe lung mass, loculated left pleural effusion, mild hypermetabolism in the right hilar anterior mediastinal lymph nodes, and a few Sharp metastatic bone lesions on C2, L5, right ribs, and  right iliac crest.  He was diagnosed in March 2022.  His PD-L1 expression is 0%.   The patient is currently undergoing systemic chemotherapy with carboplatin for AUC of 5, paclitaxel 175 mg/M2 and Keytruda 200 mg IV every 3 weeks with Neulasta support status post 1 week of treatment. He tolerated the first week of the treatment well except for the fatigue and aching pain from the Neulasta injection.  He also has mild cough with blood-tinged sputum.   Labs were reviewed. Recommend he proceed with cycle #2 today as scheduled.   We will see him back for a follow up visit in 3 weeks for evaluation before starting cycle #2.   I have reached out to radiation oncology to see if they would like to proceed with palliative radiotherapy for the hemoptysis.    The patient was advised to call immediately if he has any concerning symptoms in the interval. The patient voices understanding of current disease status and treatment options and is in agreement with the current care plan. All questions were answered. The patient knows to call the clinic with any problems, questions or concerns. We can certainly see the patient much sooner if necessary   No orders of the defined types were placed in this encounter.    I spent 20-29 minutes in this encounter.   Kaleb Sek L Brynn Reznik, PA-C 05/25/20

## 2020-05-25 ENCOUNTER — Inpatient Hospital Stay: Payer: Medicare Other

## 2020-05-25 ENCOUNTER — Other Ambulatory Visit: Payer: Medicare Other

## 2020-05-25 ENCOUNTER — Inpatient Hospital Stay (HOSPITAL_BASED_OUTPATIENT_CLINIC_OR_DEPARTMENT_OTHER): Payer: Medicare Other | Admitting: Physician Assistant

## 2020-05-25 ENCOUNTER — Other Ambulatory Visit: Payer: Self-pay | Admitting: Internal Medicine

## 2020-05-25 ENCOUNTER — Other Ambulatory Visit: Payer: Self-pay

## 2020-05-25 VITALS — BP 142/73 | HR 64 | Temp 97.6°F | Resp 18 | Wt 155.6 lb

## 2020-05-25 DIAGNOSIS — C3411 Malignant neoplasm of upper lobe, right bronchus or lung: Secondary | ICD-10-CM | POA: Diagnosis not present

## 2020-05-25 DIAGNOSIS — C3491 Malignant neoplasm of unspecified part of right bronchus or lung: Secondary | ICD-10-CM

## 2020-05-25 DIAGNOSIS — Z452 Encounter for adjustment and management of vascular access device: Secondary | ICD-10-CM | POA: Diagnosis not present

## 2020-05-25 DIAGNOSIS — Z5111 Encounter for antineoplastic chemotherapy: Secondary | ICD-10-CM

## 2020-05-25 DIAGNOSIS — R042 Hemoptysis: Secondary | ICD-10-CM | POA: Diagnosis not present

## 2020-05-25 DIAGNOSIS — Z79899 Other long term (current) drug therapy: Secondary | ICD-10-CM | POA: Diagnosis not present

## 2020-05-25 DIAGNOSIS — Z95828 Presence of other vascular implants and grafts: Secondary | ICD-10-CM

## 2020-05-25 DIAGNOSIS — C3412 Malignant neoplasm of upper lobe, left bronchus or lung: Secondary | ICD-10-CM | POA: Diagnosis not present

## 2020-05-25 DIAGNOSIS — Z5112 Encounter for antineoplastic immunotherapy: Secondary | ICD-10-CM | POA: Diagnosis not present

## 2020-05-25 DIAGNOSIS — C3490 Malignant neoplasm of unspecified part of unspecified bronchus or lung: Secondary | ICD-10-CM

## 2020-05-25 LAB — CMP (CANCER CENTER ONLY)
ALT: 14 U/L (ref 0–44)
AST: 16 U/L (ref 15–41)
Albumin: 2.8 g/dL — ABNORMAL LOW (ref 3.5–5.0)
Alkaline Phosphatase: 80 U/L (ref 38–126)
Anion gap: 9 (ref 5–15)
BUN: 14 mg/dL (ref 8–23)
CO2: 24 mmol/L (ref 22–32)
Calcium: 8.7 mg/dL — ABNORMAL LOW (ref 8.9–10.3)
Chloride: 105 mmol/L (ref 98–111)
Creatinine: 0.79 mg/dL (ref 0.61–1.24)
GFR, Estimated: >60 mL/min (ref >60)
Glucose, Bld: 97 mg/dL (ref 70–99)
Potassium: 4.2 mmol/L (ref 3.5–5.1)
Sodium: 138 mmol/L (ref 135–145)
Total Bilirubin: 0.3 mg/dL (ref 0.3–1.2)
Total Protein: 6.7 g/dL (ref 6.5–8.1)

## 2020-05-25 LAB — CBC WITH DIFFERENTIAL (CANCER CENTER ONLY)
Abs Immature Granulocytes: 0.15 10*3/uL — ABNORMAL HIGH (ref 0.00–0.07)
Basophils Absolute: 0.1 10*3/uL (ref 0.0–0.1)
Basophils Relative: 1 %
Eosinophils Absolute: 0 10*3/uL (ref 0.0–0.5)
Eosinophils Relative: 0 %
HCT: 31.9 % — ABNORMAL LOW (ref 39.0–52.0)
Hemoglobin: 10.4 g/dL — ABNORMAL LOW (ref 13.0–17.0)
Immature Granulocytes: 1 %
Lymphocytes Relative: 7 %
Lymphs Abs: 1.1 10*3/uL (ref 0.7–4.0)
MCH: 30.1 pg (ref 26.0–34.0)
MCHC: 32.6 g/dL (ref 30.0–36.0)
MCV: 92.2 fL (ref 80.0–100.0)
Monocytes Absolute: 1.1 10*3/uL — ABNORMAL HIGH (ref 0.1–1.0)
Monocytes Relative: 7 %
Neutro Abs: 12.8 10*3/uL — ABNORMAL HIGH (ref 1.7–7.7)
Neutrophils Relative %: 84 %
Platelet Count: 364 10*3/uL (ref 150–400)
RBC: 3.46 MIL/uL — ABNORMAL LOW (ref 4.22–5.81)
RDW: 14.4 % (ref 11.5–15.5)
WBC Count: 15.2 10*3/uL — ABNORMAL HIGH (ref 4.0–10.5)
nRBC: 0 % (ref 0.0–0.2)

## 2020-05-25 MED ORDER — SODIUM CHLORIDE 0.9 % IV SOLN
10.0000 mg | Freq: Once | INTRAVENOUS | Status: AC
  Administered 2020-05-25: 10 mg via INTRAVENOUS
  Filled 2020-05-25: qty 10

## 2020-05-25 MED ORDER — SODIUM CHLORIDE 0.9 % IV SOLN
175.0000 mg/m2 | Freq: Once | INTRAVENOUS | Status: AC
  Administered 2020-05-25: 318 mg via INTRAVENOUS
  Filled 2020-05-25: qty 53

## 2020-05-25 MED ORDER — DIPHENHYDRAMINE HCL 50 MG/ML IJ SOLN
INTRAMUSCULAR | Status: AC
  Filled 2020-05-25: qty 1

## 2020-05-25 MED ORDER — PALONOSETRON HCL INJECTION 0.25 MG/5ML
0.2500 mg | Freq: Once | INTRAVENOUS | Status: AC
  Administered 2020-05-25: 0.25 mg via INTRAVENOUS

## 2020-05-25 MED ORDER — FAMOTIDINE IN NACL 20-0.9 MG/50ML-% IV SOLN
INTRAVENOUS | Status: AC
  Filled 2020-05-25: qty 50

## 2020-05-25 MED ORDER — HEPARIN SOD (PORK) LOCK FLUSH 100 UNIT/ML IV SOLN
500.0000 [IU] | Freq: Once | INTRAVENOUS | Status: AC | PRN
  Administered 2020-05-25: 500 [IU]
  Filled 2020-05-25: qty 5

## 2020-05-25 MED ORDER — PALONOSETRON HCL INJECTION 0.25 MG/5ML
INTRAVENOUS | Status: AC
  Filled 2020-05-25: qty 5

## 2020-05-25 MED ORDER — DIPHENHYDRAMINE HCL 50 MG/ML IJ SOLN
50.0000 mg | Freq: Once | INTRAMUSCULAR | Status: AC
  Administered 2020-05-25: 50 mg via INTRAVENOUS

## 2020-05-25 MED ORDER — FAMOTIDINE IN NACL 20-0.9 MG/50ML-% IV SOLN
20.0000 mg | Freq: Once | INTRAVENOUS | Status: AC
  Administered 2020-05-25: 20 mg via INTRAVENOUS

## 2020-05-25 MED ORDER — SODIUM CHLORIDE 0.9 % IV SOLN
200.0000 mg | Freq: Once | INTRAVENOUS | Status: AC
  Administered 2020-05-25: 200 mg via INTRAVENOUS
  Filled 2020-05-25: qty 8

## 2020-05-25 MED ORDER — SODIUM CHLORIDE 0.9 % IV SOLN
Freq: Once | INTRAVENOUS | Status: AC
  Filled 2020-05-25: qty 250

## 2020-05-25 MED ORDER — SODIUM CHLORIDE 0.9 % IV SOLN
150.0000 mg | Freq: Once | INTRAVENOUS | Status: AC
  Administered 2020-05-25: 150 mg via INTRAVENOUS
  Filled 2020-05-25: qty 150

## 2020-05-25 MED ORDER — SODIUM CHLORIDE 0.9% FLUSH
10.0000 mL | INTRAVENOUS | Status: DC | PRN
  Administered 2020-05-25: 10 mL
  Filled 2020-05-25: qty 10

## 2020-05-25 MED ORDER — SODIUM CHLORIDE 0.9 % IV SOLN
434.5000 mg | Freq: Once | INTRAVENOUS | Status: AC
  Administered 2020-05-25: 430 mg via INTRAVENOUS
  Filled 2020-05-25: qty 43

## 2020-05-25 NOTE — Patient Instructions (Signed)
[facility] ONCOLOGY    Discharge Instructions: Thank you for choosing [facility] to provide your oncology and hematology care.   If you have a lab appointment with the [facility], please go directly to the [facility] and check in at the registration area.   Wear comfortable clothing and clothing appropriate for easy access to any Portacath or PICC line.   We strive to give you quality time with your provider. You may need to reschedule your appointment if you arrive late (15 or more minutes).  Arriving late affects you and other patients whose appointments are after yours.  Also, if you miss three or more appointments without notifying the office, you may be dismissed from the clinic at the provider's discretion.      For prescription refill requests, have your pharmacy contact our office and allow 72 hours for refills to be completed.    Today you received the following chemotherapy and/or immunotherapy agents: Pembrolizumab (Keytruda), Paclitaxel (Taxol), and Carboplatin      To help prevent nausea and vomiting after your treatment, we encourage you to take your nausea medication as directed.  BELOW ARE SYMPTOMS THAT SHOULD BE REPORTED IMMEDIATELY: . *FEVER GREATER THAN 100.4 F (38 C) OR HIGHER . *CHILLS OR SWEATING . *NAUSEA AND VOMITING THAT IS NOT CONTROLLED WITH YOUR NAUSEA MEDICATION . *UNUSUAL SHORTNESS OF BREATH . *UNUSUAL BRUISING OR BLEEDING . *URINARY PROBLEMS (pain or burning when urinating, or frequent urination) . *BOWEL PROBLEMS (unusual diarrhea, constipation, pain near the anus) . TENDERNESS IN MOUTH AND THROAT WITH OR WITHOUT PRESENCE OF ULCERS (sore throat, sores in mouth, or a toothache) . UNUSUAL RASH, SWELLING OR PAIN  . UNUSUAL VAGINAL DISCHARGE OR ITCHING   Items with * indicate a potential emergency and should be followed up as soon as possible or go to the Emergency Department if any problems should occur.  Please  show the CHEMOTHERAPY ALERT CARD or IMMUNOTHERAPY ALERT CARD at check-in to the Emergency Department and triage nurse.  Should you have questions after your visit or need to cancel or reschedule your appointment, please contact [facility]  Dept: [phone]  and follow the prompts.  Office hours are 8:00 a.m. to 4:30 p.m. Monday - Friday. Please note that voicemails left after 4:00 p.m. may not be returned until the following business day.  We are closed weekends and major holidays. You have access to a nurse at all times for urgent questions. Please call the main number to the clinic Dept: [phone] and follow the prompts.   For any non-urgent questions, you may also contact your provider using MyChart. We now offer e-Visits for anyone 67 and older to request care online for non-urgent symptoms. For details visit [email or web address].   Also download the MyChart app! Go to the app store, search "MyChart", open the app, select [facility], and log in with your MyChart username and password.  Due to Covid, a mask is required upon entering the hospital/clinic. If you do not have a mask, one will be given to you upon arrival. For doctor visits, patients may have 1 support person aged 67 or older with them. For treatment visits, patients cannot have anyone with them due to current Covid guidelines and our immunocompromised population.

## 2020-05-26 ENCOUNTER — Telehealth: Payer: Self-pay | Admitting: Radiation Oncology

## 2020-05-26 NOTE — Telephone Encounter (Signed)
I called the patient and discussed his hemoptysis. His hgb has dropped almost 4 grams since last week, to some degree likely from chemotherapy but he has had 3 days in a row of hemoptysis in the morning. We reviewed the rationale for 15 fxns to the chest to treat his symptoms with palliative intent. He's in agreement to simulate when he comes in next week for a PAC flush. Reservation for sim were placed, and he will call us if he desires to cancel this appt.

## 2020-05-27 ENCOUNTER — Inpatient Hospital Stay: Payer: Medicare Other

## 2020-05-27 ENCOUNTER — Other Ambulatory Visit: Payer: Self-pay

## 2020-05-27 DIAGNOSIS — Z5111 Encounter for antineoplastic chemotherapy: Secondary | ICD-10-CM | POA: Diagnosis not present

## 2020-05-27 DIAGNOSIS — Z95828 Presence of other vascular implants and grafts: Secondary | ICD-10-CM

## 2020-05-27 DIAGNOSIS — C3412 Malignant neoplasm of upper lobe, left bronchus or lung: Secondary | ICD-10-CM | POA: Diagnosis not present

## 2020-05-27 DIAGNOSIS — C3491 Malignant neoplasm of unspecified part of right bronchus or lung: Secondary | ICD-10-CM

## 2020-05-27 DIAGNOSIS — Z452 Encounter for adjustment and management of vascular access device: Secondary | ICD-10-CM | POA: Diagnosis not present

## 2020-05-27 DIAGNOSIS — Z5112 Encounter for antineoplastic immunotherapy: Secondary | ICD-10-CM | POA: Diagnosis not present

## 2020-05-27 DIAGNOSIS — C3411 Malignant neoplasm of upper lobe, right bronchus or lung: Secondary | ICD-10-CM | POA: Diagnosis not present

## 2020-05-27 DIAGNOSIS — Z79899 Other long term (current) drug therapy: Secondary | ICD-10-CM | POA: Diagnosis not present

## 2020-05-27 MED ORDER — PEGFILGRASTIM-JMDB 6 MG/0.6ML ~~LOC~~ SOSY
6.0000 mg | PREFILLED_SYRINGE | Freq: Once | SUBCUTANEOUS | Status: AC
  Administered 2020-05-27: 6 mg via SUBCUTANEOUS

## 2020-05-27 NOTE — Patient Instructions (Signed)
Pegfilgrastim injection What is this medicine? PEGFILGRASTIM (PEG fil gra stim) is a long-acting granulocyte colony-stimulating factor that stimulates the growth of neutrophils, a type of white blood cell important in the body's fight against infection. It is used to reduce the incidence of fever and infection in patients with certain types of cancer who are receiving chemotherapy that affects the bone marrow, and to increase survival after being exposed to high doses of radiation. This medicine may be used for other purposes; ask your health care provider or pharmacist if you have questions. COMMON BRAND NAME(S): Fulphila, Neulasta, Nyvepria, UDENYCA, Ziextenzo What should I tell my health care provider before I take this medicine? They need to know if you have any of these conditions:  kidney disease  latex allergy  ongoing radiation therapy  sickle cell disease  skin reactions to acrylic adhesives (On-Body Injector only)  an unusual or allergic reaction to pegfilgrastim, filgrastim, other medicines, foods, dyes, or preservatives  pregnant or trying to get pregnant  breast-feeding How should I use this medicine? This medicine is for injection under the skin. If you get this medicine at home, you will be taught how to prepare and give the pre-filled syringe or how to use the On-body Injector. Refer to the patient Instructions for Use for detailed instructions. Use exactly as directed. Tell your healthcare provider immediately if you suspect that the On-body Injector may not have performed as intended or if you suspect the use of the On-body Injector resulted in a missed or partial dose. It is important that you put your used needles and syringes in a special sharps container. Do not put them in a trash can. If you do not have a sharps container, call your pharmacist or healthcare provider to get one. Talk to your pediatrician regarding the use of this medicine in children. While this drug  may be prescribed for selected conditions, precautions do apply. Overdosage: If you think you have taken too much of this medicine contact a poison control center or emergency room at once. NOTE: This medicine is only for you. Do not share this medicine with others. What if I miss a dose? It is important not to miss your dose. Call your doctor or health care professional if you miss your dose. If you miss a dose due to an On-body Injector failure or leakage, a new dose should be administered as soon as possible using a single prefilled syringe for manual use. What may interact with this medicine? Interactions have not been studied. This list may not describe all possible interactions. Give your health care provider a list of all the medicines, herbs, non-prescription drugs, or dietary supplements you use. Also tell them if you smoke, drink alcohol, or use illegal drugs. Some items may interact with your medicine. What should I watch for while using this medicine? Your condition will be monitored carefully while you are receiving this medicine. You may need blood work done while you are taking this medicine. Talk to your health care provider about your risk of cancer. You may be more at risk for certain types of cancer if you take this medicine. If you are going to need a MRI, CT scan, or other procedure, tell your doctor that you are using this medicine (On-Body Injector only). What side effects may I notice from receiving this medicine? Side effects that you should report to your doctor or health care professional as soon as possible:  allergic reactions (skin rash, itching or hives, swelling of   the face, lips, or tongue)  back pain  dizziness  fever  pain, redness, or irritation at site where injected  pinpoint red spots on the skin  red or dark-brown urine  shortness of breath or breathing problems  stomach or side pain, or pain at the shoulder  swelling  tiredness  trouble  passing urine or change in the amount of urine  unusual bruising or bleeding Side effects that usually do not require medical attention (report to your doctor or health care professional if they continue or are bothersome):  bone pain  muscle pain This list may not describe all possible side effects. Call your doctor for medical advice about side effects. You may report side effects to FDA at 1-800-FDA-1088. Where should I keep my medicine? Keep out of the reach of children. If you are using this medicine at home, you will be instructed on how to store it. Throw away any unused medicine after the expiration date on the label. NOTE: This sheet is a summary. It may not cover all possible information. If you have questions about this medicine, talk to your doctor, pharmacist, or health care provider.  2021 Elsevier/Gold Standard (2019-02-06 13:20:51)  

## 2020-05-31 ENCOUNTER — Telehealth: Payer: Self-pay | Admitting: Medical Oncology

## 2020-05-31 NOTE — Telephone Encounter (Signed)
Per Dr. Julien Nordmann I told pt to take Tylenol PM 1 tablet HS.

## 2020-05-31 NOTE — Telephone Encounter (Signed)
Nausea/vomiting since Saturday , about  4 episodes. "I vomited a lot -everything in my stomach". He is taking compazine before eating. I told him to keep using the compazine 30 mins before eating and to increase fluids. He did keep down his chicken salad at lunch today.   Restless/insomnia- He said this is his main concern. He wakes up at 0400 every morning . Asking for something to help him sleep.  . Lab appt tomorrow.

## 2020-06-01 ENCOUNTER — Other Ambulatory Visit: Payer: Self-pay | Admitting: Radiation Oncology

## 2020-06-01 ENCOUNTER — Other Ambulatory Visit: Payer: Self-pay

## 2020-06-01 ENCOUNTER — Inpatient Hospital Stay: Payer: Medicare Other

## 2020-06-01 ENCOUNTER — Ambulatory Visit
Admission: RE | Admit: 2020-06-01 | Discharge: 2020-06-01 | Disposition: A | Discharge status: Home / Self Care | Payer: Medicare Other | Source: Ambulatory Visit | Attending: Radiation Oncology | Admitting: Radiation Oncology

## 2020-06-01 ENCOUNTER — Other Ambulatory Visit: Payer: Medicare Other

## 2020-06-01 DIAGNOSIS — Z95828 Presence of other vascular implants and grafts: Secondary | ICD-10-CM

## 2020-06-01 DIAGNOSIS — Z5189 Encounter for other specified aftercare: Secondary | ICD-10-CM | POA: Insufficient documentation

## 2020-06-01 DIAGNOSIS — Z5112 Encounter for antineoplastic immunotherapy: Secondary | ICD-10-CM | POA: Insufficient documentation

## 2020-06-01 DIAGNOSIS — C3411 Malignant neoplasm of upper lobe, right bronchus or lung: Secondary | ICD-10-CM | POA: Insufficient documentation

## 2020-06-01 DIAGNOSIS — Z79899 Other long term (current) drug therapy: Secondary | ICD-10-CM | POA: Insufficient documentation

## 2020-06-01 DIAGNOSIS — Z5111 Encounter for antineoplastic chemotherapy: Secondary | ICD-10-CM | POA: Insufficient documentation

## 2020-06-01 DIAGNOSIS — Z87891 Personal history of nicotine dependence: Secondary | ICD-10-CM | POA: Diagnosis not present

## 2020-06-01 DIAGNOSIS — C3412 Malignant neoplasm of upper lobe, left bronchus or lung: Secondary | ICD-10-CM | POA: Insufficient documentation

## 2020-06-01 DIAGNOSIS — C3490 Malignant neoplasm of unspecified part of unspecified bronchus or lung: Secondary | ICD-10-CM

## 2020-06-01 DIAGNOSIS — Z452 Encounter for adjustment and management of vascular access device: Secondary | ICD-10-CM | POA: Insufficient documentation

## 2020-06-01 DIAGNOSIS — Z510 Encounter for antineoplastic radiation therapy: Secondary | ICD-10-CM | POA: Diagnosis not present

## 2020-06-01 LAB — CMP (CANCER CENTER ONLY)
ALT: 13 U/L (ref 0–44)
AST: 16 U/L (ref 15–41)
Albumin: 3.1 g/dL — ABNORMAL LOW (ref 3.5–5.0)
Alkaline Phosphatase: 114 U/L (ref 38–126)
Anion gap: 11 (ref 5–15)
BUN: 12 mg/dL (ref 8–23)
CO2: 24 mmol/L (ref 22–32)
Calcium: 8.7 mg/dL — ABNORMAL LOW (ref 8.9–10.3)
Chloride: 103 mmol/L (ref 98–111)
Creatinine: 0.78 mg/dL (ref 0.61–1.24)
GFR, Estimated: >60 mL/min (ref >60)
Glucose, Bld: 98 mg/dL (ref 70–99)
Potassium: 3.9 mmol/L (ref 3.5–5.1)
Sodium: 138 mmol/L (ref 135–145)
Total Bilirubin: 0.3 mg/dL (ref 0.3–1.2)
Total Protein: 6.7 g/dL (ref 6.5–8.1)

## 2020-06-01 LAB — CBC WITH DIFFERENTIAL (CANCER CENTER ONLY)
Abs Immature Granulocytes: 0.21 10*3/uL — ABNORMAL HIGH (ref 0.00–0.07)
Basophils Absolute: 0.1 10*3/uL (ref 0.0–0.1)
Basophils Relative: 1 %
Eosinophils Absolute: 0.1 10*3/uL (ref 0.0–0.5)
Eosinophils Relative: 1 %
HCT: 33.2 % — ABNORMAL LOW (ref 39.0–52.0)
Hemoglobin: 10.8 g/dL — ABNORMAL LOW (ref 13.0–17.0)
Immature Granulocytes: 1 %
Lymphocytes Relative: 6 %
Lymphs Abs: 1 10*3/uL (ref 0.7–4.0)
MCH: 29.8 pg (ref 26.0–34.0)
MCHC: 32.5 g/dL (ref 30.0–36.0)
MCV: 91.7 fL (ref 80.0–100.0)
Monocytes Absolute: 0.9 10*3/uL (ref 0.1–1.0)
Monocytes Relative: 5 %
Neutro Abs: 14.9 10*3/uL — ABNORMAL HIGH (ref 1.7–7.7)
Neutrophils Relative %: 86 %
Platelet Count: 196 10*3/uL (ref 150–400)
RBC: 3.62 MIL/uL — ABNORMAL LOW (ref 4.22–5.81)
RDW: 14 % (ref 11.5–15.5)
WBC Count: 17.2 10*3/uL — ABNORMAL HIGH (ref 4.0–10.5)
nRBC: 0 % (ref 0.0–0.2)

## 2020-06-01 MED ORDER — SODIUM CHLORIDE 0.9% FLUSH
10.0000 mL | INTRAVENOUS | Status: DC | PRN
  Administered 2020-06-01: 10 mL via INTRAVENOUS
  Filled 2020-06-01: qty 10

## 2020-06-01 MED ORDER — ONDANSETRON HCL 8 MG PO TABS: 8.0000 mg | ORAL_TABLET | Freq: Three times a day (TID) | ORAL | 2 refills | Status: DC | PRN

## 2020-06-01 NOTE — Patient Instructions (Signed)
Implanted Port Insertion, Care After This sheet gives you information about how to care for yourself after your procedure. Your health care provider may also give you more specific instructions. If you have problems or questions, contact your health care provider. What can I expect after the procedure? After the procedure, it is common to have:  Discomfort at the port insertion site.  Bruising on the skin over the port. This should improve over 3-4 days. Follow these instructions at home: Port care  After your port is placed, you will get a manufacturer's information card. The card has information about your port. Keep this card with you at all times.  Take care of the port as told by your health care provider. Ask your health care provider if you or a family member can get training for taking care of the port at home. A home health care nurse may also take care of the port.  Make sure to remember what type of port you have. Incision care  Follow instructions from your health care provider about how to take care of your port insertion site. Make sure you: ? Wash your hands with soap and water before and after you change your bandage (dressing). If soap and water are not available, use hand sanitizer. ? Change your dressing as told by your health care provider. ? Leave stitches (sutures), skin glue, or adhesive strips in place. These skin closures may need to stay in place for 2 weeks or longer. If adhesive strip edges start to loosen and curl up, you may trim the loose edges. Do not remove adhesive strips completely unless your health care provider tells you to do that.  Check your port insertion site every day for signs of infection. Check for: ? Redness, swelling, or pain. ? Fluid or blood. ? Warmth. ? Pus or a bad smell.      Activity  Return to your normal activities as told by your health care provider. Ask your health care provider what activities are safe for you.  Do not  lift anything that is heavier than 10 lb (4.5 kg), or the limit that you are told, until your health care provider says that it is safe. General instructions  Take over-the-counter and prescription medicines only as told by your health care provider.  Do not take baths, swim, or use a hot tub until your health care provider approves. Ask your health care provider if you may take showers. You may only be allowed to take sponge baths.  Do not drive for 24 hours if you were given a sedative during your procedure.  Wear a medical alert bracelet in case of an emergency. This will tell any health care providers that you have a port.  Keep all follow-up visits as told by your health care provider. This is important. Contact a health care provider if:  You cannot flush your port with saline as directed, or you cannot draw blood from the port.  You have a fever or chills.  You have redness, swelling, or pain around your port insertion site.  You have fluid or blood coming from your port insertion site.  Your port insertion site feels warm to the touch.  You have pus or a bad smell coming from the port insertion site. Get help right away if:  You have chest pain or shortness of breath.  You have bleeding from your port that you cannot control. Summary  Take care of the port as told by your   health care provider. Keep the manufacturer's information card with you at all times.  Change your dressing as told by your health care provider.  Contact a health care provider if you have a fever or chills or if you have redness, swelling, or pain around your port insertion site.  Keep all follow-up visits as told by your health care provider. This information is not intended to replace advice given to you by your health care provider. Make sure you discuss any questions you have with your health care provider. Document Revised: 08/13/2017 Document Reviewed: 08/13/2017 Elsevier Patient Education   2021 Elsevier Inc.  

## 2020-06-01 NOTE — Progress Notes (Signed)
I spoke with the patient about symptoms of nausea unrelieved by compazine. We discussed Zofran and a new rx was sent to his pharmacy after we discussed the side effect profile.

## 2020-06-03 DIAGNOSIS — C3411 Malignant neoplasm of upper lobe, right bronchus or lung: Secondary | ICD-10-CM | POA: Diagnosis not present

## 2020-06-03 DIAGNOSIS — C3412 Malignant neoplasm of upper lobe, left bronchus or lung: Secondary | ICD-10-CM | POA: Diagnosis not present

## 2020-06-03 DIAGNOSIS — Z5112 Encounter for antineoplastic immunotherapy: Secondary | ICD-10-CM | POA: Diagnosis not present

## 2020-06-03 DIAGNOSIS — Z5111 Encounter for antineoplastic chemotherapy: Secondary | ICD-10-CM | POA: Diagnosis not present

## 2020-06-03 DIAGNOSIS — Z510 Encounter for antineoplastic radiation therapy: Secondary | ICD-10-CM | POA: Diagnosis not present

## 2020-06-03 DIAGNOSIS — Z79899 Other long term (current) drug therapy: Secondary | ICD-10-CM | POA: Diagnosis not present

## 2020-06-03 DIAGNOSIS — Z452 Encounter for adjustment and management of vascular access device: Secondary | ICD-10-CM | POA: Diagnosis not present

## 2020-06-03 DIAGNOSIS — Z87891 Personal history of nicotine dependence: Secondary | ICD-10-CM | POA: Diagnosis not present

## 2020-06-06 ENCOUNTER — Ambulatory Visit
Admission: RE | Admit: 2020-06-06 | Discharge: 2020-06-06 | Disposition: A | Discharge status: Home / Self Care | Payer: Medicare Other | Source: Ambulatory Visit | Attending: Radiation Oncology | Admitting: Radiation Oncology

## 2020-06-06 ENCOUNTER — Other Ambulatory Visit: Payer: Self-pay

## 2020-06-06 DIAGNOSIS — C3412 Malignant neoplasm of upper lobe, left bronchus or lung: Secondary | ICD-10-CM | POA: Diagnosis not present

## 2020-06-06 DIAGNOSIS — Z510 Encounter for antineoplastic radiation therapy: Secondary | ICD-10-CM | POA: Diagnosis not present

## 2020-06-06 DIAGNOSIS — Z5112 Encounter for antineoplastic immunotherapy: Secondary | ICD-10-CM | POA: Diagnosis not present

## 2020-06-06 DIAGNOSIS — Z87891 Personal history of nicotine dependence: Secondary | ICD-10-CM | POA: Diagnosis not present

## 2020-06-06 DIAGNOSIS — Z79899 Other long term (current) drug therapy: Secondary | ICD-10-CM | POA: Diagnosis not present

## 2020-06-06 DIAGNOSIS — Z452 Encounter for adjustment and management of vascular access device: Secondary | ICD-10-CM | POA: Diagnosis not present

## 2020-06-06 DIAGNOSIS — C3411 Malignant neoplasm of upper lobe, right bronchus or lung: Secondary | ICD-10-CM | POA: Diagnosis not present

## 2020-06-06 DIAGNOSIS — Z5111 Encounter for antineoplastic chemotherapy: Secondary | ICD-10-CM | POA: Diagnosis not present

## 2020-06-07 ENCOUNTER — Ambulatory Visit
Admission: RE | Admit: 2020-06-07 | Discharge: 2020-06-07 | Disposition: A | Discharge status: Home / Self Care | Payer: Medicare Other | Source: Ambulatory Visit | Attending: Radiation Oncology | Admitting: Radiation Oncology

## 2020-06-07 DIAGNOSIS — Z5112 Encounter for antineoplastic immunotherapy: Secondary | ICD-10-CM | POA: Diagnosis not present

## 2020-06-07 DIAGNOSIS — C3412 Malignant neoplasm of upper lobe, left bronchus or lung: Secondary | ICD-10-CM | POA: Diagnosis not present

## 2020-06-07 DIAGNOSIS — Z87891 Personal history of nicotine dependence: Secondary | ICD-10-CM | POA: Diagnosis not present

## 2020-06-07 DIAGNOSIS — Z79899 Other long term (current) drug therapy: Secondary | ICD-10-CM | POA: Diagnosis not present

## 2020-06-07 DIAGNOSIS — C3411 Malignant neoplasm of upper lobe, right bronchus or lung: Secondary | ICD-10-CM | POA: Diagnosis not present

## 2020-06-07 DIAGNOSIS — Z510 Encounter for antineoplastic radiation therapy: Secondary | ICD-10-CM | POA: Diagnosis not present

## 2020-06-07 DIAGNOSIS — Z452 Encounter for adjustment and management of vascular access device: Secondary | ICD-10-CM | POA: Diagnosis not present

## 2020-06-07 DIAGNOSIS — Z5111 Encounter for antineoplastic chemotherapy: Secondary | ICD-10-CM | POA: Diagnosis not present

## 2020-06-08 ENCOUNTER — Other Ambulatory Visit: Payer: Self-pay

## 2020-06-08 ENCOUNTER — Other Ambulatory Visit: Payer: Medicare Other

## 2020-06-08 ENCOUNTER — Inpatient Hospital Stay: Payer: Medicare Other

## 2020-06-08 ENCOUNTER — Ambulatory Visit
Admission: RE | Admit: 2020-06-08 | Discharge: 2020-06-08 | Disposition: A | Discharge status: Home / Self Care | Payer: Medicare Other | Source: Ambulatory Visit | Attending: Radiation Oncology | Admitting: Radiation Oncology

## 2020-06-08 DIAGNOSIS — Z79899 Other long term (current) drug therapy: Secondary | ICD-10-CM | POA: Diagnosis not present

## 2020-06-08 DIAGNOSIS — R5383 Other fatigue: Secondary | ICD-10-CM

## 2020-06-08 DIAGNOSIS — Z452 Encounter for adjustment and management of vascular access device: Secondary | ICD-10-CM | POA: Diagnosis not present

## 2020-06-08 DIAGNOSIS — C3412 Malignant neoplasm of upper lobe, left bronchus or lung: Secondary | ICD-10-CM | POA: Diagnosis not present

## 2020-06-08 DIAGNOSIS — Z95828 Presence of other vascular implants and grafts: Secondary | ICD-10-CM

## 2020-06-08 DIAGNOSIS — C3490 Malignant neoplasm of unspecified part of unspecified bronchus or lung: Secondary | ICD-10-CM

## 2020-06-08 DIAGNOSIS — Z510 Encounter for antineoplastic radiation therapy: Secondary | ICD-10-CM | POA: Diagnosis not present

## 2020-06-08 DIAGNOSIS — Z5111 Encounter for antineoplastic chemotherapy: Secondary | ICD-10-CM | POA: Diagnosis not present

## 2020-06-08 DIAGNOSIS — Z5112 Encounter for antineoplastic immunotherapy: Secondary | ICD-10-CM | POA: Diagnosis not present

## 2020-06-08 DIAGNOSIS — Z87891 Personal history of nicotine dependence: Secondary | ICD-10-CM | POA: Diagnosis not present

## 2020-06-08 DIAGNOSIS — C3411 Malignant neoplasm of upper lobe, right bronchus or lung: Secondary | ICD-10-CM | POA: Diagnosis not present

## 2020-06-08 LAB — CMP (CANCER CENTER ONLY)
ALT: 13 U/L (ref 0–44)
AST: 18 U/L (ref 15–41)
Albumin: 3.1 g/dL — ABNORMAL LOW (ref 3.5–5.0)
Alkaline Phosphatase: 118 U/L (ref 38–126)
Anion gap: 8 (ref 5–15)
BUN: 13 mg/dL (ref 8–23)
CO2: 25 mmol/L (ref 22–32)
Calcium: 8.9 mg/dL (ref 8.9–10.3)
Chloride: 104 mmol/L (ref 98–111)
Creatinine: 0.87 mg/dL (ref 0.61–1.24)
GFR, Estimated: >60 mL/min (ref >60)
Glucose, Bld: 101 mg/dL — ABNORMAL HIGH (ref 70–99)
Potassium: 4.4 mmol/L (ref 3.5–5.1)
Sodium: 137 mmol/L (ref 135–145)
Total Bilirubin: 0.3 mg/dL (ref 0.3–1.2)
Total Protein: 6.8 g/dL (ref 6.5–8.1)

## 2020-06-08 LAB — CBC WITH DIFFERENTIAL (CANCER CENTER ONLY)
Abs Immature Granulocytes: 0.27 10*3/uL — ABNORMAL HIGH (ref 0.00–0.07)
Basophils Absolute: 0.1 10*3/uL (ref 0.0–0.1)
Basophils Relative: 0 %
Eosinophils Absolute: 0.1 10*3/uL (ref 0.0–0.5)
Eosinophils Relative: 0 %
HCT: 35.4 % — ABNORMAL LOW (ref 39.0–52.0)
Hemoglobin: 11.2 g/dL — ABNORMAL LOW (ref 13.0–17.0)
Immature Granulocytes: 1 %
Lymphocytes Relative: 5 %
Lymphs Abs: 1 10*3/uL (ref 0.7–4.0)
MCH: 29.4 pg (ref 26.0–34.0)
MCHC: 31.6 g/dL (ref 30.0–36.0)
MCV: 92.9 fL (ref 80.0–100.0)
Monocytes Absolute: 0.9 10*3/uL (ref 0.1–1.0)
Monocytes Relative: 4 %
Neutro Abs: 17.5 10*3/uL — ABNORMAL HIGH (ref 1.7–7.7)
Neutrophils Relative %: 90 %
Platelet Count: 189 10*3/uL (ref 150–400)
RBC: 3.81 MIL/uL — ABNORMAL LOW (ref 4.22–5.81)
RDW: 14.8 % (ref 11.5–15.5)
WBC Count: 19.7 10*3/uL — ABNORMAL HIGH (ref 4.0–10.5)
nRBC: 0 % (ref 0.0–0.2)

## 2020-06-08 LAB — TSH: TSH: 2.841 u[IU]/mL (ref 0.320–4.118)

## 2020-06-08 MED ORDER — HEPARIN SOD (PORK) LOCK FLUSH 100 UNIT/ML IV SOLN
500.0000 [IU] | INTRAVENOUS | Status: AC | PRN
  Administered 2020-06-08: 500 [IU]
  Filled 2020-06-08: qty 5

## 2020-06-08 MED ORDER — SODIUM CHLORIDE 0.9% FLUSH
10.0000 mL | INTRAVENOUS | Status: AC | PRN
  Administered 2020-06-08: 10 mL
  Filled 2020-06-08: qty 10

## 2020-06-09 ENCOUNTER — Ambulatory Visit
Admission: RE | Admit: 2020-06-09 | Discharge: 2020-06-09 | Disposition: A | Discharge status: Home / Self Care | Payer: Medicare Other | Source: Ambulatory Visit | Attending: Radiation Oncology | Admitting: Radiation Oncology

## 2020-06-09 DIAGNOSIS — C3412 Malignant neoplasm of upper lobe, left bronchus or lung: Secondary | ICD-10-CM | POA: Diagnosis not present

## 2020-06-09 DIAGNOSIS — Z510 Encounter for antineoplastic radiation therapy: Secondary | ICD-10-CM | POA: Diagnosis not present

## 2020-06-09 DIAGNOSIS — Z5111 Encounter for antineoplastic chemotherapy: Secondary | ICD-10-CM | POA: Diagnosis not present

## 2020-06-09 DIAGNOSIS — Z452 Encounter for adjustment and management of vascular access device: Secondary | ICD-10-CM | POA: Diagnosis not present

## 2020-06-09 DIAGNOSIS — Z5112 Encounter for antineoplastic immunotherapy: Secondary | ICD-10-CM | POA: Diagnosis not present

## 2020-06-09 DIAGNOSIS — Z87891 Personal history of nicotine dependence: Secondary | ICD-10-CM | POA: Diagnosis not present

## 2020-06-09 DIAGNOSIS — C3411 Malignant neoplasm of upper lobe, right bronchus or lung: Secondary | ICD-10-CM | POA: Diagnosis not present

## 2020-06-09 DIAGNOSIS — Z79899 Other long term (current) drug therapy: Secondary | ICD-10-CM | POA: Diagnosis not present

## 2020-06-10 ENCOUNTER — Other Ambulatory Visit: Payer: Self-pay

## 2020-06-10 ENCOUNTER — Ambulatory Visit
Admission: RE | Admit: 2020-06-10 | Discharge: 2020-06-10 | Disposition: A | Discharge status: Home / Self Care | Payer: Medicare Other | Source: Ambulatory Visit | Attending: Radiation Oncology | Admitting: Radiation Oncology

## 2020-06-10 DIAGNOSIS — C3411 Malignant neoplasm of upper lobe, right bronchus or lung: Secondary | ICD-10-CM | POA: Diagnosis not present

## 2020-06-10 DIAGNOSIS — Z87891 Personal history of nicotine dependence: Secondary | ICD-10-CM | POA: Diagnosis not present

## 2020-06-10 DIAGNOSIS — Z452 Encounter for adjustment and management of vascular access device: Secondary | ICD-10-CM | POA: Diagnosis not present

## 2020-06-10 DIAGNOSIS — Z5111 Encounter for antineoplastic chemotherapy: Secondary | ICD-10-CM | POA: Diagnosis not present

## 2020-06-10 DIAGNOSIS — C3412 Malignant neoplasm of upper lobe, left bronchus or lung: Secondary | ICD-10-CM | POA: Diagnosis not present

## 2020-06-10 DIAGNOSIS — Z79899 Other long term (current) drug therapy: Secondary | ICD-10-CM | POA: Diagnosis not present

## 2020-06-10 DIAGNOSIS — Z510 Encounter for antineoplastic radiation therapy: Secondary | ICD-10-CM | POA: Diagnosis not present

## 2020-06-10 DIAGNOSIS — Z5112 Encounter for antineoplastic immunotherapy: Secondary | ICD-10-CM | POA: Diagnosis not present

## 2020-06-13 ENCOUNTER — Encounter: Payer: Self-pay | Admitting: Pulmonary Disease

## 2020-06-13 ENCOUNTER — Telehealth: Payer: Self-pay | Admitting: Pulmonary Disease

## 2020-06-13 ENCOUNTER — Other Ambulatory Visit: Payer: Self-pay

## 2020-06-13 ENCOUNTER — Ambulatory Visit (INDEPENDENT_AMBULATORY_CARE_PROVIDER_SITE_OTHER): Payer: Medicare Other | Admitting: Pulmonary Disease

## 2020-06-13 ENCOUNTER — Ambulatory Visit
Admission: RE | Admit: 2020-06-13 | Discharge: 2020-06-13 | Disposition: A | Discharge status: Home / Self Care | Payer: Medicare Other | Source: Ambulatory Visit | Attending: Radiation Oncology | Admitting: Radiation Oncology

## 2020-06-13 VITALS — BP 108/70 | HR 81 | Temp 98.2°F | Ht 67.0 in | Wt 151.2 lb

## 2020-06-13 DIAGNOSIS — C7801 Secondary malignant neoplasm of right lung: Secondary | ICD-10-CM | POA: Insufficient documentation

## 2020-06-13 DIAGNOSIS — Z5112 Encounter for antineoplastic immunotherapy: Secondary | ICD-10-CM | POA: Diagnosis not present

## 2020-06-13 DIAGNOSIS — Z860100 Personal history of colon polyps, unspecified: Secondary | ICD-10-CM | POA: Insufficient documentation

## 2020-06-13 DIAGNOSIS — R0600 Dyspnea, unspecified: Secondary | ICD-10-CM | POA: Diagnosis not present

## 2020-06-13 DIAGNOSIS — N400 Enlarged prostate without lower urinary tract symptoms: Secondary | ICD-10-CM | POA: Insufficient documentation

## 2020-06-13 DIAGNOSIS — R0609 Other forms of dyspnea: Secondary | ICD-10-CM

## 2020-06-13 DIAGNOSIS — C7802 Secondary malignant neoplasm of left lung: Secondary | ICD-10-CM | POA: Insufficient documentation

## 2020-06-13 DIAGNOSIS — Z720 Tobacco use: Secondary | ICD-10-CM | POA: Insufficient documentation

## 2020-06-13 DIAGNOSIS — Z87891 Personal history of nicotine dependence: Secondary | ICD-10-CM | POA: Diagnosis not present

## 2020-06-13 DIAGNOSIS — C3491 Malignant neoplasm of unspecified part of right bronchus or lung: Secondary | ICD-10-CM

## 2020-06-13 DIAGNOSIS — M5430 Sciatica, unspecified side: Secondary | ICD-10-CM | POA: Insufficient documentation

## 2020-06-13 DIAGNOSIS — J432 Centrilobular emphysema: Secondary | ICD-10-CM

## 2020-06-13 DIAGNOSIS — Z510 Encounter for antineoplastic radiation therapy: Secondary | ICD-10-CM | POA: Diagnosis not present

## 2020-06-13 DIAGNOSIS — C3411 Malignant neoplasm of upper lobe, right bronchus or lung: Secondary | ICD-10-CM | POA: Diagnosis not present

## 2020-06-13 DIAGNOSIS — C3412 Malignant neoplasm of upper lobe, left bronchus or lung: Secondary | ICD-10-CM | POA: Diagnosis not present

## 2020-06-13 DIAGNOSIS — J309 Allergic rhinitis, unspecified: Secondary | ICD-10-CM | POA: Insufficient documentation

## 2020-06-13 DIAGNOSIS — D7589 Other specified diseases of blood and blood-forming organs: Secondary | ICD-10-CM | POA: Insufficient documentation

## 2020-06-13 DIAGNOSIS — Z5111 Encounter for antineoplastic chemotherapy: Secondary | ICD-10-CM | POA: Diagnosis not present

## 2020-06-13 DIAGNOSIS — C3490 Malignant neoplasm of unspecified part of unspecified bronchus or lung: Secondary | ICD-10-CM | POA: Insufficient documentation

## 2020-06-13 DIAGNOSIS — Z79899 Other long term (current) drug therapy: Secondary | ICD-10-CM | POA: Diagnosis not present

## 2020-06-13 DIAGNOSIS — Z452 Encounter for adjustment and management of vascular access device: Secondary | ICD-10-CM | POA: Diagnosis not present

## 2020-06-13 MED ORDER — ALBUTEROL SULFATE HFA 108 (90 BASE) MCG/ACT IN AERS: 2.0000 | INHALATION_SPRAY | Freq: Four times a day (QID) | RESPIRATORY_TRACT | 6 refills | Status: AC | PRN

## 2020-06-13 MED ORDER — ALBUTEROL SULFATE (2.5 MG/3ML) 0.083% IN NEBU: 2.5000 mg | INHALATION_SOLUTION | Freq: Four times a day (QID) | RESPIRATORY_TRACT | 12 refills | Status: AC | PRN

## 2020-06-13 MED ORDER — TIOTROPIUM BROMIDE-OLODATEROL 2.5-2.5 MCG/ACT IN AERS: 2.0000 | INHALATION_SPRAY | Freq: Every day | RESPIRATORY_TRACT | 3 refills | Status: DC

## 2020-06-13 MED ORDER — TIOTROPIUM BROMIDE-OLODATEROL 2.5-2.5 MCG/ACT IN AERS: 2.0000 | INHALATION_SPRAY | Freq: Every day | RESPIRATORY_TRACT | 0 refills | Status: AC

## 2020-06-13 NOTE — Telephone Encounter (Signed)
Called and spoke with patient, advised that the order has been sent to a DME company for the nebulizer machine and they will call him regarding picking up the machine.  Nothing further needed.

## 2020-06-13 NOTE — Patient Instructions (Addendum)
Thank you for visiting Dr. Valeta Harms at [facility]. Today we recommend the following:  Stiolto samples + new prescription  Albuterol inhaler prescription Albuterol solution, + nebulizer DME supplies   Return in about 4 months (around 10/14/2020) for with APP or Dr. Valeta Harms.    Please do your part to reduce the spread of COVID-19.

## 2020-06-13 NOTE — Progress Notes (Signed)
Synopsis: Referred in March 2022 for bilateral lung mass, Dr. Earlie Server, PCP: By [name], MD  Subjective:   PATIENT ID: Riley Sharp GENDER: male DOB: Mar 02, 1945, MRN: [id number]  Chief Complaint  Patient presents with  . Follow-up    No concerns.    This is a 75 year old gentleman with a past medical history of basal cell carcinoma of the right ear, coronary artery disease, hypertension, hyperlipidemia, AAA.  Patient was a former smoker quit smoking and moved to cigars.  Recently quit cigar smoking back in January.  Patient presented to primary care with coughing up blood small amounts of blood-tinged sputum.  Patient had a CT scan of the chest on 03/31/2020.  This CT scan revealed bilateral upper lobe lung masses both at approximately 4 cm in largest cross-section there was also a small left-sided loculated pleural effusion.  Imaging concerning for an advanced age bronchogenic carcinoma.  Patient was referred for evaluation tissue biopsy as well as removal of the pleural fluid for staging and diagnosis of what appears to be an advanced stage malignancy.  Patient has already established care with medical oncology as well as radiation oncology.  Patient presents today to discuss bronchoscopy and tissue sampling.  Additionally he has a PET scan that has been ordered as well as an MRI of the brain to complete staging.  OV 06/13/2020: Patient here today for follow-up after bronchoscopy.  Patient was diagnosed with stage IV squamous cell carcinoma of the lung.  Has establish care with Dr. Earlie Server.  Has started chemotherapy and radiation.  Last seen by medical oncology on 05/25/2020, office note reviewed.  Patient undergoing carboplatinum plus paclitaxel plus Keytruda first dose was on 05/02/2020.  Patient states he has 2 more cycles of chemo and 2 more sessions of radiation.  He is looking forward to going to his granddaughter's graduation.  He is not on any inhalers at this time.  Does have shortness of  breath with exertion.  CT imaging with centrilobular emphysema.  Has not had pulmonary function test to confirm COPD.   Past Medical History:  Diagnosis Date  . AAA (abdominal aortic aneurysm) ([place])   . CAD (coronary artery disease)   . Cancer ([place])    basal cell carcimona right ear and upper left at shoulder  . Glaucoma   . Hyperlipidemia   . Hypertension   . Myocardial infarction ([place]) 03/2003  . Wears glasses      Family History  Problem Relation Age of Onset  . Cancer Father   . Heart disease Father        After age 73  . Hyperlipidemia Father   . Hypertension Father   . Heart attack Father   . Heart disease Sister        After age 51  . Hyperlipidemia Sister   . Heart attack Sister   . Hypertension Other   . Heart disease Other   . Cancer Other      Past Surgical History:  Procedure Laterality Date  . ABDOMINAL AORTIC ENDOVASCULAR STENT GRAFT N/A 11/14/2015   Procedure: ABDOMINAL AORTIC ENDOVASCULAR STENT GRAFT;  Surgeon: Elam Dutch, MD;  Location: [facility];  Service: Vascular;  Laterality: N/A;  . APPENDECTOMY    . BRONCHIAL BIOPSY  04/18/2020   Procedure: BRONCHIAL BIOPSIES;  Surgeon: [name], DO;  Location: [facility];  Service: Pulmonary;;  . BRONCHIAL BRUSHINGS  04/18/2020   Procedure: BRONCHIAL BRUSHINGS;  Surgeon: [name], DO;  Location: [facility];  Service: Pulmonary;;  . BRONCHIAL NEEDLE ASPIRATION BIOPSY  04/18/2020   Procedure: BRONCHIAL NEEDLE ASPIRATION BIOPSIES;  Surgeon: [name], DO;  Location: [facility];  Service: Pulmonary;;  . BRONCHIAL WASHINGS  04/18/2020   Procedure: BRONCHIAL WASHINGS;  Surgeon: [name], DO;  Location: [facility];  Service: Pulmonary;;  . CHOLECYSTECTOMY  2007   Gall Bladder  . COLONOSCOPY W/ BIOPSIES AND POLYPECTOMY    . EYE SURGERY Bilateral    laser for glaucoma both eyesn x 2  . heart stent  Feb. 14, 2005  . IR IMAGING GUIDED PORT INSERTION  05/03/2020  . THORACENTESIS Left  04/18/2020   Procedure: THORACENTESIS;  Surgeon: [name], DO;  Location: [facility];  Service: Pulmonary;  Laterality: Left;  Marland Kitchen VIDEO BRONCHOSCOPY WITH ENDOBRONCHIAL NAVIGATION Bilateral 04/18/2020   Procedure: VIDEO BRONCHOSCOPY WITH ENDOBRONCHIAL NAVIGATION;  Surgeon: [name], DO;  Location: [facility];  Service: Pulmonary;  Laterality: Bilateral;    Social History   Socioeconomic History  . Marital status: Single    Spouse name: Not on file  . Number of children: Not on file  . Years of education: Not on file  . Highest education level: Not on file  Occupational History  . Not on file  Tobacco Use  . Smoking status: Former Smoker    Packs/day: 2.00    Years: 45.00    Pack years: 90.00    Types: Cigars  . Smokeless tobacco: Never Used  . Tobacco comment: 2-3 cigars per day--quit Jan 2022  Vaping Use  . Vaping Use: Never used  Substance and Sexual Activity  . Alcohol use: Yes    Alcohol/week: 0.0 standard drinks    Comment: rare  . Drug use: No  . Sexual activity: Not on file  Other Topics Concern  . Not on file  Social History Narrative  . Not on file   Social Determinants of Health   Financial Resource Strain: Not on file  Food Insecurity: Not on file  Transportation Needs: Not on file  Physical Activity: Not on file  Stress: Not on file  Social Connections: Not on file  Intimate Partner Violence: Not on file     No Known Allergies   Outpatient Medications Prior to Visit  Medication Sig Dispense Refill  . acetaminophen (TYLENOL) 500 MG tablet Take 500 mg by mouth every 6 (six) hours as needed.    Marland Kitchen aspirin EC 81 MG tablet Take 81 mg by mouth daily.    . brimonidine (ALPHAGAN) 0.2 % ophthalmic solution Place 1 drop into both eyes 3 (three) times daily.    . dorzolamide (TRUSOPT) 2 % ophthalmic solution Place 1 drop into both eyes 3 (three) times daily.    Marland Kitchen latanoprost (XALATAN) 0.005 % ophthalmic solution Place 1 drop into both eyes at  bedtime.    . lidocaine-prilocaine (EMLA) cream Apply 1 application topically as needed. 30 g 2  . loratadine (CLARITIN) 10 MG tablet Take 10 mg by mouth daily.    . metoprolol succinate (TOPROL-XL) 25 MG 24 hr tablet Take 25 mg by mouth daily.    . ondansetron (ZOFRAN) 8 MG tablet Take 1 tablet (8 mg total) by mouth every 8 (eight) hours as needed for nausea or vomiting. 30 tablet 2  . prochlorperazine (COMPAZINE) 10 MG tablet Take 1 tablet (10 mg total) by mouth every 6 (six) hours as needed. 30 tablet 2  . ramipril (ALTACE) 5 MG tablet Take 5 mg by mouth daily.    Marland Kitchen  rosuvastatin (CRESTOR) 10 MG tablet Take 10 mg by mouth daily.    . timolol (TIMOPTIC) 0.5 % ophthalmic solution Place 1 drop into both eyes daily.     No facility-administered medications prior to visit.    Review of Systems  Constitutional: Negative for chills, fever, malaise/fatigue and weight loss.  HENT: Negative for hearing loss, sore throat and tinnitus.   Eyes: Negative for blurred vision and double vision.  Respiratory: Positive for cough and sputum production. Negative for hemoptysis, shortness of breath, wheezing and stridor.   Cardiovascular: Negative for chest pain, palpitations, orthopnea, leg swelling and PND.  Gastrointestinal: Negative for abdominal pain, constipation, diarrhea, heartburn, nausea and vomiting.  Genitourinary: Negative for dysuria, hematuria and urgency.  Musculoskeletal: Negative for joint pain and myalgias.  Skin: Negative for itching and rash.  Neurological: Negative for dizziness, tingling, weakness and headaches.  Endo/Heme/Allergies: Negative for environmental allergies. Does not bruise/bleed easily.  Psychiatric/Behavioral: Negative for depression. The patient is not nervous/anxious and does not have insomnia.   All other systems reviewed and are negative.    Objective:  Physical Exam Vitals reviewed.  Constitutional:      General: He is not in acute distress.    Appearance: He  is well-developed.  HENT:     Head: Normocephalic and atraumatic.  Eyes:     General: No scleral icterus.    Conjunctiva/sclera: Conjunctivae normal.     Pupils: Pupils are equal, round, and reactive to light.  Neck:     Vascular: No JVD.     Trachea: No tracheal deviation.  Cardiovascular:     Rate and Rhythm: Normal rate and regular rhythm.     Heart sounds: Normal heart sounds. No murmur heard.   Pulmonary:     Effort: Pulmonary effort is normal. No tachypnea, accessory muscle usage or respiratory distress.     Breath sounds: No stridor. No wheezing, rhonchi or rales.     Comments: Diminished breath sounds bilaterally Abdominal:     General: Bowel sounds are normal. There is no distension.     Palpations: Abdomen is soft.     Tenderness: There is no abdominal tenderness.  Musculoskeletal:        General: No tenderness.     Cervical back: Neck supple.  Lymphadenopathy:     Cervical: No cervical adenopathy.  Skin:    General: Skin is warm and dry.     Capillary Refill: Capillary refill takes less than 2 seconds.     Findings: No rash.  Neurological:     Mental Status: He is alert and oriented to person, place, and time.  Psychiatric:        Behavior: Behavior normal.      Vitals:   06/13/20 1054  BP: 108/70  Pulse: 81  Temp: 98.2 F (36.8 C)  TempSrc: Temporal  SpO2: 99%  Weight: 151 lb 3.2 oz (68.6 kg)  Height: 5\' 7"  (1.702 m)   99% on RA BMI Readings from Last 3 Encounters:  06/13/20 23.68 kg/m  05/25/20 24.37 kg/m  05/11/20 23.88 kg/m   Wt Readings from Last 3 Encounters:  06/13/20 151 lb 3.2 oz (68.6 kg)  05/25/20 155 lb 9.6 oz (70.6 kg)  05/11/20 152 lb 8 oz (69.2 kg)     CBC    Component Value Date/Time   WBC 19.7 (H) 06/08/2020 1337   WBC 7.6 11/15/2015 0440   RBC 3.81 (L) 06/08/2020 1337   HGB 11.2 (L) 06/08/2020 1337   HCT 35.4 (L)  06/08/2020 1337   PLT 189 06/08/2020 1337   MCV 92.9 06/08/2020 1337   MCH 29.4 06/08/2020 1337    MCHC 31.6 06/08/2020 1337   RDW 14.8 06/08/2020 1337   LYMPHSABS 1.0 06/08/2020 1337   MONOABS 0.9 06/08/2020 1337   EOSABS 0.1 06/08/2020 1337   BASOSABS 0.1 06/08/2020 1337     Chest Imaging: CT chest 03/31/2020: Bilateral 4 cm right upper lobe and left upper lobe lung masses concerning for malignancy.  Additionally has a loculated left-sided pleural effusion which also appears potentially malignant in nature. The patient's images have been independently reviewed by me.    Pulmonary Functions Testing Results: No flowsheet data found.  FeNO:   Pathology:   Echocardiogram:   Heart Catheterization:     Assessment & Plan:     ICD-10-CM   1. Centrilobular emphysema ([place])  J43.2   2. Squamous cell carcinoma of lung, stage IV, right (H[facility])  C34.91   3. DOE (dyspnea on exertion)  R06.00     Discussion:  This is a 75 year old gentleman, CT imaging concerning for an advanced age bronchogenic carcinoma, former smoker quit in January.  Has evidence of centrilobular emphysema on CT imaging.  No formal diagnosis of COPD however I suspect he does have chronic obstructive pulmonary disease.  Plan: Completion of chemotherapy regimen plus radiation. We will start treatment for his COPD. Stiolto plus albuterol as needed New prescription for DME supplies for albuterol nebulizer. Patient to let us know if he has any changes in his respiratory symptoms or has additional episodes of hemoptysis. Patient to follow-up with [place] in 3 to 4 months.    Current Outpatient Medications:  .  acetaminophen (TYLENOL) 500 MG tablet, Take 500 mg by mouth every 6 (six) hours as needed., Disp: , Rfl:  .  aspirin EC 81 MG tablet, Take 81 mg by mouth daily., Disp: , Rfl:  .  brimonidine (ALPHAGAN) 0.2 % ophthalmic solution, Place 1 drop into both eyes 3 (three) times daily., Disp: , Rfl:  .  dorzolamide (TRUSOPT) 2 % ophthalmic solution, Place 1 drop into both eyes 3 (three) times daily., Disp: , Rfl:  .   latanoprost (XALATAN) 0.005 % ophthalmic solution, Place 1 drop into both eyes at bedtime., Disp: , Rfl:  .  lidocaine-prilocaine (EMLA) cream, Apply 1 application topically as needed., Disp: 30 g, Rfl: 2 .  loratadine (CLARITIN) 10 MG tablet, Take 10 mg by mouth daily., Disp: , Rfl:  .  metoprolol succinate (TOPROL-XL) 25 MG 24 hr tablet, Take 25 mg by mouth daily., Disp: , Rfl:  .  ondansetron (ZOFRAN) 8 MG tablet, Take 1 tablet (8 mg total) by mouth every 8 (eight) hours as needed for nausea or vomiting., Disp: 30 tablet, Rfl: 2 .  prochlorperazine (COMPAZINE) 10 MG tablet, Take 1 tablet (10 mg total) by mouth every 6 (six) hours as needed., Disp: 30 tablet, Rfl: 2 .  ramipril (ALTACE) 5 MG tablet, Take 5 mg by mouth daily., Disp: , Rfl:  .  rosuvastatin (CRESTOR) 10 MG tablet, Take 10 mg by mouth daily., Disp: , Rfl:  .  timolol (TIMOPTIC) 0.5 % ophthalmic solution, Place 1 drop into both eyes daily., Disp: , Rfl:     [name], DO  Pulmonary Critical Care 06/13/2020 11:29 AM

## 2020-06-13 NOTE — Addendum Note (Signed)
Addended by: Vanessa Barbara on: 06/13/2020 03:43 PM   Modules accepted: Orders

## 2020-06-14 ENCOUNTER — Ambulatory Visit
Admission: RE | Admit: 2020-06-14 | Discharge: 2020-06-14 | Disposition: A | Discharge status: Home / Self Care | Payer: Medicare Other | Source: Ambulatory Visit | Attending: Radiation Oncology | Admitting: Radiation Oncology

## 2020-06-14 DIAGNOSIS — Z510 Encounter for antineoplastic radiation therapy: Secondary | ICD-10-CM | POA: Diagnosis not present

## 2020-06-14 DIAGNOSIS — Z79899 Other long term (current) drug therapy: Secondary | ICD-10-CM | POA: Diagnosis not present

## 2020-06-14 DIAGNOSIS — Z5111 Encounter for antineoplastic chemotherapy: Secondary | ICD-10-CM | POA: Diagnosis not present

## 2020-06-14 DIAGNOSIS — Z87891 Personal history of nicotine dependence: Secondary | ICD-10-CM | POA: Diagnosis not present

## 2020-06-14 DIAGNOSIS — C3411 Malignant neoplasm of upper lobe, right bronchus or lung: Secondary | ICD-10-CM | POA: Diagnosis not present

## 2020-06-14 DIAGNOSIS — Z5112 Encounter for antineoplastic immunotherapy: Secondary | ICD-10-CM | POA: Diagnosis not present

## 2020-06-14 DIAGNOSIS — Z452 Encounter for adjustment and management of vascular access device: Secondary | ICD-10-CM | POA: Diagnosis not present

## 2020-06-14 DIAGNOSIS — C3412 Malignant neoplasm of upper lobe, left bronchus or lung: Secondary | ICD-10-CM | POA: Diagnosis not present

## 2020-06-15 ENCOUNTER — Inpatient Hospital Stay (HOSPITAL_BASED_OUTPATIENT_CLINIC_OR_DEPARTMENT_OTHER): Payer: Medicare Other | Admitting: Internal Medicine

## 2020-06-15 ENCOUNTER — Other Ambulatory Visit: Payer: Self-pay

## 2020-06-15 ENCOUNTER — Ambulatory Visit
Admission: RE | Admit: 2020-06-15 | Discharge: 2020-06-15 | Disposition: A | Discharge status: Home / Self Care | Payer: Medicare Other | Source: Ambulatory Visit | Attending: Radiation Oncology | Admitting: Radiation Oncology

## 2020-06-15 ENCOUNTER — Other Ambulatory Visit: Payer: Medicare Other

## 2020-06-15 ENCOUNTER — Inpatient Hospital Stay: Payer: Medicare Other

## 2020-06-15 ENCOUNTER — Encounter: Payer: Self-pay | Admitting: Internal Medicine

## 2020-06-15 ENCOUNTER — Encounter: Payer: Self-pay | Admitting: *Deleted

## 2020-06-15 VITALS — BP 112/58 | HR 81 | Temp 97.1°F | Resp 19 | Ht 67.0 in | Wt 151.8 lb

## 2020-06-15 DIAGNOSIS — Z79899 Other long term (current) drug therapy: Secondary | ICD-10-CM | POA: Diagnosis not present

## 2020-06-15 DIAGNOSIS — C3490 Malignant neoplasm of unspecified part of unspecified bronchus or lung: Secondary | ICD-10-CM

## 2020-06-15 DIAGNOSIS — C3491 Malignant neoplasm of unspecified part of right bronchus or lung: Secondary | ICD-10-CM

## 2020-06-15 DIAGNOSIS — Z452 Encounter for adjustment and management of vascular access device: Secondary | ICD-10-CM | POA: Diagnosis not present

## 2020-06-15 DIAGNOSIS — Z95828 Presence of other vascular implants and grafts: Secondary | ICD-10-CM

## 2020-06-15 DIAGNOSIS — C3411 Malignant neoplasm of upper lobe, right bronchus or lung: Secondary | ICD-10-CM | POA: Diagnosis not present

## 2020-06-15 DIAGNOSIS — Z5111 Encounter for antineoplastic chemotherapy: Secondary | ICD-10-CM | POA: Diagnosis not present

## 2020-06-15 DIAGNOSIS — C3412 Malignant neoplasm of upper lobe, left bronchus or lung: Secondary | ICD-10-CM | POA: Diagnosis not present

## 2020-06-15 DIAGNOSIS — Z87891 Personal history of nicotine dependence: Secondary | ICD-10-CM | POA: Diagnosis not present

## 2020-06-15 DIAGNOSIS — Z510 Encounter for antineoplastic radiation therapy: Secondary | ICD-10-CM | POA: Diagnosis not present

## 2020-06-15 DIAGNOSIS — Z5112 Encounter for antineoplastic immunotherapy: Secondary | ICD-10-CM

## 2020-06-15 LAB — CBC WITH DIFFERENTIAL (CANCER CENTER ONLY)
Abs Immature Granulocytes: 0.06 10*3/uL (ref 0.00–0.07)
Basophils Absolute: 0.1 10*3/uL (ref 0.0–0.1)
Basophils Relative: 0 %
Eosinophils Absolute: 0.1 10*3/uL (ref 0.0–0.5)
Eosinophils Relative: 0 %
HCT: 34.2 % — ABNORMAL LOW (ref 39.0–52.0)
Hemoglobin: 11.2 g/dL — ABNORMAL LOW (ref 13.0–17.0)
Immature Granulocytes: 1 %
Lymphocytes Relative: 4 %
Lymphs Abs: 0.5 10*3/uL — ABNORMAL LOW (ref 0.7–4.0)
MCH: 30.5 pg (ref 26.0–34.0)
MCHC: 32.7 g/dL (ref 30.0–36.0)
MCV: 93.2 fL (ref 80.0–100.0)
Monocytes Absolute: 0.6 10*3/uL (ref 0.1–1.0)
Monocytes Relative: 6 %
Neutro Abs: 10 10*3/uL — ABNORMAL HIGH (ref 1.7–7.7)
Neutrophils Relative %: 89 %
Platelet Count: 267 10*3/uL (ref 150–400)
RBC: 3.67 MIL/uL — ABNORMAL LOW (ref 4.22–5.81)
RDW: 15.4 % (ref 11.5–15.5)
WBC Count: 11.3 10*3/uL — ABNORMAL HIGH (ref 4.0–10.5)
nRBC: 0 % (ref 0.0–0.2)

## 2020-06-15 LAB — CMP (CANCER CENTER ONLY)
ALT: 19 U/L (ref 0–44)
AST: 19 U/L (ref 15–41)
Albumin: 3 g/dL — ABNORMAL LOW (ref 3.5–5.0)
Alkaline Phosphatase: 93 U/L (ref 38–126)
Anion gap: 8 (ref 5–15)
BUN: 16 mg/dL (ref 8–23)
CO2: 25 mmol/L (ref 22–32)
Calcium: 9.1 mg/dL (ref 8.9–10.3)
Chloride: 103 mmol/L (ref 98–111)
Creatinine: 1.07 mg/dL (ref 0.61–1.24)
GFR, Estimated: >60 mL/min (ref >60)
Glucose, Bld: 110 mg/dL — ABNORMAL HIGH (ref 70–99)
Potassium: 4.3 mmol/L (ref 3.5–5.1)
Sodium: 136 mmol/L (ref 135–145)
Total Bilirubin: 0.2 mg/dL — ABNORMAL LOW (ref 0.3–1.2)
Total Protein: 7.1 g/dL (ref 6.5–8.1)

## 2020-06-15 MED ORDER — FAMOTIDINE 20 MG IN NS 100 ML IVPB
20.0000 mg | Freq: Once | INTRAVENOUS | Status: AC
  Administered 2020-06-15: 20 mg via INTRAVENOUS

## 2020-06-15 MED ORDER — SODIUM CHLORIDE 0.9% FLUSH
10.0000 mL | INTRAVENOUS | Status: DC | PRN
  Administered 2020-06-15: 10 mL
  Filled 2020-06-15: qty 10

## 2020-06-15 MED ORDER — DIPHENHYDRAMINE HCL 50 MG/ML IJ SOLN
INTRAMUSCULAR | Status: AC
  Filled 2020-06-15: qty 1

## 2020-06-15 MED ORDER — PALONOSETRON HCL INJECTION 0.25 MG/5ML
0.2500 mg | Freq: Once | INTRAVENOUS | Status: AC
  Administered 2020-06-15: 0.25 mg via INTRAVENOUS

## 2020-06-15 MED ORDER — HEPARIN SOD (PORK) LOCK FLUSH 100 UNIT/ML IV SOLN
500.0000 [IU] | Freq: Once | INTRAVENOUS | Status: AC | PRN
  Administered 2020-06-15: 500 [IU]
  Filled 2020-06-15: qty 5

## 2020-06-15 MED ORDER — SODIUM CHLORIDE 0.9 % IV SOLN
175.0000 mg/m2 | Freq: Once | INTRAVENOUS | Status: AC
  Administered 2020-06-15: 318 mg via INTRAVENOUS
  Filled 2020-06-15: qty 53

## 2020-06-15 MED ORDER — SODIUM CHLORIDE 0.9 % IV SOLN
Freq: Once | INTRAVENOUS | Status: AC
  Filled 2020-06-15: qty 250

## 2020-06-15 MED ORDER — DEXAMETHASONE SODIUM PHOSPHATE 100 MG/10ML IJ SOLN
10.0000 mg | Freq: Once | INTRAMUSCULAR | Status: AC
  Administered 2020-06-15: 10 mg via INTRAVENOUS
  Filled 2020-06-15: qty 10

## 2020-06-15 MED ORDER — SODIUM CHLORIDE 0.9 % IV SOLN
200.0000 mg | Freq: Once | INTRAVENOUS | Status: AC
  Administered 2020-06-15: 200 mg via INTRAVENOUS
  Filled 2020-06-15: qty 8

## 2020-06-15 MED ORDER — SODIUM CHLORIDE 0.9 % IV SOLN
434.5000 mg | Freq: Once | INTRAVENOUS | Status: AC
  Administered 2020-06-15: 430 mg via INTRAVENOUS
  Filled 2020-06-15: qty 43

## 2020-06-15 MED ORDER — PALONOSETRON HCL INJECTION 0.25 MG/5ML
INTRAVENOUS | Status: AC
  Filled 2020-06-15: qty 5

## 2020-06-15 MED ORDER — DIPHENHYDRAMINE HCL 50 MG/ML IJ SOLN
50.0000 mg | Freq: Once | INTRAMUSCULAR | Status: AC
Start: 2020-06-15 — End: 2020-06-15
  Administered 2020-06-15: 50 mg via INTRAVENOUS

## 2020-06-15 MED ORDER — SODIUM CHLORIDE 0.9% FLUSH
10.0000 mL | INTRAVENOUS | Status: DC | PRN
  Administered 2020-06-15: 10 mL via INTRAVENOUS
  Filled 2020-06-15: qty 10

## 2020-06-15 MED ORDER — SODIUM CHLORIDE 0.9 % IV SOLN
150.0000 mg | Freq: Once | INTRAVENOUS | Status: AC
  Administered 2020-06-15: 150 mg via INTRAVENOUS
  Filled 2020-06-15: qty 150

## 2020-06-15 MED ORDER — FAMOTIDINE 20 MG IN NS 100 ML IVPB
INTRAVENOUS | Status: AC
  Filled 2020-06-15: qty 100

## 2020-06-15 NOTE — Patient Instructions (Signed)
Implanted Port Insertion, Care After This sheet gives you information about how to care for yourself after your procedure. Your health care provider may also give you more specific instructions. If you have problems or questions, contact your health care provider. What can I expect after the procedure? After the procedure, it is common to have:  Discomfort at the port insertion site.  Bruising on the skin over the port. This should improve over 3-4 days. Follow these instructions at home: Port care  After your port is placed, you will get a manufacturer's information card. The card has information about your port. Keep this card with you at all times.  Take care of the port as told by your health care provider. Ask your health care provider if you or a family member can get training for taking care of the port at home. A home health care nurse may also take care of the port.  Make sure to remember what type of port you have. Incision care  Follow instructions from your health care provider about how to take care of your port insertion site. Make sure you: ? Wash your hands with soap and water before and after you change your bandage (dressing). If soap and water are not available, use hand sanitizer. ? Change your dressing as told by your health care provider. ? Leave stitches (sutures), skin glue, or adhesive strips in place. These skin closures may need to stay in place for 2 weeks or longer. If adhesive strip edges start to loosen and curl up, you may trim the loose edges. Do not remove adhesive strips completely unless your health care provider tells you to do that.  Check your port insertion site every day for signs of infection. Check for: ? Redness, swelling, or pain. ? Fluid or blood. ? Warmth. ? Pus or a bad smell.      Activity  Return to your normal activities as told by your health care provider. Ask your health care provider what activities are safe for you.  Do not  lift anything that is heavier than 10 lb (4.5 kg), or the limit that you are told, until your health care provider says that it is safe. General instructions  Take over-the-counter and prescription medicines only as told by your health care provider.  Do not take baths, swim, or use a hot tub until your health care provider approves. Ask your health care provider if you may take showers. You may only be allowed to take sponge baths.  Do not drive for 24 hours if you were given a sedative during your procedure.  Wear a medical alert bracelet in case of an emergency. This will tell any health care providers that you have a port.  Keep all follow-up visits as told by your health care provider. This is important. Contact a health care provider if:  You cannot flush your port with saline as directed, or you cannot draw blood from the port.  You have a fever or chills.  You have redness, swelling, or pain around your port insertion site.  You have fluid or blood coming from your port insertion site.  Your port insertion site feels warm to the touch.  You have pus or a bad smell coming from the port insertion site. Get help right away if:  You have chest pain or shortness of breath.  You have bleeding from your port that you cannot control. Summary  Take care of the port as told by your   health care provider. Keep the manufacturer's information card with you at all times.  Change your dressing as told by your health care provider.  Contact a health care provider if you have a fever or chills or if you have redness, swelling, or pain around your port insertion site.  Keep all follow-up visits as told by your health care provider. This information is not intended to replace advice given to you by your health care provider. Make sure you discuss any questions you have with your health care provider. Document Revised: 08/13/2017 Document Reviewed: 08/13/2017 Elsevier Patient Education   2021 Elsevier Inc.  

## 2020-06-15 NOTE — Progress Notes (Signed)
Patient stated that he had numbness and tingling of finger tips of both hands for several days after his last treatment. It did not impact his daily activities nor was it painful. Dr. Julien Nordmann made aware.

## 2020-06-15 NOTE — Patient Instructions (Signed)
[facility]  Discharge Instructions: Thank you for choosing [facility] to provide your oncology and hematology care.   If you have a lab appointment with the [facility], please go directly to the [facility] and check in at the registration area.   Wear comfortable clothing and clothing appropriate for easy access to any Portacath or PICC line.   We strive to give you quality time with your provider. You may need to reschedule your appointment if you arrive late (15 or more minutes).  Arriving late affects you and other patients whose appointments are after yours.  Also, if you miss three or more appointments without notifying the office, you may be dismissed from the clinic at the provider's discretion.      For prescription refill requests, have your pharmacy contact our office and allow 72 hours for refills to be completed.    Today you received the following chemotherapy and/or immunotherapy agents: Keytruda, Taxol, Carboplatin     To help prevent nausea and vomiting after your treatment, we encourage you to take your nausea medication as directed.  BELOW ARE SYMPTOMS THAT SHOULD BE REPORTED IMMEDIATELY: . *FEVER GREATER THAN 100.4 F (38 C) OR HIGHER . *CHILLS OR SWEATING . *NAUSEA AND VOMITING THAT IS NOT CONTROLLED WITH YOUR NAUSEA MEDICATION . *UNUSUAL SHORTNESS OF BREATH . *UNUSUAL BRUISING OR BLEEDING . *URINARY PROBLEMS (pain or burning when urinating, or frequent urination) . *BOWEL PROBLEMS (unusual diarrhea, constipation, pain near the anus) . TENDERNESS IN MOUTH AND THROAT WITH OR WITHOUT PRESENCE OF ULCERS (sore throat, sores in mouth, or a toothache) . UNUSUAL RASH, SWELLING OR PAIN  . UNUSUAL VAGINAL DISCHARGE OR ITCHING   Items with * indicate a potential emergency and should be followed up as soon as possible or go to the Emergency Department if any problems should occur.  Please show the CHEMOTHERAPY ALERT CARD or  IMMUNOTHERAPY ALERT CARD at check-in to the Emergency Department and triage nurse.  Should you have questions after your visit or need to cancel or reschedule your appointment, please contact [facility]  Dept: [phone]  and follow the prompts.  Office hours are 8:00 a.m. to 4:30 p.m. Monday - Friday. Please note that voicemails left after 4:00 p.m. may not be returned until the following business day.  We are closed weekends and major holidays. You have access to a nurse at all times for urgent questions. Please call the main number to the clinic Dept: [phone] and follow the prompts.   For any non-urgent questions, you may also contact your provider using MyChart. We now offer e-Visits for anyone 10 and older to request care online for non-urgent symptoms. For details visit [email or web address].   Also download the MyChart app! Go to the app store, search "MyChart", open the app, select [facility], and log in with your MyChart username and password.  Due to Covid, a mask is required upon entering the hospital/clinic. If you do not have a mask, one will be given to you upon arrival. For doctor visits, patients may have 1 support person aged 46 or older with them. For treatment visits, patients cannot have anyone with them due to current Covid guidelines and our immunocompromised population.

## 2020-06-15 NOTE — Progress Notes (Signed)
I was able to speak to Riley Sharp and his son today.  He verbalized understanding of treatment plan.  No barriers identified at this time.

## 2020-06-15 NOTE — Progress Notes (Signed)
[facility] [phone]   [phone]  OFFICE PROGRESS NOTE  [name], MD [address] [place] [address]  DIAGNOSIS: Stage IV non-small cell lung cancer, squamous cell carcinoma.  Riley Sharp presented with a right upper lobe lung mass, left upper lobe lung mass, and a loculated left pleural effusion.  Riley Sharp had mild hypermetabolism in the right hilar and paramediastinal lymphadenopathy.  Riley Sharp had a few small bone lesions on C2, L5, right ribs, and right iliac crest.  Riley Sharp was diagnosed in March 2022  PD-L1: 0%.  PRIOR THERAPY: None  CURRENT THERAPY: Systemic chemotherapy with carboplatin for an AUC of 5, paclitaxel 175 mg/m2, Keytruda 200 mg IV 3 weeks with Neulasta support.  First dose expected on Riley/Riley/2022.  Status post 2 cycles  INTERVAL HISTORY: Riley Sharp 75 y.o. Sharp returns to the clinic today for follow-up visit accompanied by his son.  The patient is feeling fine today with no concerning complaints except for few days of diarrhea and nausea after the Neulasta injection.  Riley Sharp lost few pounds since his last visit.  Riley Sharp denied having any current chest pain, shortness of breath, cough or hemoptysis.  Riley Sharp denied having any fever or chills.  Riley Sharp has no current nausea, vomiting, diarrhea or constipation.  Riley Sharp is here today for evaluation before starting cycle #3.  MEDICAL HISTORY: Past Medical History:  Diagnosis Date  . AAA (abdominal aortic aneurysm) ([place])   . CAD (coronary artery disease)   . Cancer ([place])    basal cell carcimona right ear and upper left at shoulder  . Glaucoma   . Hyperlipidemia   . Hypertension   . Myocardial infarction ([place]) 03/2003  . Wears glasses     ALLERGIES:  has No Known Allergies.  MEDICATIONS:  Current Outpatient Medications  Medication Sig Dispense Refill  . acetaminophen (TYLENOL) 500 MG tablet Take 500 mg by mouth every 6 (six) hours as needed.    Marland Kitchen albuterol (PROVENTIL) (2.5 MG/3ML) 0.083%  nebulizer solution Take 3 mLs (2.5 mg total) by nebulization every 6 (six) hours as needed for wheezing or shortness of breath. 75 mL 12  . albuterol (VENTOLIN HFA) 108 (90 Base) MCG/ACT inhaler Inhale 2 puffs into the lungs every 6 (six) hours as needed for wheezing or shortness of breath. 8 g 6  . aspirin EC 81 MG tablet Take 81 mg by mouth daily.    . brimonidine (ALPHAGAN) 0.2 % ophthalmic solution Place 1 drop into both eyes 3 (three) times daily.    . dorzolamide (TRUSOPT) 2 % ophthalmic solution Place 1 drop into both eyes 3 (three) times daily.    Marland Kitchen latanoprost (XALATAN) 0.005 % ophthalmic solution Place 1 drop into both eyes at bedtime.    . lidocaine-prilocaine (EMLA) cream Apply 1 application topically as needed. 30 g 2  . loratadine (CLARITIN) 10 MG tablet Take 10 mg by mouth daily.    . metoprolol succinate (TOPROL-XL) 25 MG 24 hr tablet Take 25 mg by mouth daily.    . ondansetron (ZOFRAN) 8 MG tablet Take 1 tablet (8 mg total) by mouth every 8 (eight) hours as needed for nausea or vomiting. 30 tablet 2  . prochlorperazine (COMPAZINE) 10 MG tablet Take 1 tablet (10 mg total) by mouth every 6 (six) hours as needed. 30 tablet 2  . ramipril (ALTACE) 5 MG tablet Take 5 mg by mouth daily.    . rosuvastatin (CRESTOR) 10 MG tablet Take  10 mg by mouth daily.    . timolol (TIMOPTIC) 0.5 % ophthalmic solution Place 1 drop into both eyes daily.    . Tiotropium Bromide-Olodaterol 2.5-2.5 MCG/ACT AERS Inhale 2 puffs into the lungs daily. 2 each 0  . Tiotropium Bromide-Olodaterol 2.5-2.5 MCG/ACT AERS Inhale 2 puffs into the lungs daily. 24 g 3   No current facility-administered medications for this visit.    SURGICAL HISTORY:  Past Surgical History:  Procedure Laterality Date  . ABDOMINAL AORTIC ENDOVASCULAR STENT GRAFT N/A 11/14/2015   Procedure: ABDOMINAL AORTIC ENDOVASCULAR STENT GRAFT;  Surgeon: Elam Dutch, MD;  Location: [facility];  Service: Vascular;  Laterality: N/A;  . APPENDECTOMY     . BRONCHIAL BIOPSY  04/18/2020   Procedure: BRONCHIAL BIOPSIES;  Surgeon: [name], DO;  Location: [facility];  Service: Pulmonary;;  . BRONCHIAL BRUSHINGS  04/18/2020   Procedure: BRONCHIAL BRUSHINGS;  Surgeon: [name], DO;  Location: [facility];  Service: Pulmonary;;  . BRONCHIAL NEEDLE ASPIRATION BIOPSY  04/18/2020   Procedure: BRONCHIAL NEEDLE ASPIRATION BIOPSIES;  Surgeon: [name], DO;  Location: [facility];  Service: Pulmonary;;  . BRONCHIAL WASHINGS  04/18/2020   Procedure: BRONCHIAL WASHINGS;  Surgeon: [name], DO;  Location: [facility];  Service: Pulmonary;;  . CHOLECYSTECTOMY  2007   Gall Bladder  . COLONOSCOPY W/ BIOPSIES AND POLYPECTOMY    . EYE SURGERY Bilateral    laser for glaucoma both eyesn x 2  . heart stent  Feb. 14, 2005  . IR IMAGING GUIDED PORT INSERTION  Riley/05/2020  . THORACENTESIS Left 04/18/2020   Procedure: THORACENTESIS;  Surgeon: [name], DO;  Location: [facility];  Service: Pulmonary;  Laterality: Left;  Marland Kitchen VIDEO BRONCHOSCOPY WITH ENDOBRONCHIAL NAVIGATION Bilateral 04/18/2020   Procedure: VIDEO BRONCHOSCOPY WITH ENDOBRONCHIAL NAVIGATION;  Surgeon: [name], DO;  Location: [facility];  Service: Pulmonary;  Laterality: Bilateral;    REVIEW OF SYSTEMS:  A comprehensive review of systems was negative except for: Constitutional: positive for fatigue   PHYSICAL EXAMINATION: General appearance: alert, cooperative, fatigued and no distress Head: Normocephalic, without obvious abnormality, atraumatic Neck: no adenopathy, no JVD, supple, symmetrical, trachea midline and thyroid not enlarged, symmetric, no tenderness/mass/nodules Lymph nodes: Cervical, supraclavicular, and axillary nodes normal. Resp: clear to auscultation bilaterally Back: symmetric, no curvature. ROM normal. No CVA tenderness. Cardio: regular rate and rhythm, S1, S2 normal, no murmur, click, rub or gallop GI: soft, non-tender; bowel sounds  normal; no masses,  no organomegaly Extremities: extremities normal, atraumatic, no cyanosis or edema  ECOG PERFORMANCE STATUS: 1 - Symptomatic but completely ambulatory  Blood pressure (!) 112/58, pulse 81, temperature (!) 97.1 F (36.2 C), temperature source Tympanic, resp. rate Riley, height $RemoveBe'5\' 7"'qJRkssGaP$  (1.702 m), weight 151 lb 12.8 oz (68.9 kg), SpO2 100 %.  LABORATORY DATA: Lab Results  Component Value Date   WBC 11.3 (H) 06/15/2020   HGB 11.2 (L) 06/15/2020   HCT 34.2 (L) 06/15/2020   MCV 93.2 06/15/2020   PLT 267 06/15/2020      Chemistry      Component Value Date/Time   NA 137 06/08/2020 1337   K Riley.Riley 06/08/2020 1337   CL 104 06/08/2020 1337   CO2 25 06/08/2020 1337   BUN 13 06/08/2020 1337   CREATININE 0.87 06/08/2020 1337      Component Value Date/Time   CALCIUM 8.9 06/08/2020 1337   ALKPHOS 118 06/08/2020 1337   AST 18 06/08/2020 1337   ALT 13 06/08/2020 1337  BILITOT 0.3 06/08/2020 1337       RADIOGRAPHIC STUDIES: No results found.  ASSESSMENT AND PLAN: This is a very pleasant Riley Sharp recently diagnosed with a stage IV (T2b, N2, M1 C) non-small cell lung cancer, squamous cell carcinoma presented with right upper lobe lung mass, left upper lobe lung mass as well as loculated left pleural effusion and right hilar and paramediastinal lymphadenopathy in addition to bone lesions C2 and L5, right ribs and right iliac crest diagnosed in March 2022 The patient is currently undergoing systemic chemotherapy with carboplatin for AUC of 5, paclitaxel 175 mg/M2 and Keytruda 200 mg IV every 3 weeks with Neulasta support status post 2 cycles.  Riley Sharp has been tolerating this treatment well with no concerning adverse effect except for few days of nausea and diarrhea after the Neulasta injection. I recommended for the patient to proceed with cycle #3 today as planned. I will see him back for follow-up visit in 3 weeks for evaluation with repeat CT scan of the chest, abdomen  pelvis for restaging of his disease. The patient was advised to call immediately if Riley Sharp has any concerning symptoms in the interval. The patient voices understanding of current disease status and treatment options and is in agreement with the current care plan.  All questions were answered. The patient knows to call the clinic with any problems, questions or concerns. We can certainly see the patient much sooner if necessary.   Disclaimer: This note was dictated with voice recognition software. Similar sounding words can inadvertently be transcribed and may not be corrected upon review.

## 2020-06-16 ENCOUNTER — Telehealth: Payer: Self-pay | Admitting: Pulmonary Disease

## 2020-06-16 ENCOUNTER — Ambulatory Visit
Admission: RE | Admit: 2020-06-16 | Discharge: 2020-06-16 | Disposition: A | Discharge status: Home / Self Care | Payer: Medicare Other | Source: Ambulatory Visit | Attending: Radiation Oncology | Admitting: Radiation Oncology

## 2020-06-16 ENCOUNTER — Other Ambulatory Visit: Payer: Self-pay

## 2020-06-16 DIAGNOSIS — Z87891 Personal history of nicotine dependence: Secondary | ICD-10-CM | POA: Diagnosis not present

## 2020-06-16 DIAGNOSIS — C3411 Malignant neoplasm of upper lobe, right bronchus or lung: Secondary | ICD-10-CM | POA: Diagnosis not present

## 2020-06-16 DIAGNOSIS — C3412 Malignant neoplasm of upper lobe, left bronchus or lung: Secondary | ICD-10-CM | POA: Diagnosis not present

## 2020-06-16 DIAGNOSIS — Z5112 Encounter for antineoplastic immunotherapy: Secondary | ICD-10-CM | POA: Diagnosis not present

## 2020-06-16 DIAGNOSIS — Z452 Encounter for adjustment and management of vascular access device: Secondary | ICD-10-CM | POA: Diagnosis not present

## 2020-06-16 DIAGNOSIS — Z79899 Other long term (current) drug therapy: Secondary | ICD-10-CM | POA: Diagnosis not present

## 2020-06-16 DIAGNOSIS — Z510 Encounter for antineoplastic radiation therapy: Secondary | ICD-10-CM | POA: Diagnosis not present

## 2020-06-16 DIAGNOSIS — Z5111 Encounter for antineoplastic chemotherapy: Secondary | ICD-10-CM | POA: Diagnosis not present

## 2020-06-16 NOTE — Telephone Encounter (Signed)
Called [employer] and spoke to [place].  She states she did not get previous order but she did get order I sent earlier today.  She is processing it now and usually takes 24 hours to run through insurance.  I called pt to make him aware and had to leave a vm for him to call me back.

## 2020-06-16 NOTE — Telephone Encounter (Addendum)
Order was faxed on 5/16 & confirmation was received.  I called [employer] this morning & got answering service.  She is going to give them a message to call me regarding nebulizer order for pt.  I went ahead and refaxed order & included note for them to call me to let me know order has been received.

## 2020-06-16 NOTE — Telephone Encounter (Signed)
Order was placed on 06/13/2020 for nebulizer machine. Patient stated that [employer] has not received ordered.  Spoke to [place] w/[employer], who confirmed that order has not been received.   [facility], please resend order for nebulizer machine. [employer]'s fax number is [phone].

## 2020-06-16 NOTE — Telephone Encounter (Signed)
Spoke to pt & made him aware order was received today and if he doesn't hear from them tomorrow he should call and ask for [place].  Nothing further needed.

## 2020-06-17 ENCOUNTER — Inpatient Hospital Stay: Payer: Medicare Other

## 2020-06-17 ENCOUNTER — Ambulatory Visit
Admission: RE | Admit: 2020-06-17 | Discharge: 2020-06-17 | Disposition: A | Discharge status: Home / Self Care | Payer: Medicare Other | Source: Ambulatory Visit | Attending: Radiation Oncology | Admitting: Radiation Oncology

## 2020-06-17 VITALS — BP 121/57 | HR 67 | Temp 98.6°F | Resp 18

## 2020-06-17 DIAGNOSIS — C3412 Malignant neoplasm of upper lobe, left bronchus or lung: Secondary | ICD-10-CM | POA: Diagnosis not present

## 2020-06-17 DIAGNOSIS — Z5111 Encounter for antineoplastic chemotherapy: Secondary | ICD-10-CM | POA: Diagnosis not present

## 2020-06-17 DIAGNOSIS — Z87891 Personal history of nicotine dependence: Secondary | ICD-10-CM | POA: Diagnosis not present

## 2020-06-17 DIAGNOSIS — Z5112 Encounter for antineoplastic immunotherapy: Secondary | ICD-10-CM | POA: Diagnosis not present

## 2020-06-17 DIAGNOSIS — Z79899 Other long term (current) drug therapy: Secondary | ICD-10-CM | POA: Diagnosis not present

## 2020-06-17 DIAGNOSIS — Z95828 Presence of other vascular implants and grafts: Secondary | ICD-10-CM

## 2020-06-17 DIAGNOSIS — C3491 Malignant neoplasm of unspecified part of right bronchus or lung: Secondary | ICD-10-CM

## 2020-06-17 DIAGNOSIS — Z510 Encounter for antineoplastic radiation therapy: Secondary | ICD-10-CM | POA: Diagnosis not present

## 2020-06-17 DIAGNOSIS — Z452 Encounter for adjustment and management of vascular access device: Secondary | ICD-10-CM | POA: Diagnosis not present

## 2020-06-17 DIAGNOSIS — C3411 Malignant neoplasm of upper lobe, right bronchus or lung: Secondary | ICD-10-CM | POA: Diagnosis not present

## 2020-06-17 MED ORDER — PEGFILGRASTIM-JMDB 6 MG/0.6ML ~~LOC~~ SOSY
PREFILLED_SYRINGE | SUBCUTANEOUS | Status: AC
  Filled 2020-06-17: qty 0.6

## 2020-06-17 MED ORDER — PEGFILGRASTIM-JMDB 6 MG/0.6ML ~~LOC~~ SOSY
6.0000 mg | PREFILLED_SYRINGE | Freq: Once | SUBCUTANEOUS | Status: AC
Start: 2020-06-17 — End: 2020-06-17
  Administered 2020-06-17: 6 mg via SUBCUTANEOUS

## 2020-06-17 NOTE — Patient Instructions (Signed)
Pegfilgrastim injection What is this medicine? PEGFILGRASTIM (PEG fil gra stim) is a long-acting granulocyte colony-stimulating factor that stimulates the growth of neutrophils, a type of white blood cell important in the body's fight against infection. It is used to reduce the incidence of fever and infection in patients with certain types of cancer who are receiving chemotherapy that affects the bone marrow, and to increase survival after being exposed to high doses of radiation. This medicine may be used for other purposes; ask your health care provider or pharmacist if you have questions. COMMON BRAND NAME(S): Fulphila, Neulasta, Nyvepria, UDENYCA, Ziextenzo What should I tell my health care provider before I take this medicine? They need to know if you have any of these conditions:  kidney disease  latex allergy  ongoing radiation therapy  sickle cell disease  skin reactions to acrylic adhesives (On-Body Injector only)  an unusual or allergic reaction to pegfilgrastim, filgrastim, other medicines, foods, dyes, or preservatives  pregnant or trying to get pregnant  breast-feeding How should I use this medicine? This medicine is for injection under the skin. If you get this medicine at home, you will be taught how to prepare and give the pre-filled syringe or how to use the On-body Injector. Refer to the patient Instructions for Use for detailed instructions. Use exactly as directed. Tell your healthcare provider immediately if you suspect that the On-body Injector may not have performed as intended or if you suspect the use of the On-body Injector resulted in a missed or partial dose. It is important that you put your used needles and syringes in a special sharps container. Do not put them in a trash can. If you do not have a sharps container, call your pharmacist or healthcare provider to get one. Talk to your pediatrician regarding the use of this medicine in children. While this drug  may be prescribed for selected conditions, precautions do apply. Overdosage: If you think you have taken too much of this medicine contact a poison control center or emergency room at once. NOTE: This medicine is only for you. Do not share this medicine with others. What if I miss a dose? It is important not to miss your dose. Call your doctor or health care professional if you miss your dose. If you miss a dose due to an On-body Injector failure or leakage, a new dose should be administered as soon as possible using a single prefilled syringe for manual use. What may interact with this medicine? Interactions have not been studied. This list may not describe all possible interactions. Give your health care provider a list of all the medicines, herbs, non-prescription drugs, or dietary supplements you use. Also tell them if you smoke, drink alcohol, or use illegal drugs. Some items may interact with your medicine. What should I watch for while using this medicine? Your condition will be monitored carefully while you are receiving this medicine. You may need blood work done while you are taking this medicine. Talk to your health care provider about your risk of cancer. You may be more at risk for certain types of cancer if you take this medicine. If you are going to need a MRI, CT scan, or other procedure, tell your doctor that you are using this medicine (On-Body Injector only). What side effects may I notice from receiving this medicine? Side effects that you should report to your doctor or health care professional as soon as possible:  allergic reactions (skin rash, itching or hives, swelling of   the face, lips, or tongue)  back pain  dizziness  fever  pain, redness, or irritation at site where injected  pinpoint red spots on the skin  red or dark-brown urine  shortness of breath or breathing problems  stomach or side pain, or pain at the shoulder  swelling  tiredness  trouble  passing urine or change in the amount of urine  unusual bruising or bleeding Side effects that usually do not require medical attention (report to your doctor or health care professional if they continue or are bothersome):  bone pain  muscle pain This list may not describe all possible side effects. Call your doctor for medical advice about side effects. You may report side effects to FDA at 1-800-FDA-1088. Where should I keep my medicine? Keep out of the reach of children. If you are using this medicine at home, you will be instructed on how to store it. Throw away any unused medicine after the expiration date on the label. NOTE: This sheet is a summary. It may not cover all possible information. If you have questions about this medicine, talk to your doctor, pharmacist, or health care provider.  2021 Elsevier/Gold Standard (2019-02-06 13:20:51)  

## 2020-06-20 ENCOUNTER — Other Ambulatory Visit: Payer: Self-pay

## 2020-06-20 ENCOUNTER — Other Ambulatory Visit: Payer: Self-pay | Admitting: Radiation Oncology

## 2020-06-20 ENCOUNTER — Ambulatory Visit
Admission: RE | Admit: 2020-06-20 | Discharge: 2020-06-20 | Disposition: A | Discharge status: Home / Self Care | Payer: Medicare Other | Source: Ambulatory Visit | Attending: Radiation Oncology | Admitting: Radiation Oncology

## 2020-06-20 DIAGNOSIS — Z79899 Other long term (current) drug therapy: Secondary | ICD-10-CM | POA: Diagnosis not present

## 2020-06-20 DIAGNOSIS — Z5111 Encounter for antineoplastic chemotherapy: Secondary | ICD-10-CM | POA: Diagnosis not present

## 2020-06-20 DIAGNOSIS — Z452 Encounter for adjustment and management of vascular access device: Secondary | ICD-10-CM | POA: Diagnosis not present

## 2020-06-20 DIAGNOSIS — Z510 Encounter for antineoplastic radiation therapy: Secondary | ICD-10-CM | POA: Diagnosis not present

## 2020-06-20 DIAGNOSIS — Z5112 Encounter for antineoplastic immunotherapy: Secondary | ICD-10-CM | POA: Diagnosis not present

## 2020-06-20 DIAGNOSIS — Z87891 Personal history of nicotine dependence: Secondary | ICD-10-CM | POA: Diagnosis not present

## 2020-06-20 DIAGNOSIS — C3411 Malignant neoplasm of upper lobe, right bronchus or lung: Secondary | ICD-10-CM | POA: Diagnosis not present

## 2020-06-20 DIAGNOSIS — C3412 Malignant neoplasm of upper lobe, left bronchus or lung: Secondary | ICD-10-CM | POA: Diagnosis not present

## 2020-06-20 MED ORDER — SUCRALFATE 1 G PO TABS: 1.0000 g | ORAL_TABLET | Freq: Three times a day (TID) | ORAL | 2 refills | Status: DC

## 2020-06-21 ENCOUNTER — Ambulatory Visit
Admission: RE | Admit: 2020-06-21 | Discharge: 2020-06-21 | Disposition: A | Discharge status: Home / Self Care | Payer: Medicare Other | Source: Ambulatory Visit | Attending: Radiation Oncology | Admitting: Radiation Oncology

## 2020-06-21 DIAGNOSIS — C3412 Malignant neoplasm of upper lobe, left bronchus or lung: Secondary | ICD-10-CM | POA: Diagnosis not present

## 2020-06-21 DIAGNOSIS — Z5111 Encounter for antineoplastic chemotherapy: Secondary | ICD-10-CM | POA: Diagnosis not present

## 2020-06-21 DIAGNOSIS — C3411 Malignant neoplasm of upper lobe, right bronchus or lung: Secondary | ICD-10-CM | POA: Diagnosis not present

## 2020-06-21 DIAGNOSIS — Z452 Encounter for adjustment and management of vascular access device: Secondary | ICD-10-CM | POA: Diagnosis not present

## 2020-06-21 DIAGNOSIS — Z87891 Personal history of nicotine dependence: Secondary | ICD-10-CM | POA: Diagnosis not present

## 2020-06-21 DIAGNOSIS — Z79899 Other long term (current) drug therapy: Secondary | ICD-10-CM | POA: Diagnosis not present

## 2020-06-21 DIAGNOSIS — Z510 Encounter for antineoplastic radiation therapy: Secondary | ICD-10-CM | POA: Diagnosis not present

## 2020-06-21 DIAGNOSIS — Z5112 Encounter for antineoplastic immunotherapy: Secondary | ICD-10-CM | POA: Diagnosis not present

## 2020-06-22 ENCOUNTER — Other Ambulatory Visit: Payer: Self-pay

## 2020-06-22 ENCOUNTER — Inpatient Hospital Stay: Payer: Medicare Other

## 2020-06-22 ENCOUNTER — Ambulatory Visit
Admission: RE | Admit: 2020-06-22 | Discharge: 2020-06-22 | Disposition: A | Discharge status: Home / Self Care | Payer: Medicare Other | Source: Ambulatory Visit | Attending: Radiation Oncology | Admitting: Radiation Oncology

## 2020-06-22 ENCOUNTER — Other Ambulatory Visit: Payer: Medicare Other

## 2020-06-22 DIAGNOSIS — Z95828 Presence of other vascular implants and grafts: Secondary | ICD-10-CM

## 2020-06-22 DIAGNOSIS — C3411 Malignant neoplasm of upper lobe, right bronchus or lung: Secondary | ICD-10-CM | POA: Diagnosis not present

## 2020-06-22 DIAGNOSIS — Z5112 Encounter for antineoplastic immunotherapy: Secondary | ICD-10-CM | POA: Diagnosis not present

## 2020-06-22 DIAGNOSIS — Z5111 Encounter for antineoplastic chemotherapy: Secondary | ICD-10-CM | POA: Diagnosis not present

## 2020-06-22 DIAGNOSIS — Z87891 Personal history of nicotine dependence: Secondary | ICD-10-CM | POA: Diagnosis not present

## 2020-06-22 DIAGNOSIS — Z510 Encounter for antineoplastic radiation therapy: Secondary | ICD-10-CM | POA: Diagnosis not present

## 2020-06-22 DIAGNOSIS — Z452 Encounter for adjustment and management of vascular access device: Secondary | ICD-10-CM | POA: Diagnosis not present

## 2020-06-22 DIAGNOSIS — C3412 Malignant neoplasm of upper lobe, left bronchus or lung: Secondary | ICD-10-CM | POA: Diagnosis not present

## 2020-06-22 DIAGNOSIS — C3490 Malignant neoplasm of unspecified part of unspecified bronchus or lung: Secondary | ICD-10-CM

## 2020-06-22 DIAGNOSIS — Z79899 Other long term (current) drug therapy: Secondary | ICD-10-CM | POA: Diagnosis not present

## 2020-06-22 LAB — CMP (CANCER CENTER ONLY)
ALT: 13 U/L (ref 0–44)
AST: 14 U/L — ABNORMAL LOW (ref 15–41)
Albumin: 3.3 g/dL — ABNORMAL LOW (ref 3.5–5.0)
Alkaline Phosphatase: 119 U/L (ref 38–126)
Anion gap: 9 (ref 5–15)
BUN: 18 mg/dL (ref 8–23)
CO2: 23 mmol/L (ref 22–32)
Calcium: 9.1 mg/dL (ref 8.9–10.3)
Chloride: 102 mmol/L (ref 98–111)
Creatinine: 0.82 mg/dL (ref 0.61–1.24)
GFR, Estimated: >60 mL/min (ref >60)
Glucose, Bld: 105 mg/dL — ABNORMAL HIGH (ref 70–99)
Potassium: 4.3 mmol/L (ref 3.5–5.1)
Sodium: 134 mmol/L — ABNORMAL LOW (ref 135–145)
Total Bilirubin: 0.4 mg/dL (ref 0.3–1.2)
Total Protein: 6.8 g/dL (ref 6.5–8.1)

## 2020-06-22 LAB — CBC WITH DIFFERENTIAL (CANCER CENTER ONLY)
Abs Immature Granulocytes: 0.16 10*3/uL — ABNORMAL HIGH (ref 0.00–0.07)
Basophils Absolute: 0.1 10*3/uL (ref 0.0–0.1)
Basophils Relative: 1 %
Eosinophils Absolute: 0.1 10*3/uL (ref 0.0–0.5)
Eosinophils Relative: 1 %
HCT: 32.4 % — ABNORMAL LOW (ref 39.0–52.0)
Hemoglobin: 10.9 g/dL — ABNORMAL LOW (ref 13.0–17.0)
Immature Granulocytes: 1 %
Lymphocytes Relative: 3 %
Lymphs Abs: 0.5 10*3/uL — ABNORMAL LOW (ref 0.7–4.0)
MCH: 30.6 pg (ref 26.0–34.0)
MCHC: 33.6 g/dL (ref 30.0–36.0)
MCV: 91 fL (ref 80.0–100.0)
Monocytes Absolute: 0.9 10*3/uL (ref 0.1–1.0)
Monocytes Relative: 6 %
Neutro Abs: 13.1 10*3/uL — ABNORMAL HIGH (ref 1.7–7.7)
Neutrophils Relative %: 88 %
Platelet Count: 130 10*3/uL — ABNORMAL LOW (ref 150–400)
RBC: 3.56 MIL/uL — ABNORMAL LOW (ref 4.22–5.81)
RDW: 15.6 % — ABNORMAL HIGH (ref 11.5–15.5)
WBC Count: 14.9 10*3/uL — ABNORMAL HIGH (ref 4.0–10.5)
nRBC: 0 % (ref 0.0–0.2)

## 2020-06-22 MED ORDER — HEPARIN SOD (PORK) LOCK FLUSH 100 UNIT/ML IV SOLN
500.0000 [IU] | INTRAVENOUS | Status: AC | PRN
  Administered 2020-06-22: 500 [IU]
  Filled 2020-06-22: qty 5

## 2020-06-22 MED ORDER — SODIUM CHLORIDE 0.9% FLUSH
10.0000 mL | INTRAVENOUS | Status: AC | PRN
Start: 2020-06-22 — End: 2020-06-22
  Administered 2020-06-22: 10 mL
  Filled 2020-06-22: qty 10

## 2020-06-23 ENCOUNTER — Ambulatory Visit
Admission: RE | Admit: 2020-06-23 | Discharge: 2020-06-23 | Disposition: A | Discharge status: Home / Self Care | Payer: Medicare Other | Source: Ambulatory Visit | Attending: Radiation Oncology | Admitting: Radiation Oncology

## 2020-06-23 DIAGNOSIS — Z510 Encounter for antineoplastic radiation therapy: Secondary | ICD-10-CM | POA: Diagnosis not present

## 2020-06-23 DIAGNOSIS — C3411 Malignant neoplasm of upper lobe, right bronchus or lung: Secondary | ICD-10-CM | POA: Diagnosis not present

## 2020-06-23 DIAGNOSIS — Z5112 Encounter for antineoplastic immunotherapy: Secondary | ICD-10-CM | POA: Diagnosis not present

## 2020-06-23 DIAGNOSIS — Z5111 Encounter for antineoplastic chemotherapy: Secondary | ICD-10-CM | POA: Diagnosis not present

## 2020-06-23 DIAGNOSIS — C3412 Malignant neoplasm of upper lobe, left bronchus or lung: Secondary | ICD-10-CM | POA: Diagnosis not present

## 2020-06-23 DIAGNOSIS — Z79899 Other long term (current) drug therapy: Secondary | ICD-10-CM | POA: Diagnosis not present

## 2020-06-23 DIAGNOSIS — Z452 Encounter for adjustment and management of vascular access device: Secondary | ICD-10-CM | POA: Diagnosis not present

## 2020-06-23 DIAGNOSIS — Z87891 Personal history of nicotine dependence: Secondary | ICD-10-CM | POA: Diagnosis not present

## 2020-06-24 ENCOUNTER — Ambulatory Visit
Admission: RE | Admit: 2020-06-24 | Discharge: 2020-06-24 | Disposition: A | Discharge status: Home / Self Care | Payer: Medicare Other | Source: Ambulatory Visit | Attending: Radiation Oncology | Admitting: Radiation Oncology

## 2020-06-24 ENCOUNTER — Encounter: Payer: Self-pay | Admitting: Radiation Oncology

## 2020-06-24 ENCOUNTER — Other Ambulatory Visit: Payer: Self-pay

## 2020-06-24 DIAGNOSIS — Z87891 Personal history of nicotine dependence: Secondary | ICD-10-CM | POA: Diagnosis not present

## 2020-06-24 DIAGNOSIS — Z510 Encounter for antineoplastic radiation therapy: Secondary | ICD-10-CM | POA: Diagnosis not present

## 2020-06-24 DIAGNOSIS — Z79899 Other long term (current) drug therapy: Secondary | ICD-10-CM | POA: Diagnosis not present

## 2020-06-24 DIAGNOSIS — Z5112 Encounter for antineoplastic immunotherapy: Secondary | ICD-10-CM | POA: Diagnosis not present

## 2020-06-24 DIAGNOSIS — C3412 Malignant neoplasm of upper lobe, left bronchus or lung: Secondary | ICD-10-CM | POA: Diagnosis not present

## 2020-06-24 DIAGNOSIS — C3411 Malignant neoplasm of upper lobe, right bronchus or lung: Secondary | ICD-10-CM | POA: Diagnosis not present

## 2020-06-24 DIAGNOSIS — Z452 Encounter for adjustment and management of vascular access device: Secondary | ICD-10-CM | POA: Diagnosis not present

## 2020-06-24 DIAGNOSIS — Z5111 Encounter for antineoplastic chemotherapy: Secondary | ICD-10-CM | POA: Diagnosis not present

## 2020-06-29 ENCOUNTER — Inpatient Hospital Stay: Payer: Medicare Other | Attending: Physician Assistant

## 2020-06-29 ENCOUNTER — Other Ambulatory Visit: Payer: Medicare Other

## 2020-06-29 ENCOUNTER — Other Ambulatory Visit: Payer: Self-pay

## 2020-06-29 DIAGNOSIS — R5383 Other fatigue: Secondary | ICD-10-CM

## 2020-06-29 DIAGNOSIS — Z452 Encounter for adjustment and management of vascular access device: Secondary | ICD-10-CM | POA: Insufficient documentation

## 2020-06-29 DIAGNOSIS — Z95828 Presence of other vascular implants and grafts: Secondary | ICD-10-CM

## 2020-06-29 DIAGNOSIS — J90 Pleural effusion, not elsewhere classified: Secondary | ICD-10-CM | POA: Insufficient documentation

## 2020-06-29 DIAGNOSIS — Z5112 Encounter for antineoplastic immunotherapy: Secondary | ICD-10-CM | POA: Diagnosis not present

## 2020-06-29 DIAGNOSIS — C7951 Secondary malignant neoplasm of bone: Secondary | ICD-10-CM | POA: Diagnosis not present

## 2020-06-29 DIAGNOSIS — C3490 Malignant neoplasm of unspecified part of unspecified bronchus or lung: Secondary | ICD-10-CM

## 2020-06-29 DIAGNOSIS — R1310 Dysphagia, unspecified: Secondary | ICD-10-CM | POA: Diagnosis not present

## 2020-06-29 DIAGNOSIS — C3411 Malignant neoplasm of upper lobe, right bronchus or lung: Secondary | ICD-10-CM | POA: Diagnosis not present

## 2020-06-29 DIAGNOSIS — Z5111 Encounter for antineoplastic chemotherapy: Secondary | ICD-10-CM | POA: Diagnosis not present

## 2020-06-29 DIAGNOSIS — Z79899 Other long term (current) drug therapy: Secondary | ICD-10-CM | POA: Insufficient documentation

## 2020-06-29 LAB — CBC WITH DIFFERENTIAL (CANCER CENTER ONLY)
Abs Immature Granulocytes: 0.3 10*3/uL — ABNORMAL HIGH (ref 0.00–0.07)
Basophils Absolute: 0.1 10*3/uL (ref 0.0–0.1)
Basophils Relative: 0 %
Eosinophils Absolute: 0.1 10*3/uL (ref 0.0–0.5)
Eosinophils Relative: 0 %
HCT: 34 % — ABNORMAL LOW (ref 39.0–52.0)
Hemoglobin: 10.8 g/dL — ABNORMAL LOW (ref 13.0–17.0)
Immature Granulocytes: 2 %
Lymphocytes Relative: 3 %
Lymphs Abs: 0.6 10*3/uL — ABNORMAL LOW (ref 0.7–4.0)
MCH: 30.2 pg (ref 26.0–34.0)
MCHC: 31.8 g/dL (ref 30.0–36.0)
MCV: 95 fL (ref 80.0–100.0)
Monocytes Absolute: 0.8 10*3/uL (ref 0.1–1.0)
Monocytes Relative: 4 %
Neutro Abs: 18.8 10*3/uL — ABNORMAL HIGH (ref 1.7–7.7)
Neutrophils Relative %: 91 %
Platelet Count: 150 10*3/uL (ref 150–400)
RBC: 3.58 MIL/uL — ABNORMAL LOW (ref 4.22–5.81)
RDW: 17 % — ABNORMAL HIGH (ref 11.5–15.5)
WBC Count: 20.6 10*3/uL — ABNORMAL HIGH (ref 4.0–10.5)
nRBC: 0 % (ref 0.0–0.2)

## 2020-06-29 LAB — CMP (CANCER CENTER ONLY)
ALT: 11 U/L (ref 0–44)
AST: 14 U/L — ABNORMAL LOW (ref 15–41)
Albumin: 3.1 g/dL — ABNORMAL LOW (ref 3.5–5.0)
Alkaline Phosphatase: 111 U/L (ref 38–126)
Anion gap: 9 (ref 5–15)
BUN: 14 mg/dL (ref 8–23)
CO2: 22 mmol/L (ref 22–32)
Calcium: 8.9 mg/dL (ref 8.9–10.3)
Chloride: 106 mmol/L (ref 98–111)
Creatinine: 0.81 mg/dL (ref 0.61–1.24)
GFR, Estimated: >60 mL/min (ref >60)
Glucose, Bld: 107 mg/dL — ABNORMAL HIGH (ref 70–99)
Potassium: 4.2 mmol/L (ref 3.5–5.1)
Sodium: 137 mmol/L (ref 135–145)
Total Bilirubin: 0.2 mg/dL — ABNORMAL LOW (ref 0.3–1.2)
Total Protein: 6.6 g/dL (ref 6.5–8.1)

## 2020-06-29 LAB — TSH: TSH: 2.433 u[IU]/mL (ref 0.320–4.118)

## 2020-06-29 MED ORDER — SODIUM CHLORIDE 0.9% FLUSH
10.0000 mL | INTRAVENOUS | Status: DC | PRN
  Administered 2020-06-29: 10 mL via INTRAVENOUS
  Filled 2020-06-29: qty 10

## 2020-06-29 MED ORDER — HEPARIN SOD (PORK) LOCK FLUSH 100 UNIT/ML IV SOLN
500.0000 [IU] | Freq: Once | INTRAVENOUS | Status: AC
  Administered 2020-06-29: 500 [IU] via INTRAVENOUS
  Filled 2020-06-29: qty 5

## 2020-07-01 NOTE — Progress Notes (Signed)
[name]  [name], MD [address]  [place] [address]  DIAGNOSIS: Stage IV non-small cell lung cancer, squamous cell carcinoma. He presented with a right upper lobe lung mass, left upper lobe lung mass, and a loculated left pleural effusion. He had mild hypermetabolism in the right hilar and paramediastinal lymphadenopathy. He had a few small bone lesions on C2, L5, right ribs, and right iliac crest. He was diagnosed in March 2022  PD-L1:0%  PRIOR THERAPY: Palliative radiotherapy to the lung for the hemoptysis under the care of Dr. Lisbeth Renshaw.  Last treatment on 06/24/2020.  CURRENT THERAPY: Systemic chemotherapy with carboplatin for an AUC of 5, paclitaxel 175 mg/m2, Keytruda 200 mg IV 3 weeks with Neulasta support.First dose expected on 05/02/2020. Status post 3 cycles.   INTERVAL HISTORY: Riley Sharp 75 y.o. male returns to the clinic today for a follow-up visit accompanied by his wife.  The patient is feeling well today without any concerning complaints. He has a mild rash on his right chest in the region he received radiation. It itches mildly.  He tolerated his last cycle of treatment well except for fatigue and decreased appetite with neulasta.  He also has some mild neuropathy in his right hand which started after cycle #2. He performed cryotherapy at his last infusion.Today, he denies any fever, chills, night sweats, unexplained weight loss.  He denies any chest pain.  The hemoptysis has resolved since completing palliative radiotherapy to this area.  He has some dyspnea on exertion and cough which is a little better.  He denies any nausea or vomiting except the had mild nausea after the neulasta.  He denies any diarrhea or constipation. The patient recently had a restaging CT scan performed.  The patient is here today for evaluation before starting cycle number 4.   MEDICAL HISTORY: Past Medical History:  Diagnosis Date   . AAA (abdominal aortic aneurysm) ([place])   . CAD (coronary artery disease)   . Cancer ([place])    basal cell carcimona right ear and upper left at shoulder  . Glaucoma   . Hyperlipidemia   . Hypertension   . Myocardial infarction ([facility]) 03/2003  . Wears glasses     ALLERGIES:  has No Known Allergies.  MEDICATIONS:  Current Outpatient Medications  Medication Sig Dispense Refill  . acetaminophen (TYLENOL) 500 MG tablet Take 500 mg by mouth every 6 (six) hours as needed.    Marland Kitchen albuterol (PROVENTIL) (2.5 MG/3ML) 0.083% nebulizer solution Take 3 mLs (2.5 mg total) by nebulization every 6 (six) hours as needed for wheezing or shortness of breath. 75 mL 12  . albuterol (VENTOLIN HFA) 108 (90 Base) MCG/ACT inhaler Inhale 2 puffs into the lungs every 6 (six) hours as needed for wheezing or shortness of breath. 8 g 6  . aspirin EC 81 MG tablet Take 81 mg by mouth daily.    . brimonidine (ALPHAGAN) 0.2 % ophthalmic solution Place 1 drop into both eyes 3 (three) times daily.    . dorzolamide (TRUSOPT) 2 % ophthalmic solution Place 1 drop into both eyes 3 (three) times daily.    Marland Kitchen latanoprost (XALATAN) 0.005 % ophthalmic solution Place 1 drop into both eyes at bedtime.    . lidocaine-prilocaine (EMLA) cream Apply 1 application topically as needed. 30 g 2  . loratadine (CLARITIN) 10 MG tablet Take 10 mg by mouth daily.    . metoprolol succinate (TOPROL-XL) 25 MG 24 hr tablet Take  25 mg by mouth daily.    . ondansetron (ZOFRAN) 8 MG tablet Take 1 tablet (8 mg total) by mouth every 8 (eight) hours as needed for nausea or vomiting. 30 tablet 2  . prochlorperazine (COMPAZINE) 10 MG tablet Take 1 tablet (10 mg total) by mouth every 6 (six) hours as needed. 30 tablet 2  . ramipril (ALTACE) 5 MG tablet Take 5 mg by mouth daily.    . rosuvastatin (CRESTOR) 10 MG tablet Take 10 mg by mouth daily.    . sucralfate (CARAFATE) 1 g tablet Take 1 tablet (1 g total) by mouth 4 (four) times daily -  with meals and at  bedtime. Crush 1 tablet in 1 oz water and drink 5 min before meals for radiation induced esophagitis 120 tablet 2  . timolol (TIMOPTIC) 0.5 % ophthalmic solution Place 1 drop into both eyes daily.    . Tiotropium Bromide-Olodaterol 2.5-2.5 MCG/ACT AERS Inhale 2 puffs into the lungs daily. 2 each 0  . Tiotropium Bromide-Olodaterol 2.5-2.5 MCG/ACT AERS Inhale 2 puffs into the lungs daily. 24 g 3   No current facility-administered medications for this visit.    SURGICAL HISTORY:  Past Surgical History:  Procedure Laterality Date  . ABDOMINAL AORTIC ENDOVASCULAR STENT GRAFT N/A 11/14/2015   Procedure: ABDOMINAL AORTIC ENDOVASCULAR STENT GRAFT;  Surgeon: Elam Dutch, MD;  Location: [facility];  Service: Vascular;  Laterality: N/A;  . APPENDECTOMY    . BRONCHIAL BIOPSY  04/18/2020   Procedure: BRONCHIAL BIOPSIES;  Surgeon: [name], DO;  Location: [facility];  Service: Pulmonary;;  . BRONCHIAL BRUSHINGS  04/18/2020   Procedure: BRONCHIAL BRUSHINGS;  Surgeon: [name], DO;  Location: [facility];  Service: Pulmonary;;  . BRONCHIAL NEEDLE ASPIRATION BIOPSY  04/18/2020   Procedure: BRONCHIAL NEEDLE ASPIRATION BIOPSIES;  Surgeon: [name], DO;  Location: [facility];  Service: Pulmonary;;  . BRONCHIAL WASHINGS  04/18/2020   Procedure: BRONCHIAL WASHINGS;  Surgeon: [name], DO;  Location: [facility];  Service: Pulmonary;;  . CHOLECYSTECTOMY  2007   Gall Bladder  . COLONOSCOPY W/ BIOPSIES AND POLYPECTOMY    . EYE SURGERY Bilateral    laser for glaucoma both eyesn x 2  . heart stent  Feb. 14, 2005  . IR IMAGING GUIDED PORT INSERTION  05/03/2020  . THORACENTESIS Left 04/18/2020   Procedure: THORACENTESIS;  Surgeon: [name], DO;  Location: [facility];  Service: Pulmonary;  Laterality: Left;  Marland Kitchen VIDEO BRONCHOSCOPY WITH ENDOBRONCHIAL NAVIGATION Bilateral 04/18/2020   Procedure: VIDEO BRONCHOSCOPY WITH ENDOBRONCHIAL NAVIGATION;  Surgeon: [name], DO;   Location: [facility];  Service: Pulmonary;  Laterality: Bilateral;    REVIEW OF SYSTEMS:   Review of Systems  Constitutional: Negative for appetite change, chills, fatigue, fever and unexpected weight change.  HENT: Negative for mouth sores, nosebleeds, sore throat and trouble swallowing.   Eyes: Negative for eye problems and icterus.  Respiratory: Negative for cough, hemoptysis, shortness of breath and wheezing.   Cardiovascular: Negative for chest pain and leg swelling.  Gastrointestinal: Negative for abdominal pain, constipation, diarrhea, nausea and vomiting.  Genitourinary: Negative for bladder incontinence, difficulty urinating, dysuria, frequency and hematuria.   Musculoskeletal: Negative for back pain, gait problem, neck pain and neck stiffness.  Skin: Mild rash on right chest.   Neurological: Negative for dizziness, extremity weakness, gait problem, headaches, light-headedness and seizures.  Hematological: Negative for adenopathy. Does not bruise/bleed easily.  Psychiatric/Behavioral: Negative for confusion, depression and sleep disturbance. The patient  is not nervous/anxious.     PHYSICAL EXAMINATION:  Blood pressure (!) 108/58, pulse 73, temperature 98 F (36.7 C), temperature source Tympanic, resp. rate 18, height _0  (1.702 m), weight 151 lb (68.5 kg), SpO2 100 %.  ECOG PERFORMANCE STATUS: 1 - Symptomatic but completely ambulatory  Physical Exam  Constitutional: Oriented to person, place, and time and well-developed, well-nourished, and in no distress.   HENT:  Head: Normocephalic and atraumatic.  Mouth/Throat: Oropharynx is clear and moist. No oropharyngeal exudate.  Eyes: Conjunctivae are normal. Right eye exhibits no discharge. Left eye exhibits no discharge. No scleral icterus.  Neck: Normal range of motion. Neck supple.  Cardiovascular: Normal rate, regular rhythm, normal heart sounds and intact distal pulses.   Pulmonary/Chest: Effort normal and breath sounds  normal. No respiratory distress. No wheezes. No rales.  Abdominal: Soft. Bowel sounds are normal. Exhibits no distension and no mass. There is no tenderness.  Musculoskeletal: Normal range of motion. Exhibits no edema.  Lymphadenopathy:    No cervical adenopathy.  Neurological: Alert and oriented to person, place, and time. Exhibits normal muscle tone. Gait normal. Coordination normal.  Skin: Skin is warm and dry. No rash noted. Not diaphoretic. No erythema. No pallor.  Psychiatric: Mood, memory and judgment normal.  Vitals reviewed.  LABORATORY DATA: Lab Results  Component Value Date   WBC 10.4 07/06/2020   HGB 10.6 (L) 07/06/2020   HCT 31.8 (L) 07/06/2020   MCV 93.5 07/06/2020   PLT 291 07/06/2020      Chemistry      Component Value Date/Time   NA 138 07/06/2020 0946   K 4.2 07/06/2020 0946   CL 105 07/06/2020 0946   CO2 24 07/06/2020 0946   BUN 9 07/06/2020 0946   CREATININE 0.81 07/06/2020 0946      Component Value Date/Time   CALCIUM 9.0 07/06/2020 0946   ALKPHOS 87 07/06/2020 0946   AST 14 (L) 07/06/2020 0946   ALT 12 07/06/2020 0946   BILITOT 0.3 07/06/2020 0946       RADIOGRAPHIC STUDIES:  CT Chest W Contrast  Result Date: 07/05/2020 CLINICAL DATA:  Restaging metastatic non-small cell lung cancer. EXAM: CT CHEST, ABDOMEN, AND PELVIS WITH CONTRAST TECHNIQUE: Multidetector CT imaging of the chest, abdomen and pelvis was performed following the standard protocol during bolus administration of intravenous contrast. CONTRAST:  125m OMNIPAQUE IOHEXOL 300 MG/ML  SOLN COMPARISON:  PET-CT 04/20/2020 FINDINGS: CT CHEST FINDINGS Cardiovascular: The heart is normal in size. No pericardial effusion. Stable tortuosity the calcification of the thoracic aorta but focal aneurysm or dissection. Stable coronary artery calcifications. Mediastinum/nodes: Stable appearing 7.5 mm right hilar lymph node which was hypermetabolic PET-CT. Stable 5 mm right paratracheal which was also  hypermetabolic. No new or progressive findings. Lungs/Pleura: Bilateral upper lobe lung masses are stable in size when compared to the prior chest CT from 03/31/2020. The right upper lobe lung mass measures a maximum 4.6 x 3.0 cm and previously measured 4.4 x 3.2 cm. The left upper lobe lung lesion measures approximately 4.5 x 3.3 cm and previously measured 4.4 x 3.8 cm. Stable diffuse left-sided pleural thickening, pleural nodularity pleural effusion. No new pulmonary nodules Stable underlying emphysematous changes and areas of pulmonary scarring. Musculoskeletal: No obvious lytic or sclerotic lesions CT ABDOMEN PELVIS FINDINGS Hepatobiliary: No hepatic lesions or intrahepatic biliary dilatation. The gallbladder is surgically absent. No common bile duct dilatation. Pancreas: No mass, inflammation or ductal dilatation. Spleen: Normal size.  No lesions. Adrenals/Urinary Tract: The adrenal glands  and kidneys are unremarkable and stable. Stable scarring changes involving the lower pole region of the right kidney. The bladder is unremarkable. Stomach/Bowel: The stomach, duodenum, small bowel and colon are grossly normal. Stable advanced descending colon and sigmoid colon diverticulosis. Vascular/Lymphatic: Stable appearing aortoiliac stent graft and 5 cm aortic aneurysm. No mesenteric or retroperitoneal mass or adenopathy. Reproductive: The prostate gland seminal vesicles are unremarkable. Other: No pelvic mass or adenopathy. No free pelvic fluid collections. No inguinal mass or adenopathy. No abdominal wall hernia or subcutaneous lesions. Musculoskeletal: No obvious lytic or sclerotic bone lesions. Suspected bone metastasis seen on the PET scan without definite CT correlate. A hypermetabolic right rib lesion seen on the PET scan appears to be a remote healed rib fracture. IMPRESSION: 1. Stable bilateral upper lobe lung masses and mediastinal/hilar lymph nodes. No new or progressive findings. 2. Stable diffuse  left-sided pleural thickening, pleural nodularity and effusion. 3. Stable emphysematous changes and pulmonary scarring. 4. No findings for abdominal/pelvic metastatic disease. 5. Stable aortoiliac stent graft and 5 cm aortic aneurysm. 6. No obvious lytic or sclerotic bone metastasis. Aortic Atherosclerosis (ICD10-I70.0) and Emphysema (ICD10-J43.9). Electronically Signed   By: Marijo Sanes M.D.   On: 07/05/2020 10:39   CT Abdomen Pelvis W Contrast  Result Date: 07/05/2020 CLINICAL DATA:  Restaging metastatic non-small cell lung cancer. EXAM: CT CHEST, ABDOMEN, AND PELVIS WITH CONTRAST TECHNIQUE: Multidetector CT imaging of the chest, abdomen and pelvis was performed following the standard protocol during bolus administration of intravenous contrast. CONTRAST:  142m OMNIPAQUE IOHEXOL 300 MG/ML  SOLN COMPARISON:  PET-CT 04/20/2020 FINDINGS: CT CHEST FINDINGS Cardiovascular: The heart is normal in size. No pericardial effusion. Stable tortuosity the calcification of the thoracic aorta but focal aneurysm or dissection. Stable coronary artery calcifications. Mediastinum/nodes: Stable appearing 7.5 mm right hilar lymph node which was hypermetabolic PET-CT. Stable 5 mm right paratracheal which was also hypermetabolic. No new or progressive findings. Lungs/Pleura: Bilateral upper lobe lung masses are stable in size when compared to the prior chest CT from 03/31/2020. The right upper lobe lung mass measures a maximum 4.6 x 3.0 cm and previously measured 4.4 x 3.2 cm. The left upper lobe lung lesion measures approximately 4.5 x 3.3 cm and previously measured 4.4 x 3.8 cm. Stable diffuse left-sided pleural thickening, pleural nodularity pleural effusion. No new pulmonary nodules Stable underlying emphysematous changes and areas of pulmonary scarring. Musculoskeletal: No obvious lytic or sclerotic lesions CT ABDOMEN PELVIS FINDINGS Hepatobiliary: No hepatic lesions or intrahepatic biliary dilatation. The gallbladder is  surgically absent. No common bile duct dilatation. Pancreas: No mass, inflammation or ductal dilatation. Spleen: Normal size.  No lesions. Adrenals/Urinary Tract: The adrenal glands and kidneys are unremarkable and stable. Stable scarring changes involving the lower pole region of the right kidney. The bladder is unremarkable. Stomach/Bowel: The stomach, duodenum, small bowel and colon are grossly normal. Stable advanced descending colon and sigmoid colon diverticulosis. Vascular/Lymphatic: Stable appearing aortoiliac stent graft and 5 cm aortic aneurysm. No mesenteric or retroperitoneal mass or adenopathy. Reproductive: The prostate gland seminal vesicles are unremarkable. Other: No pelvic mass or adenopathy. No free pelvic fluid collections. No inguinal mass or adenopathy. No abdominal wall hernia or subcutaneous lesions. Musculoskeletal: No obvious lytic or sclerotic bone lesions. Suspected bone metastasis seen on the PET scan without definite CT correlate. A hypermetabolic right rib lesion seen on the PET scan appears to be a remote healed rib fracture. IMPRESSION: 1. Stable bilateral upper lobe lung masses and mediastinal/hilar lymph nodes. No new or  progressive findings. 2. Stable diffuse left-sided pleural thickening, pleural nodularity and effusion. 3. Stable emphysematous changes and pulmonary scarring. 4. No findings for abdominal/pelvic metastatic disease. 5. Stable aortoiliac stent graft and 5 cm aortic aneurysm. 6. No obvious lytic or sclerotic bone metastasis. Aortic Atherosclerosis (ICD10-I70.0) and Emphysema (ICD10-J43.9). Electronically Signed   By: Marijo Sanes M.D.   On: 07/05/2020 10:39     ASSESSMENT/PLAN:  This isa very pleasant 75 year old Caucasian male diagnosed with stage IV non-small cell lung cancer, squamous cell carcinoma. The patient presented with a right upper lobe lung mass, left upper lobe lung mass, loculated left pleural effusion, mild hypermetabolism in the right hilar  anterior mediastinal lymph nodes, and a few small metastatic bone lesions on C2, L5, right ribs, and right iliac crest. He was diagnosed in March 2022. His PD-L1 expression is 0%.   The patient is currently undergoing systemic chemotherapy with carboplatin for AUC of 5, paclitaxel 175 mg/M2 and Keytruda 200 mg IV every 3 weeks with Neulasta support status post 3 cycles of treatment. He tolerated the first week of the treatment well except for the fatigue and aching pain from the Neulasta injection. He also has mild cough with blood-tinged sputum.  He completed palliative radiotherapy for the hemoptysis under the care of Dr. Lisbeth Renshaw.  This was completed on 06/24/2020.  The patient recently had a restaging CT scan performed.   Dr. Julien Nordmann personally and independently reviewed the scan discussed results with the patient today.  The scan showed stable disease. There was no evidence for disease progression.   Dr. Julien Nordmann recommends he continues on the same treatment at the same dose.  He will proceed with cycle #4 today scheduled.  He will have his repeat MR of the neck next week to monitor the suspicious C2 lesion.   He was advised to use hydrocortisone cream on the mild chest rash if needed for itching.   Dr. Julien Nordmann discussed he can receive his covid booster.   I reviewed the mild peripheral neuropathy with Dr. Julien Nordmann. This is his last cycle of taxol. He will proceed with his last cycle today as scheduled.   The patient was advised to call immediately if he has any concerning symptoms in the interval. The patient voices understanding of current disease status and treatment options and is in agreement with the current care plan. All questions were answered. The patient knows to call the clinic with any problems, questions or concerns. We can certainly see the patient much sooner if necessary    Orders Placed This Encounter  Procedures  . TSH    Standing Status:   Standing    Number of  Occurrences:   20    Standing Expiration Date:   07/06/2021      Tobe Sos Shacola Schussler, PA-C 07/06/20  ADDENDUM: Hematology/Oncology Attending: I had a face-to-face encounter with the patient today.  I reviewed his record, lab and scan and recommended his care plan.  This is a very pleasant 75 years old white male diagnosed with a stage IV non-small cell lung cancer, squamous cell carcinoma in March 2022 status post palliative radiotherapy to the right upper lobe lung mass and mediastinum because of hemoptysis. The patient is currently undergoing systemic chemotherapy with carboplatin, paclitaxel and Keytruda status post 3 cycles.  He has been tolerating his treatment well with no concerning complaints except for mild peripheral neuropathy and fatigue. He had repeat CT scan of the chest, abdomen pelvis performed recently.  I personally and independently  reviewed the scan and discussed the results with the patient and his girlfriend. His scan showed no concerning findings for progression and in general has a stable disease. I recommended for the patient to continue his current treatment and he will proceed with cycle #4 of carboplatin, paclitaxel and Keytruda today. We will see him back for follow-up visit in 3 weeks for evaluation before starting the first cycle of maintenance treatment with single agent Keytruda. The patient was advised to call immediately if he has any other concerning symptoms in the interval.  The total time spent in the appointment was 35 minutes. Disclaimer: This note was dictated with voice recognition software. Similar sounding words can inadvertently be transcribed and may be missed upon review. Eilleen Kempf, MD 07/06/20

## 2020-07-04 ENCOUNTER — Ambulatory Visit (HOSPITAL_COMMUNITY)
Admission: RE | Admit: 2020-07-04 | Discharge: 2020-07-04 | Disposition: A | Discharge status: Home / Self Care | Payer: Medicare Other | Source: Ambulatory Visit | Attending: Internal Medicine | Admitting: Internal Medicine

## 2020-07-04 ENCOUNTER — Other Ambulatory Visit: Payer: Self-pay

## 2020-07-04 DIAGNOSIS — C3490 Malignant neoplasm of unspecified part of unspecified bronchus or lung: Secondary | ICD-10-CM | POA: Diagnosis not present

## 2020-07-04 DIAGNOSIS — J929 Pleural plaque without asbestos: Secondary | ICD-10-CM | POA: Diagnosis not present

## 2020-07-04 DIAGNOSIS — J90 Pleural effusion, not elsewhere classified: Secondary | ICD-10-CM | POA: Diagnosis not present

## 2020-07-04 DIAGNOSIS — C7951 Secondary malignant neoplasm of bone: Secondary | ICD-10-CM | POA: Diagnosis not present

## 2020-07-04 DIAGNOSIS — I712 Thoracic aortic aneurysm, without rupture: Secondary | ICD-10-CM | POA: Diagnosis not present

## 2020-07-04 DIAGNOSIS — K5730 Diverticulosis of large intestine without perforation or abscess without bleeding: Secondary | ICD-10-CM | POA: Diagnosis not present

## 2020-07-04 DIAGNOSIS — I700 Atherosclerosis of aorta: Secondary | ICD-10-CM | POA: Diagnosis not present

## 2020-07-04 DIAGNOSIS — J439 Emphysema, unspecified: Secondary | ICD-10-CM | POA: Diagnosis not present

## 2020-07-04 MED ORDER — IOHEXOL 300 MG/ML  SOLN
100.0000 mL | Freq: Once | INTRAMUSCULAR | Status: AC | PRN
  Administered 2020-07-04: 100 mL via INTRAVENOUS

## 2020-07-04 MED ORDER — SODIUM CHLORIDE (PF) 0.9 % IJ SOLN
INTRAMUSCULAR | Status: AC
  Filled 2020-07-04: qty 50

## 2020-07-05 ENCOUNTER — Other Ambulatory Visit: Payer: Self-pay

## 2020-07-05 ENCOUNTER — Encounter: Payer: Self-pay | Admitting: Internal Medicine

## 2020-07-05 NOTE — Progress Notes (Signed)
  Patient Name: Riley Sharp MRN: [id number] DOB: May 28, 1945 Referring Physician: Curt Bears (Profile Not Attached) Date of Service: 06/24/2020 [facility], [place]                                                        End Of Treatment Note  Diagnoses: C34.12-Malignant neoplasm of upper lobe, left bronchus or lung  Cancer Staging: Stage IV non-small cell lung cancer with bulky disease in the right upper lobe and left upper lobe.  Intent: Palliative  Radiation Treatment Dates: 06/06/2020 through 06/24/2020 Site Technique Total Dose (Gy) Dose per Fx (Gy) Completed Fx Beam Energies  Chest: Chest_Bilat 3D 37.5/37.5 2.5 15/15 6X   Narrative: The patient tolerated radiation therapy relatively well. He did note esophagitis toward the end of treatment and was finding modest relief with carafate.  Plan: The patient will receive a call in about one month from the radiation oncology department. He will continue follow up with Dr. Julien Nordmann as well. We will follow up with an MRI of his C spine as well to follow up on PET scan findings in the C2 position from his scan in March 2022.   ________________________________________________    Carola Rhine, PAC

## 2020-07-06 ENCOUNTER — Inpatient Hospital Stay: Payer: Medicare Other

## 2020-07-06 ENCOUNTER — Encounter: Payer: Self-pay | Admitting: Physician Assistant

## 2020-07-06 ENCOUNTER — Other Ambulatory Visit: Payer: Medicare Other

## 2020-07-06 ENCOUNTER — Other Ambulatory Visit: Payer: Self-pay

## 2020-07-06 ENCOUNTER — Inpatient Hospital Stay (HOSPITAL_BASED_OUTPATIENT_CLINIC_OR_DEPARTMENT_OTHER): Payer: Medicare Other | Admitting: Physician Assistant

## 2020-07-06 VITALS — BP 108/58 | HR 73 | Temp 98.0°F | Resp 18 | Ht 67.0 in | Wt 151.0 lb

## 2020-07-06 DIAGNOSIS — Z452 Encounter for adjustment and management of vascular access device: Secondary | ICD-10-CM | POA: Diagnosis not present

## 2020-07-06 DIAGNOSIS — Z5111 Encounter for antineoplastic chemotherapy: Secondary | ICD-10-CM | POA: Diagnosis not present

## 2020-07-06 DIAGNOSIS — J90 Pleural effusion, not elsewhere classified: Secondary | ICD-10-CM | POA: Diagnosis not present

## 2020-07-06 DIAGNOSIS — C3490 Malignant neoplasm of unspecified part of unspecified bronchus or lung: Secondary | ICD-10-CM

## 2020-07-06 DIAGNOSIS — C3491 Malignant neoplasm of unspecified part of right bronchus or lung: Secondary | ICD-10-CM | POA: Diagnosis not present

## 2020-07-06 DIAGNOSIS — C7951 Secondary malignant neoplasm of bone: Secondary | ICD-10-CM | POA: Diagnosis not present

## 2020-07-06 DIAGNOSIS — Z95828 Presence of other vascular implants and grafts: Secondary | ICD-10-CM

## 2020-07-06 DIAGNOSIS — Z5112 Encounter for antineoplastic immunotherapy: Secondary | ICD-10-CM | POA: Diagnosis not present

## 2020-07-06 DIAGNOSIS — C3411 Malignant neoplasm of upper lobe, right bronchus or lung: Secondary | ICD-10-CM | POA: Diagnosis not present

## 2020-07-06 DIAGNOSIS — R5383 Other fatigue: Secondary | ICD-10-CM

## 2020-07-06 LAB — CMP (CANCER CENTER ONLY)
ALT: 12 U/L (ref 0–44)
AST: 14 U/L — ABNORMAL LOW (ref 15–41)
Albumin: 3 g/dL — ABNORMAL LOW (ref 3.5–5.0)
Alkaline Phosphatase: 87 U/L (ref 38–126)
Anion gap: 9 (ref 5–15)
BUN: 9 mg/dL (ref 8–23)
CO2: 24 mmol/L (ref 22–32)
Calcium: 9 mg/dL (ref 8.9–10.3)
Chloride: 105 mmol/L (ref 98–111)
Creatinine: 0.81 mg/dL (ref 0.61–1.24)
GFR, Estimated: >60 mL/min (ref >60)
Glucose, Bld: 103 mg/dL — ABNORMAL HIGH (ref 70–99)
Potassium: 4.2 mmol/L (ref 3.5–5.1)
Sodium: 138 mmol/L (ref 135–145)
Total Bilirubin: 0.3 mg/dL (ref 0.3–1.2)
Total Protein: 6.5 g/dL (ref 6.5–8.1)

## 2020-07-06 LAB — CBC WITH DIFFERENTIAL (CANCER CENTER ONLY)
Abs Immature Granulocytes: 0.07 10*3/uL (ref 0.00–0.07)
Basophils Absolute: 0 10*3/uL (ref 0.0–0.1)
Basophils Relative: 0 %
Eosinophils Absolute: 0 10*3/uL (ref 0.0–0.5)
Eosinophils Relative: 0 %
HCT: 31.8 % — ABNORMAL LOW (ref 39.0–52.0)
Hemoglobin: 10.6 g/dL — ABNORMAL LOW (ref 13.0–17.0)
Immature Granulocytes: 1 %
Lymphocytes Relative: 4 %
Lymphs Abs: 0.4 10*3/uL — ABNORMAL LOW (ref 0.7–4.0)
MCH: 31.2 pg (ref 26.0–34.0)
MCHC: 33.3 g/dL (ref 30.0–36.0)
MCV: 93.5 fL (ref 80.0–100.0)
Monocytes Absolute: 0.6 10*3/uL (ref 0.1–1.0)
Monocytes Relative: 6 %
Neutro Abs: 9.3 10*3/uL — ABNORMAL HIGH (ref 1.7–7.7)
Neutrophils Relative %: 89 %
Platelet Count: 291 10*3/uL (ref 150–400)
RBC: 3.4 MIL/uL — ABNORMAL LOW (ref 4.22–5.81)
RDW: 17.4 % — ABNORMAL HIGH (ref 11.5–15.5)
WBC Count: 10.4 10*3/uL (ref 4.0–10.5)
nRBC: 0 % (ref 0.0–0.2)

## 2020-07-06 MED ORDER — FAMOTIDINE 20 MG IN NS 100 ML IVPB
20.0000 mg | Freq: Once | INTRAVENOUS | Status: AC
  Administered 2020-07-06: 20 mg via INTRAVENOUS

## 2020-07-06 MED ORDER — FAMOTIDINE 20 MG IN NS 100 ML IVPB
INTRAVENOUS | Status: AC
  Filled 2020-07-06: qty 100

## 2020-07-06 MED ORDER — HEPARIN SOD (PORK) LOCK FLUSH 100 UNIT/ML IV SOLN
500.0000 [IU] | Freq: Once | INTRAVENOUS | Status: AC | PRN
  Administered 2020-07-06: 500 [IU]
  Filled 2020-07-06: qty 5

## 2020-07-06 MED ORDER — SODIUM CHLORIDE 0.9 % IV SOLN
175.0000 mg/m2 | Freq: Once | INTRAVENOUS | Status: AC
  Administered 2020-07-06: 318 mg via INTRAVENOUS
  Filled 2020-07-06: qty 53

## 2020-07-06 MED ORDER — SODIUM CHLORIDE 0.9 % IV SOLN
150.0000 mg | Freq: Once | INTRAVENOUS | Status: AC
  Administered 2020-07-06: 150 mg via INTRAVENOUS
  Filled 2020-07-06: qty 150

## 2020-07-06 MED ORDER — PALONOSETRON HCL INJECTION 0.25 MG/5ML
0.2500 mg | Freq: Once | INTRAVENOUS | Status: AC
  Administered 2020-07-06: 0.25 mg via INTRAVENOUS

## 2020-07-06 MED ORDER — SODIUM CHLORIDE 0.9 % IV SOLN
200.0000 mg | Freq: Once | INTRAVENOUS | Status: AC
  Administered 2020-07-06: 200 mg via INTRAVENOUS
  Filled 2020-07-06: qty 8

## 2020-07-06 MED ORDER — DIPHENHYDRAMINE HCL 50 MG/ML IJ SOLN
INTRAMUSCULAR | Status: AC
  Filled 2020-07-06: qty 1

## 2020-07-06 MED ORDER — SODIUM CHLORIDE 0.9% FLUSH
10.0000 mL | INTRAVENOUS | Status: DC | PRN
  Administered 2020-07-06: 10 mL
  Filled 2020-07-06: qty 10

## 2020-07-06 MED ORDER — SODIUM CHLORIDE 0.9 % IV SOLN
434.5000 mg | Freq: Once | INTRAVENOUS | Status: AC
  Administered 2020-07-06: 430 mg via INTRAVENOUS
  Filled 2020-07-06: qty 43

## 2020-07-06 MED ORDER — PALONOSETRON HCL INJECTION 0.25 MG/5ML
INTRAVENOUS | Status: AC
  Filled 2020-07-06: qty 5

## 2020-07-06 MED ORDER — SODIUM CHLORIDE 0.9 % IV SOLN
10.0000 mg | Freq: Once | INTRAVENOUS | Status: AC
  Administered 2020-07-06: 10 mg via INTRAVENOUS
  Filled 2020-07-06: qty 10

## 2020-07-06 MED ORDER — DIPHENHYDRAMINE HCL 50 MG/ML IJ SOLN
50.0000 mg | Freq: Once | INTRAMUSCULAR | Status: AC
  Administered 2020-07-06: 50 mg via INTRAVENOUS

## 2020-07-06 MED ORDER — SODIUM CHLORIDE 0.9% FLUSH
10.0000 mL | INTRAVENOUS | Status: DC | PRN
  Administered 2020-07-06: 10 mL via INTRAVENOUS
  Filled 2020-07-06: qty 10

## 2020-07-06 MED ORDER — SODIUM CHLORIDE 0.9 % IV SOLN
Freq: Once | INTRAVENOUS | Status: AC
Start: 2020-07-06 — End: 2020-07-06
  Filled 2020-07-06: qty 250

## 2020-07-06 NOTE — Patient Instructions (Signed)
[facility]  Discharge Instructions: Thank you for choosing [facility] to provide your oncology and hematology care.   If you have a lab appointment with the [facility], please go directly to the [facility] and check in at the registration area.   Wear comfortable clothing and clothing appropriate for easy access to any Portacath or PICC line.   We strive to give you quality time with your provider. You may need to reschedule your appointment if you arrive late (15 or more minutes).  Arriving late affects you and other patients whose appointments are after yours.  Also, if you miss three or more appointments without notifying the office, you may be dismissed from the clinic at the provider's discretion.      For prescription refill requests, have your pharmacy contact our office and allow 72 hours for refills to be completed.    Today you received the following chemotherapy and/or immunotherapy agents: Keytruda, Taxol, Carboplatin     To help prevent nausea and vomiting after your treatment, we encourage you to take your nausea medication as directed.  BELOW ARE SYMPTOMS THAT SHOULD BE REPORTED IMMEDIATELY: . *FEVER GREATER THAN 100.4 F (38 C) OR HIGHER . *CHILLS OR SWEATING . *NAUSEA AND VOMITING THAT IS NOT CONTROLLED WITH YOUR NAUSEA MEDICATION . *UNUSUAL SHORTNESS OF BREATH . *UNUSUAL BRUISING OR BLEEDING . *URINARY PROBLEMS (pain or burning when urinating, or frequent urination) . *BOWEL PROBLEMS (unusual diarrhea, constipation, pain near the anus) . TENDERNESS IN MOUTH AND THROAT WITH OR WITHOUT PRESENCE OF ULCERS (sore throat, sores in mouth, or a toothache) . UNUSUAL RASH, SWELLING OR PAIN  . UNUSUAL VAGINAL DISCHARGE OR ITCHING   Items with * indicate a potential emergency and should be followed up as soon as possible or go to the Emergency Department if any problems should occur.  Please show the CHEMOTHERAPY ALERT CARD or  IMMUNOTHERAPY ALERT CARD at check-in to the Emergency Department and triage nurse.  Should you have questions after your visit or need to cancel or reschedule your appointment, please contact [facility]  Dept: [phone]  and follow the prompts.  Office hours are 8:00 a.m. to 4:30 p.m. Monday - Friday. Please note that voicemails left after 4:00 p.m. may not be returned until the following business day.  We are closed weekends and major holidays. You have access to a nurse at all times for urgent questions. Please call the main number to the clinic Dept: [phone] and follow the prompts.   For any non-urgent questions, you may also contact your provider using MyChart. We now offer e-Visits for anyone 36 and older to request care online for non-urgent symptoms. For details visit [email or web address].   Also download the MyChart app! Go to the app store, search "MyChart", open the app, select [facility], and log in with your MyChart username and password.  Due to Covid, a mask is required upon entering the hospital/clinic. If you do not have a mask, one will be given to you upon arrival. For doctor visits, patients may have 1 support person aged 5 or older with them. For treatment visits, patients cannot have anyone with them due to current Covid guidelines and our immunocompromised population.

## 2020-07-08 ENCOUNTER — Other Ambulatory Visit: Payer: Self-pay

## 2020-07-08 ENCOUNTER — Inpatient Hospital Stay: Payer: Medicare Other

## 2020-07-08 VITALS — BP 110/64 | HR 71 | Temp 98.6°F | Resp 16

## 2020-07-08 DIAGNOSIS — C3491 Malignant neoplasm of unspecified part of right bronchus or lung: Secondary | ICD-10-CM

## 2020-07-08 DIAGNOSIS — J90 Pleural effusion, not elsewhere classified: Secondary | ICD-10-CM | POA: Diagnosis not present

## 2020-07-08 DIAGNOSIS — Z95828 Presence of other vascular implants and grafts: Secondary | ICD-10-CM

## 2020-07-08 DIAGNOSIS — C3411 Malignant neoplasm of upper lobe, right bronchus or lung: Secondary | ICD-10-CM | POA: Diagnosis not present

## 2020-07-08 DIAGNOSIS — Z5112 Encounter for antineoplastic immunotherapy: Secondary | ICD-10-CM | POA: Diagnosis not present

## 2020-07-08 DIAGNOSIS — C7951 Secondary malignant neoplasm of bone: Secondary | ICD-10-CM | POA: Diagnosis not present

## 2020-07-08 DIAGNOSIS — Z5111 Encounter for antineoplastic chemotherapy: Secondary | ICD-10-CM | POA: Diagnosis not present

## 2020-07-08 DIAGNOSIS — Z452 Encounter for adjustment and management of vascular access device: Secondary | ICD-10-CM | POA: Diagnosis not present

## 2020-07-08 MED ORDER — PEGFILGRASTIM-JMDB 6 MG/0.6ML ~~LOC~~ SOSY
PREFILLED_SYRINGE | SUBCUTANEOUS | Status: AC
  Filled 2020-07-08: qty 0.6

## 2020-07-08 MED ORDER — PEGFILGRASTIM-JMDB 6 MG/0.6ML ~~LOC~~ SOSY
6.0000 mg | PREFILLED_SYRINGE | Freq: Once | SUBCUTANEOUS | Status: AC
  Administered 2020-07-08: 6 mg via SUBCUTANEOUS

## 2020-07-08 NOTE — Patient Instructions (Signed)

## 2020-07-13 ENCOUNTER — Inpatient Hospital Stay: Payer: Medicare Other

## 2020-07-13 ENCOUNTER — Ambulatory Visit (HOSPITAL_COMMUNITY)
Admission: RE | Admit: 2020-07-13 | Discharge: 2020-07-13 | Disposition: A | Discharge status: Home / Self Care | Payer: Medicare Other | Source: Ambulatory Visit | Attending: Physician Assistant | Admitting: Physician Assistant

## 2020-07-13 ENCOUNTER — Other Ambulatory Visit: Payer: Self-pay

## 2020-07-13 DIAGNOSIS — R5383 Other fatigue: Secondary | ICD-10-CM

## 2020-07-13 DIAGNOSIS — Z5111 Encounter for antineoplastic chemotherapy: Secondary | ICD-10-CM | POA: Diagnosis not present

## 2020-07-13 DIAGNOSIS — C3490 Malignant neoplasm of unspecified part of unspecified bronchus or lung: Secondary | ICD-10-CM | POA: Insufficient documentation

## 2020-07-13 DIAGNOSIS — J90 Pleural effusion, not elsewhere classified: Secondary | ICD-10-CM | POA: Diagnosis not present

## 2020-07-13 DIAGNOSIS — M47812 Spondylosis without myelopathy or radiculopathy, cervical region: Secondary | ICD-10-CM | POA: Diagnosis not present

## 2020-07-13 DIAGNOSIS — Z5112 Encounter for antineoplastic immunotherapy: Secondary | ICD-10-CM | POA: Diagnosis not present

## 2020-07-13 DIAGNOSIS — C3411 Malignant neoplasm of upper lobe, right bronchus or lung: Secondary | ICD-10-CM | POA: Diagnosis not present

## 2020-07-13 DIAGNOSIS — C3491 Malignant neoplasm of unspecified part of right bronchus or lung: Secondary | ICD-10-CM

## 2020-07-13 DIAGNOSIS — M4802 Spinal stenosis, cervical region: Secondary | ICD-10-CM | POA: Diagnosis not present

## 2020-07-13 DIAGNOSIS — M4319 Spondylolisthesis, multiple sites in spine: Secondary | ICD-10-CM | POA: Diagnosis not present

## 2020-07-13 DIAGNOSIS — Z95828 Presence of other vascular implants and grafts: Secondary | ICD-10-CM

## 2020-07-13 DIAGNOSIS — C7951 Secondary malignant neoplasm of bone: Secondary | ICD-10-CM | POA: Diagnosis not present

## 2020-07-13 DIAGNOSIS — Z452 Encounter for adjustment and management of vascular access device: Secondary | ICD-10-CM | POA: Diagnosis not present

## 2020-07-13 DIAGNOSIS — Z85118 Personal history of other malignant neoplasm of bronchus and lung: Secondary | ICD-10-CM | POA: Diagnosis not present

## 2020-07-13 LAB — TSH: TSH: 2.559 u[IU]/mL (ref 0.320–4.118)

## 2020-07-13 LAB — CBC WITH DIFFERENTIAL (CANCER CENTER ONLY)
Abs Immature Granulocytes: 0.29 10*3/uL — ABNORMAL HIGH (ref 0.00–0.07)
Basophils Absolute: 0.1 10*3/uL (ref 0.0–0.1)
Basophils Relative: 1 %
Eosinophils Absolute: 0.1 10*3/uL (ref 0.0–0.5)
Eosinophils Relative: 0 %
HCT: 34.3 % — ABNORMAL LOW (ref 39.0–52.0)
Hemoglobin: 11.2 g/dL — ABNORMAL LOW (ref 13.0–17.0)
Immature Granulocytes: 1 %
Lymphocytes Relative: 3 %
Lymphs Abs: 0.5 10*3/uL — ABNORMAL LOW (ref 0.7–4.0)
MCH: 30.7 pg (ref 26.0–34.0)
MCHC: 32.7 g/dL (ref 30.0–36.0)
MCV: 94 fL (ref 80.0–100.0)
Monocytes Absolute: 1.3 10*3/uL — ABNORMAL HIGH (ref 0.1–1.0)
Monocytes Relative: 6 %
Neutro Abs: 17.8 10*3/uL — ABNORMAL HIGH (ref 1.7–7.7)
Neutrophils Relative %: 89 %
Platelet Count: 152 10*3/uL (ref 150–400)
RBC: 3.65 MIL/uL — ABNORMAL LOW (ref 4.22–5.81)
RDW: 17.1 % — ABNORMAL HIGH (ref 11.5–15.5)
WBC Count: 20 10*3/uL — ABNORMAL HIGH (ref 4.0–10.5)
nRBC: 0 % (ref 0.0–0.2)

## 2020-07-13 LAB — CMP (CANCER CENTER ONLY)
ALT: 15 U/L (ref 0–44)
AST: 20 U/L (ref 15–41)
Albumin: 3.3 g/dL — ABNORMAL LOW (ref 3.5–5.0)
Alkaline Phosphatase: 130 U/L — ABNORMAL HIGH (ref 38–126)
Anion gap: 7 (ref 5–15)
BUN: 15 mg/dL (ref 8–23)
CO2: 23 mmol/L (ref 22–32)
Calcium: 9 mg/dL (ref 8.9–10.3)
Chloride: 105 mmol/L (ref 98–111)
Creatinine: 0.9 mg/dL (ref 0.61–1.24)
GFR, Estimated: >60 mL/min (ref >60)
Glucose, Bld: 100 mg/dL — ABNORMAL HIGH (ref 70–99)
Potassium: 4.7 mmol/L (ref 3.5–5.1)
Sodium: 135 mmol/L (ref 135–145)
Total Bilirubin: 0.3 mg/dL (ref 0.3–1.2)
Total Protein: 6.8 g/dL (ref 6.5–8.1)

## 2020-07-13 MED ORDER — SODIUM CHLORIDE 0.9% FLUSH
10.0000 mL | INTRAVENOUS | Status: AC | PRN
  Administered 2020-07-13: 10 mL
  Filled 2020-07-13: qty 10

## 2020-07-13 MED ORDER — GADOBUTROL 1 MMOL/ML IV SOLN
7.0000 mL | Freq: Once | INTRAVENOUS | Status: AC | PRN
  Administered 2020-07-13: 7 mL via INTRAVENOUS

## 2020-07-13 MED ORDER — HEPARIN SOD (PORK) LOCK FLUSH 100 UNIT/ML IV SOLN
500.0000 [IU] | INTRAVENOUS | Status: AC | PRN
  Administered 2020-07-13: 500 [IU]
  Filled 2020-07-13: qty 5

## 2020-07-19 DIAGNOSIS — Z23 Encounter for immunization: Secondary | ICD-10-CM | POA: Diagnosis not present

## 2020-07-20 ENCOUNTER — Inpatient Hospital Stay: Payer: Medicare Other

## 2020-07-20 ENCOUNTER — Other Ambulatory Visit: Payer: Self-pay

## 2020-07-20 DIAGNOSIS — C3490 Malignant neoplasm of unspecified part of unspecified bronchus or lung: Secondary | ICD-10-CM

## 2020-07-20 DIAGNOSIS — C3411 Malignant neoplasm of upper lobe, right bronchus or lung: Secondary | ICD-10-CM | POA: Diagnosis not present

## 2020-07-20 DIAGNOSIS — J90 Pleural effusion, not elsewhere classified: Secondary | ICD-10-CM | POA: Diagnosis not present

## 2020-07-20 DIAGNOSIS — Z5112 Encounter for antineoplastic immunotherapy: Secondary | ICD-10-CM | POA: Diagnosis not present

## 2020-07-20 DIAGNOSIS — Z95828 Presence of other vascular implants and grafts: Secondary | ICD-10-CM

## 2020-07-20 DIAGNOSIS — Z452 Encounter for adjustment and management of vascular access device: Secondary | ICD-10-CM | POA: Diagnosis not present

## 2020-07-20 DIAGNOSIS — Z5111 Encounter for antineoplastic chemotherapy: Secondary | ICD-10-CM | POA: Diagnosis not present

## 2020-07-20 DIAGNOSIS — C7951 Secondary malignant neoplasm of bone: Secondary | ICD-10-CM | POA: Diagnosis not present

## 2020-07-20 LAB — CBC WITH DIFFERENTIAL (CANCER CENTER ONLY)
Abs Immature Granulocytes: 0.3 10*3/uL — ABNORMAL HIGH (ref 0.00–0.07)
Basophils Absolute: 0.1 10*3/uL (ref 0.0–0.1)
Basophils Relative: 0 %
Eosinophils Absolute: 0.1 10*3/uL (ref 0.0–0.5)
Eosinophils Relative: 0 %
HCT: 34.6 % — ABNORMAL LOW (ref 39.0–52.0)
Hemoglobin: 11.5 g/dL — ABNORMAL LOW (ref 13.0–17.0)
Immature Granulocytes: 1 %
Lymphocytes Relative: 3 %
Lymphs Abs: 0.5 10*3/uL — ABNORMAL LOW (ref 0.7–4.0)
MCH: 31.7 pg (ref 26.0–34.0)
MCHC: 33.2 g/dL (ref 30.0–36.0)
MCV: 95.3 fL (ref 80.0–100.0)
Monocytes Absolute: 0.9 10*3/uL (ref 0.1–1.0)
Monocytes Relative: 4 %
Neutro Abs: 19.1 10*3/uL — ABNORMAL HIGH (ref 1.7–7.7)
Neutrophils Relative %: 92 %
Platelet Count: 163 10*3/uL (ref 150–400)
RBC: 3.63 MIL/uL — ABNORMAL LOW (ref 4.22–5.81)
RDW: 16.9 % — ABNORMAL HIGH (ref 11.5–15.5)
WBC Count: 20.9 10*3/uL — ABNORMAL HIGH (ref 4.0–10.5)
nRBC: 0 % (ref 0.0–0.2)

## 2020-07-20 LAB — CMP (CANCER CENTER ONLY)
ALT: 14 U/L (ref 0–44)
AST: 17 U/L (ref 15–41)
Albumin: 3.2 g/dL — ABNORMAL LOW (ref 3.5–5.0)
Alkaline Phosphatase: 140 U/L — ABNORMAL HIGH (ref 38–126)
Anion gap: 7 (ref 5–15)
BUN: 11 mg/dL (ref 8–23)
CO2: 22 mmol/L (ref 22–32)
Calcium: 8.8 mg/dL — ABNORMAL LOW (ref 8.9–10.3)
Chloride: 106 mmol/L (ref 98–111)
Creatinine: 0.89 mg/dL (ref 0.61–1.24)
GFR, Estimated: >60 mL/min (ref >60)
Glucose, Bld: 110 mg/dL — ABNORMAL HIGH (ref 70–99)
Potassium: 4.3 mmol/L (ref 3.5–5.1)
Sodium: 135 mmol/L (ref 135–145)
Total Bilirubin: 0.3 mg/dL (ref 0.3–1.2)
Total Protein: 6.7 g/dL (ref 6.5–8.1)

## 2020-07-20 MED ORDER — HEPARIN SOD (PORK) LOCK FLUSH 100 UNIT/ML IV SOLN
500.0000 [IU] | INTRAVENOUS | Status: AC | PRN
  Administered 2020-07-20: 500 [IU]
  Filled 2020-07-20: qty 5

## 2020-07-20 MED ORDER — SODIUM CHLORIDE 0.9% FLUSH
10.0000 mL | INTRAVENOUS | Status: AC | PRN
Start: 2020-07-20 — End: 2020-07-20
  Administered 2020-07-20: 10 mL
  Filled 2020-07-20: qty 10

## 2020-07-21 ENCOUNTER — Other Ambulatory Visit: Payer: Self-pay | Admitting: Radiation Oncology

## 2020-07-21 MED ORDER — NYSTATIN 100000 UNIT/ML MT SUSP: 5.0000 mL | Freq: Four times a day (QID) | OROMUCOSAL | 0 refills | Status: DC

## 2020-07-21 MED ORDER — HYDROCODONE-ACETAMINOPHEN 7.5-325 MG/15ML PO SOLN: 10.0000 mL | Freq: Four times a day (QID) | ORAL | 0 refills | Status: DC | PRN

## 2020-07-21 NOTE — Progress Notes (Signed)
[facility] OFFICE PROGRESS NOTE  [name], MD [address]  [place] [address]  DIAGNOSIS: Stage IV non-small cell lung cancer, squamous cell carcinoma.  He presented with a right upper lobe lung mass, left upper lobe lung mass, and a loculated left pleural effusion.  He had mild hypermetabolism in the right hilar and paramediastinal lymphadenopathy.  He had a few small bone lesions on C2, L5, right ribs, and right iliac crest.  He was diagnosed in March 2022   PD-L1: 0%  PRIOR THERAPY: Palliative radiotherapy to the lung for the hemoptysis under the care of Dr. Lisbeth Renshaw.  Last treatment on 06/24/2020.  CURRENT THERAPY: Systemic chemotherapy with carboplatin for an AUC of 5, paclitaxel 175 mg/m2, Keytruda 200 mg IV 3 weeks with Neulasta support.  First dose expected on 05/02/2020. Status post 4 cycles. Starting from cycle #5 he will be on single agent maintenance Keytruda.   INTERVAL HISTORY: Riley Sharp 75 y.o. male returns to the clinic today for a follow-up visit. The patient is feeling fairly well today without any concerning complaints except he had some dysphagia and odynophagia recently. He recently got a prescription for Hycet due to pain with swallowing and nystatin for thrush from radiation oncology. He did not use much of the hycet and completed the prescription for nystatin. He started noticing improvement after using Mylanta. He does have a gastroenterologist, if needed, but the patient feels like his symptoms have improved and does not need further evaluation at this time.    At his last appointment, the patient was endorsing fatigue, decreased appetite and the beginning of mild peripheral neuropathy (R>L hand).  This was his last cycle of chemotherapy and immunotherapy. Of note, the patient had a repeat MRI of the cervical spine performed recently which did not show the questionable C2 spinal lesion that the initial staging PET scan had mentioned  but it showed severe cervical spinal stenosis on the right >L which may be where his numbness and tingling in his hands/fingers is derived from.   Starting from today, the patient will begin single agent maintenance immunotherapy with Keytruda.  He denies any fever, chills, or night sweats. He states is appetite has been good but he lost a few pounds. He denies any more hemoptysis.  He denies any chest pain.  He reports dyspnea on exertion and a cough are stable. He denies nausea/vomiting except if he coughs up phlegm from his sinus drainage it triggers emesis intermittently. He denies any diarrhea. He sometimes has mild constipation and will use a laxative. He denies any headache or visual changes.  He denies any rashes or skin changes.  The patient is here today for evaluation before starting cycle #5.  MEDICAL HISTORY: Past Medical History:  Diagnosis Date   AAA (abdominal aortic aneurysm) (HCC)    CAD (coronary artery disease)    Cancer ([facility])    basal cell carcimona right ear and upper left at shoulder   Glaucoma    Hyperlipidemia    Hypertension    Myocardial infarction ([place]) 03/2003   Wears glasses     ALLERGIES:  has No Known Allergies.  MEDICATIONS:  Current Outpatient Medications  Medication Sig Dispense Refill   HYDROcodone-acetaminophen (HYCET) 7.5-325 mg/15 ml solution Take 10 mLs by mouth every 6 (six) hours as needed for moderate pain. 473 mL 0   albuterol (PROVENTIL) (2.5 MG/3ML) 0.083% nebulizer solution Take 3 mLs (2.5 mg total) by nebulization every 6 (six) hours  as needed for wheezing or shortness of breath. 75 mL 12   albuterol (VENTOLIN HFA) 108 (90 Base) MCG/ACT inhaler Inhale 2 puffs into the lungs every 6 (six) hours as needed for wheezing or shortness of breath. 8 g 6   aspirin EC 81 MG tablet Take 81 mg by mouth daily.     brimonidine (ALPHAGAN) 0.2 % ophthalmic solution Place 1 drop into both eyes 3 (three) times daily.     dorzolamide (TRUSOPT) 2 % ophthalmic  solution Place 1 drop into both eyes 3 (three) times daily.     latanoprost (XALATAN) 0.005 % ophthalmic solution Place 1 drop into both eyes at bedtime.     lidocaine-prilocaine (EMLA) cream Apply 1 application topically as needed. 30 g 2   loratadine (CLARITIN) 10 MG tablet Take 10 mg by mouth daily.     metoprolol succinate (TOPROL-XL) 25 MG 24 hr tablet Take 25 mg by mouth daily.     ondansetron (ZOFRAN) 8 MG tablet Take 1 tablet (8 mg total) by mouth every 8 (eight) hours as needed for nausea or vomiting. 30 tablet 2   prochlorperazine (COMPAZINE) 10 MG tablet Take 1 tablet (10 mg total) by mouth every 6 (six) hours as needed. 30 tablet 2   ramipril (ALTACE) 5 MG tablet Take 5 mg by mouth daily.     rosuvastatin (CRESTOR) 10 MG tablet Take 10 mg by mouth daily.     timolol (TIMOPTIC) 0.5 % ophthalmic solution Place 1 drop into both eyes daily.     Tiotropium Bromide-Olodaterol 2.5-2.5 MCG/ACT AERS Inhale 2 puffs into the lungs daily. 2 each 0   Tiotropium Bromide-Olodaterol 2.5-2.5 MCG/ACT AERS Inhale 2 puffs into the lungs daily. 24 g 3   No current facility-administered medications for this visit.   Facility-Administered Medications Ordered in Other Visits  Medication Dose Route Frequency Provider Last Rate Last Admin   0.9 %  sodium chloride infusion   Intravenous Once Curt Bears, MD       heparin lock flush 100 unit/mL  500 Units Intracatheter Once PRN Curt Bears, MD       pembrolizumab [facility]) 200 mg in sodium chloride 0.9 % 50 mL chemo infusion  200 mg Intravenous Once Curt Bears, MD       sodium chloride flush (NS) 0.9 % injection 10 mL  10 mL Intracatheter PRN Curt Bears, MD        SURGICAL HISTORY:  Past Surgical History:  Procedure Laterality Date   ABDOMINAL AORTIC ENDOVASCULAR STENT GRAFT N/A 11/14/2015   Procedure: ABDOMINAL AORTIC ENDOVASCULAR STENT GRAFT;  Surgeon: Elam Dutch, MD;  Location: [facility];  Service: Vascular;  Laterality: N/A;    APPENDECTOMY     BRONCHIAL BIOPSY  04/18/2020   Procedure: BRONCHIAL BIOPSIES;  Surgeon: [name], DO;  Location: [facility];  Service: Pulmonary;;   BRONCHIAL BRUSHINGS  04/18/2020   Procedure: BRONCHIAL BRUSHINGS;  Surgeon: [name], DO;  Location: [facility];  Service: Pulmonary;;   BRONCHIAL NEEDLE ASPIRATION BIOPSY  04/18/2020   Procedure: BRONCHIAL NEEDLE ASPIRATION BIOPSIES;  Surgeon: [name], DO;  Location: [facility];  Service: Pulmonary;;   BRONCHIAL WASHINGS  04/18/2020   Procedure: BRONCHIAL WASHINGS;  Surgeon: [name], DO;  Location: [facility];  Service: Pulmonary;;   CHOLECYSTECTOMY  2007   Gall Bladder   COLONOSCOPY W/ BIOPSIES AND POLYPECTOMY     EYE SURGERY Bilateral    laser for glaucoma both eyesn x 2   heart  stent  Feb. 14, 2005   IR IMAGING GUIDED PORT INSERTION  05/03/2020   THORACENTESIS Left 04/18/2020   Procedure: THORACENTESIS;  Surgeon: [name], DO;  Location: [facility];  Service: Pulmonary;  Laterality: Left;   VIDEO BRONCHOSCOPY WITH ENDOBRONCHIAL NAVIGATION Bilateral 04/18/2020   Procedure: VIDEO BRONCHOSCOPY WITH ENDOBRONCHIAL NAVIGATION;  Surgeon: [name], DO;  Location: [facility];  Service: Pulmonary;  Laterality: Bilateral;    REVIEW OF SYSTEMS:   Review of Systems  Constitutional: Negative for appetite change, chills, fatigue, fever and unexpected weight change.  HENT: Positive for (improved) but odynophagia/dysphagia. Negative for mouth sores, nosebleeds, sore throat  Eyes: Negative for eye problems and icterus.  Respiratory: Positive for baseline dyspnea on exertion and cough. Negative for hemoptysis and wheezing.   Cardiovascular: Negative for chest pain and leg swelling.  Gastrointestinal: Positive for nausea/vomiting if coughing spell and mild constipation. Negative for abdominal pain and diarrhea. Genitourinary: Negative for bladder incontinence, difficulty urinating, dysuria,  frequency and hematuria.   Musculoskeletal: Negative for back pain, gait problem, neck pain and neck stiffness.  Skin: Negative for itching and rash.  Neurological: Negative for dizziness, extremity weakness, gait problem, headaches, light-headedness and seizures.  Hematological: Negative for adenopathy. Does not bruise/bleed easily.  Psychiatric/Behavioral: Negative for confusion, depression and sleep disturbance. The patient is not nervous/anxious.     PHYSICAL EXAMINATION:  Blood pressure 120/64, pulse 83, temperature 98.2 F (36.8 C), temperature source Tympanic, resp. rate 18, height _0  (1.702 m), weight 148 lb 1.6 oz (67.2 kg), SpO2 100 %.  ECOG PERFORMANCE STATUS: 1  Physical Exam  Constitutional: Oriented to person, place, and time and well-developed, well-nourished, and in no distress.  HENT:  Head: Normocephalic and atraumatic.  Mouth/Throat: Oropharynx is clear and moist. No oropharyngeal exudate.  Eyes: Conjunctivae are normal. Right eye exhibits no discharge. Left eye exhibits no discharge. No scleral icterus.  Neck: Normal range of motion. Neck supple.  Cardiovascular: Normal rate, regular rhythm, normal heart sounds and intact distal pulses.   Pulmonary/Chest: Effort normal and breath sounds normal. No respiratory distress. No wheezes. No rales.  Abdominal: Soft. Bowel sounds are normal. Exhibits no distension and no mass. There is no tenderness.  Musculoskeletal: Normal range of motion. Exhibits no edema.  Lymphadenopathy:    No cervical adenopathy.  Neurological: Alert and oriented to person, place, and time. Exhibits normal muscle tone. Gait normal. Coordination normal.  Skin: Skin is warm and dry. No rash noted. Not diaphoretic. No erythema. No pallor.  Psychiatric: Mood, memory and judgment normal.  Vitals reviewed.  LABORATORY DATA: Lab Results  Component Value Date   WBC 13.9 (H) 07/27/2020   HGB 11.7 (L) 07/27/2020   HCT 35.3 (L) 07/27/2020   MCV 95.1  07/27/2020   PLT 285 07/27/2020      Chemistry      Component Value Date/Time   NA 136 07/27/2020 1427   K 4.5 07/27/2020 1427   CL 106 07/27/2020 1427   CO2 22 07/27/2020 1427   BUN 14 07/27/2020 1427   CREATININE 0.83 07/27/2020 1427      Component Value Date/Time   CALCIUM 8.9 07/27/2020 1427   ALKPHOS 95 07/27/2020 1427   AST 13 (L) 07/27/2020 1427   ALT 11 07/27/2020 1427   BILITOT 0.3 07/27/2020 1427       RADIOGRAPHIC STUDIES:  CT Chest W Contrast  Result Date: 07/05/2020 CLINICAL DATA:  Restaging metastatic non-small cell lung cancer. EXAM: CT CHEST, ABDOMEN, AND PELVIS WITH  CONTRAST TECHNIQUE: Multidetector CT imaging of the chest, abdomen and pelvis was performed following the standard protocol during bolus administration of intravenous contrast. CONTRAST:  185m OMNIPAQUE IOHEXOL 300 MG/ML  SOLN COMPARISON:  PET-CT 04/20/2020 FINDINGS: CT CHEST FINDINGS Cardiovascular: The heart is normal in size. No pericardial effusion. Stable tortuosity the calcification of the thoracic aorta but focal aneurysm or dissection. Stable coronary artery calcifications. Mediastinum/nodes: Stable appearing 7.5 mm right hilar lymph node which was hypermetabolic PET-CT. Stable 5 mm right paratracheal which was also hypermetabolic. No new or progressive findings. Lungs/Pleura: Bilateral upper lobe lung masses are stable in size when compared to the prior chest CT from 03/31/2020. The right upper lobe lung mass measures a maximum 4.6 x 3.0 cm and previously measured 4.4 x 3.2 cm. The left upper lobe lung lesion measures approximately 4.5 x 3.3 cm and previously measured 4.4 x 3.8 cm. Stable diffuse left-sided pleural thickening, pleural nodularity pleural effusion. No new pulmonary nodules Stable underlying emphysematous changes and areas of pulmonary scarring. Musculoskeletal: No obvious lytic or sclerotic lesions CT ABDOMEN PELVIS FINDINGS Hepatobiliary: No hepatic lesions or intrahepatic biliary  dilatation. The gallbladder is surgically absent. No common bile duct dilatation. Pancreas: No mass, inflammation or ductal dilatation. Spleen: Normal size.  No lesions. Adrenals/Urinary Tract: The adrenal glands and kidneys are unremarkable and stable. Stable scarring changes involving the lower pole region of the right kidney. The bladder is unremarkable. Stomach/Bowel: The stomach, duodenum, small bowel and colon are grossly normal. Stable advanced descending colon and sigmoid colon diverticulosis. Vascular/Lymphatic: Stable appearing aortoiliac stent graft and 5 cm aortic aneurysm. No mesenteric or retroperitoneal mass or adenopathy. Reproductive: The prostate gland seminal vesicles are unremarkable. Other: No pelvic mass or adenopathy. No free pelvic fluid collections. No inguinal mass or adenopathy. No abdominal wall hernia or subcutaneous lesions. Musculoskeletal: No obvious lytic or sclerotic bone lesions. Suspected bone metastasis seen on the PET scan without definite CT correlate. A hypermetabolic right rib lesion seen on the PET scan appears to be a remote healed rib fracture. IMPRESSION: 1. Stable bilateral upper lobe lung masses and mediastinal/hilar lymph nodes. No new or progressive findings. 2. Stable diffuse left-sided pleural thickening, pleural nodularity and effusion. 3. Stable emphysematous changes and pulmonary scarring. 4. No findings for abdominal/pelvic metastatic disease. 5. Stable aortoiliac stent graft and 5 cm aortic aneurysm. 6. No obvious lytic or sclerotic bone metastasis. Aortic Atherosclerosis (ICD10-I70.0) and Emphysema (ICD10-J43.9). Electronically Signed   By: PMarijo SanesM.D.   On: 07/05/2020 10:39   MR CERVICAL SPINE W WO CONTRAST  Result Date: 07/13/2020 CLINICAL DATA:  Neck pain. History of metastatic lung cancer. Possible C2 metastasis seen on PET-CT. EXAM: MRI CERVICAL SPINE WITHOUT AND WITH CONTRAST TECHNIQUE: Multiplanar and multiecho pulse sequences of the cervical  spine, to include the craniocervical junction and cervicothoracic junction, were obtained without and with intravenous contrast. CONTRAST:  739mGADAVIST GADOBUTROL 1 MMOL/ML IV SOLN COMPARISON:  PET-CT dated April 20, 2020. FINDINGS: Alignment: Trace anterolisthesis at C6-C7 and C7-T1. Vertebrae: There is some patchy, heterogeneous increased STIR marrow signal with corresponding enhancement involving all of the cervical vertebral bodies to varying degrees. No discrete lesion. Chronic degenerative endplate fatty marrow changes at C4-C5 and C6-C7. No fracture or evidence of discitis. Cord: Normal signal and morphology.  No intradural enhancement. Posterior Fossa, vertebral arteries, paraspinal tissues: Negative. Disc levels: C2-C3: Negative disc. Severe right facet arthropathy. No stenosis. C3-C4: Mild disc bulging. Severe bilateral facet arthropathy. Severe right and moderate left neuroforaminal stenosis. No  spinal canal stenosis. C4-C5: Small posterior disc osteophyte complex. Severe left and mild right facet arthropathy. No stenosis. C5-C6: Small left paracentral disc osteophyte complex. Mild bilateral facet uncovertebral hypertrophy. Mild left neuroforaminal stenosis. No spinal canal or right neuroforaminal stenosis. C6-C7: Small circumferential disc osteophyte complex. Mild-to-moderate bilateral facet uncovertebral hypertrophy. No stenosis. C7-T1: Negative disc. Moderate bilateral facet arthropathy. No stenosis. IMPRESSION: 1. Fairly diffuse nonfocal increased STIR marrow signal with corresponding enhancement involving all of the cervical vertebral bodies to varying degrees. This is favored to represent red marrow reconversion related to chemotherapy. No discrete metastatic lesion identified. 2. Multilevel degenerative changes of the cervical spine as described above with severe right and moderate left neuroforaminal stenosis at C3-C4. Electronically Signed   By: Titus Dubin M.D.   On: 07/13/2020 16:29   CT  Abdomen Pelvis W Contrast  Result Date: 07/05/2020 CLINICAL DATA:  Restaging metastatic non-small cell lung cancer. EXAM: CT CHEST, ABDOMEN, AND PELVIS WITH CONTRAST TECHNIQUE: Multidetector CT imaging of the chest, abdomen and pelvis was performed following the standard protocol during bolus administration of intravenous contrast. CONTRAST:  182m OMNIPAQUE IOHEXOL 300 MG/ML  SOLN COMPARISON:  PET-CT 04/20/2020 FINDINGS: CT CHEST FINDINGS Cardiovascular: The heart is normal in size. No pericardial effusion. Stable tortuosity the calcification of the thoracic aorta but focal aneurysm or dissection. Stable coronary artery calcifications. Mediastinum/nodes: Stable appearing 7.5 mm right hilar lymph node which was hypermetabolic PET-CT. Stable 5 mm right paratracheal which was also hypermetabolic. No new or progressive findings. Lungs/Pleura: Bilateral upper lobe lung masses are stable in size when compared to the prior chest CT from 03/31/2020. The right upper lobe lung mass measures a maximum 4.6 x 3.0 cm and previously measured 4.4 x 3.2 cm. The left upper lobe lung lesion measures approximately 4.5 x 3.3 cm and previously measured 4.4 x 3.8 cm. Stable diffuse left-sided pleural thickening, pleural nodularity pleural effusion. No new pulmonary nodules Stable underlying emphysematous changes and areas of pulmonary scarring. Musculoskeletal: No obvious lytic or sclerotic lesions CT ABDOMEN PELVIS FINDINGS Hepatobiliary: No hepatic lesions or intrahepatic biliary dilatation. The gallbladder is surgically absent. No common bile duct dilatation. Pancreas: No mass, inflammation or ductal dilatation. Spleen: Normal size.  No lesions. Adrenals/Urinary Tract: The adrenal glands and kidneys are unremarkable and stable. Stable scarring changes involving the lower pole region of the right kidney. The bladder is unremarkable. Stomach/Bowel: The stomach, duodenum, small bowel and colon are grossly normal. Stable advanced  descending colon and sigmoid colon diverticulosis. Vascular/Lymphatic: Stable appearing aortoiliac stent graft and 5 cm aortic aneurysm. No mesenteric or retroperitoneal mass or adenopathy. Reproductive: The prostate gland seminal vesicles are unremarkable. Other: No pelvic mass or adenopathy. No free pelvic fluid collections. No inguinal mass or adenopathy. No abdominal wall hernia or subcutaneous lesions. Musculoskeletal: No obvious lytic or sclerotic bone lesions. Suspected bone metastasis seen on the PET scan without definite CT correlate. A hypermetabolic right rib lesion seen on the PET scan appears to be a remote healed rib fracture. IMPRESSION: 1. Stable bilateral upper lobe lung masses and mediastinal/hilar lymph nodes. No new or progressive findings. 2. Stable diffuse left-sided pleural thickening, pleural nodularity and effusion. 3. Stable emphysematous changes and pulmonary scarring. 4. No findings for abdominal/pelvic metastatic disease. 5. Stable aortoiliac stent graft and 5 cm aortic aneurysm. 6. No obvious lytic or sclerotic bone metastasis. Aortic Atherosclerosis (ICD10-I70.0) and Emphysema (ICD10-J43.9). Electronically Signed   By: PMarijo SanesM.D.   On: 07/05/2020 10:39     ASSESSMENT/PLAN:  This  is a very pleasant 75 year old Caucasian male diagnosed with stage IV non-small cell lung cancer, squamous cell carcinoma.  The patient presented with a right upper lobe lung mass, left upper lobe lung mass, loculated left pleural effusion, mild hypermetabolism in the right hilar anterior mediastinal lymph nodes, and a few small metastatic bone lesions on C2, L5, right ribs, and right iliac crest.  He was diagnosed in March 2022.  His PD-L1 expression is 0%.    The patient is currently undergoing systemic chemotherapy with carboplatin for AUC of 5, paclitaxel 175 mg/M2 and Keytruda 200 mg IV every 3 weeks with Neulasta support status post 4 cycles of treatment. He tolerated the first week of the  treatment well except for the fatigue and aching pain from the Neulasta injection.  Starting from cycle #5, the patient will begin maintenance single agent immunotherapy with Keytruda.   He completed palliative radiotherapy for the hemoptysis under the care of Dr. Lisbeth Renshaw.  This was completed on 06/24/2020.  Labs are reviewed.  Recommend that he proceed with cycle #5 today scheduled.  We will see him back for follow-up visit in 3 weeks for evaluation before starting cycle #6.  The patient states his odynophagia/dysphagia improving. No significant thrush appreciated on exam today. Discussed with the patient we could have him see GI for further evaluation. He states he does not feel that is necessary as his symptoms are improving.   Reviewed constipation educations today. Also encouraged he use saline spray and mucinex as needed for nasal congestion.   The patient was advised to call immediately if he has any concerning symptoms in the interval. The patient voices understanding of current disease status and treatment options and is in agreement with the current care plan. All questions were answered. The patient knows to call the clinic with any problems, questions or concerns. We can certainly see the patient much sooner if necessary        No orders of the defined types were placed in this encounter.    The total time spent in the appointment was 20-29 minutes in this encounter.   Terreon Ekholm L Torrie Lafavor, PA-C 07/27/20

## 2020-07-26 ENCOUNTER — Other Ambulatory Visit: Payer: Self-pay | Admitting: Medical Oncology

## 2020-07-26 DIAGNOSIS — C3490 Malignant neoplasm of unspecified part of unspecified bronchus or lung: Secondary | ICD-10-CM

## 2020-07-27 ENCOUNTER — Inpatient Hospital Stay: Payer: Medicare Other

## 2020-07-27 ENCOUNTER — Other Ambulatory Visit: Payer: Self-pay

## 2020-07-27 ENCOUNTER — Inpatient Hospital Stay (HOSPITAL_BASED_OUTPATIENT_CLINIC_OR_DEPARTMENT_OTHER): Payer: Medicare Other | Admitting: Physician Assistant

## 2020-07-27 VITALS — BP 120/64 | HR 83 | Temp 98.2°F | Resp 18 | Ht 67.0 in | Wt 148.1 lb

## 2020-07-27 DIAGNOSIS — Z5112 Encounter for antineoplastic immunotherapy: Secondary | ICD-10-CM | POA: Diagnosis not present

## 2020-07-27 DIAGNOSIS — J90 Pleural effusion, not elsewhere classified: Secondary | ICD-10-CM | POA: Diagnosis not present

## 2020-07-27 DIAGNOSIS — Z95828 Presence of other vascular implants and grafts: Secondary | ICD-10-CM

## 2020-07-27 DIAGNOSIS — C3411 Malignant neoplasm of upper lobe, right bronchus or lung: Secondary | ICD-10-CM | POA: Diagnosis not present

## 2020-07-27 DIAGNOSIS — C7951 Secondary malignant neoplasm of bone: Secondary | ICD-10-CM | POA: Diagnosis not present

## 2020-07-27 DIAGNOSIS — Z5111 Encounter for antineoplastic chemotherapy: Secondary | ICD-10-CM | POA: Diagnosis not present

## 2020-07-27 DIAGNOSIS — Z452 Encounter for adjustment and management of vascular access device: Secondary | ICD-10-CM | POA: Diagnosis not present

## 2020-07-27 DIAGNOSIS — C3490 Malignant neoplasm of unspecified part of unspecified bronchus or lung: Secondary | ICD-10-CM

## 2020-07-27 DIAGNOSIS — C3491 Malignant neoplasm of unspecified part of right bronchus or lung: Secondary | ICD-10-CM | POA: Diagnosis not present

## 2020-07-27 LAB — CMP (CANCER CENTER ONLY)
ALT: 11 U/L (ref 0–44)
AST: 13 U/L — ABNORMAL LOW (ref 15–41)
Albumin: 3 g/dL — ABNORMAL LOW (ref 3.5–5.0)
Alkaline Phosphatase: 95 U/L (ref 38–126)
Anion gap: 8 (ref 5–15)
BUN: 14 mg/dL (ref 8–23)
CO2: 22 mmol/L (ref 22–32)
Calcium: 8.9 mg/dL (ref 8.9–10.3)
Chloride: 106 mmol/L (ref 98–111)
Creatinine: 0.83 mg/dL (ref 0.61–1.24)
GFR, Estimated: >60 mL/min (ref >60)
Glucose, Bld: 109 mg/dL — ABNORMAL HIGH (ref 70–99)
Potassium: 4.5 mmol/L (ref 3.5–5.1)
Sodium: 136 mmol/L (ref 135–145)
Total Bilirubin: 0.3 mg/dL (ref 0.3–1.2)
Total Protein: 6.8 g/dL (ref 6.5–8.1)

## 2020-07-27 LAB — CBC WITH DIFFERENTIAL (CANCER CENTER ONLY)
Abs Immature Granulocytes: 0.07 10*3/uL (ref 0.00–0.07)
Basophils Absolute: 0.1 10*3/uL (ref 0.0–0.1)
Basophils Relative: 1 %
Eosinophils Absolute: 0.1 10*3/uL (ref 0.0–0.5)
Eosinophils Relative: 0 %
HCT: 35.3 % — ABNORMAL LOW (ref 39.0–52.0)
Hemoglobin: 11.7 g/dL — ABNORMAL LOW (ref 13.0–17.0)
Immature Granulocytes: 1 %
Lymphocytes Relative: 3 %
Lymphs Abs: 0.5 10*3/uL — ABNORMAL LOW (ref 0.7–4.0)
MCH: 31.5 pg (ref 26.0–34.0)
MCHC: 33.1 g/dL (ref 30.0–36.0)
MCV: 95.1 fL (ref 80.0–100.0)
Monocytes Absolute: 0.9 10*3/uL (ref 0.1–1.0)
Monocytes Relative: 7 %
Neutro Abs: 12.3 10*3/uL — ABNORMAL HIGH (ref 1.7–7.7)
Neutrophils Relative %: 88 %
Platelet Count: 285 10*3/uL (ref 150–400)
RBC: 3.71 MIL/uL — ABNORMAL LOW (ref 4.22–5.81)
RDW: 17 % — ABNORMAL HIGH (ref 11.5–15.5)
WBC Count: 13.9 10*3/uL — ABNORMAL HIGH (ref 4.0–10.5)
nRBC: 0 % (ref 0.0–0.2)

## 2020-07-27 MED ORDER — SODIUM CHLORIDE 0.9% FLUSH
10.0000 mL | INTRAVENOUS | Status: DC | PRN
  Administered 2020-07-27: 10 mL
  Filled 2020-07-27: qty 10

## 2020-07-27 MED ORDER — SODIUM CHLORIDE 0.9 % IV SOLN
Freq: Once | INTRAVENOUS | Status: AC
  Filled 2020-07-27: qty 250

## 2020-07-27 MED ORDER — HEPARIN SOD (PORK) LOCK FLUSH 100 UNIT/ML IV SOLN
500.0000 [IU] | Freq: Once | INTRAVENOUS | Status: AC | PRN
Start: 2020-07-27 — End: 2020-07-27
  Administered 2020-07-27: 500 [IU]
  Filled 2020-07-27: qty 5

## 2020-07-27 MED ORDER — SODIUM CHLORIDE 0.9% FLUSH
10.0000 mL | Freq: Once | INTRAVENOUS | Status: AC
  Administered 2020-07-27: 10 mL
  Filled 2020-07-27: qty 10

## 2020-07-27 MED ORDER — SODIUM CHLORIDE 0.9 % IV SOLN
200.0000 mg | Freq: Once | INTRAVENOUS | Status: AC
  Administered 2020-07-27: 200 mg via INTRAVENOUS
  Filled 2020-07-27: qty 8

## 2020-07-27 NOTE — Patient Instructions (Signed)
[facility]  Discharge Instructions: ?Thank you for choosing [facility] to provide your oncology and hematology care.  ? ?If you have a lab appointment with the Cancer Center, please go directly to the Cancer Center and check in at the registration area. ?  ?Wear comfortable clothing and clothing appropriate for easy access to any Portacath or PICC line.  ? ?We strive to give you quality time with your provider. You may need to reschedule your appointment if you arrive late (15 or more minutes).  Arriving late affects you and other patients whose appointments are after yours.  Also, if you miss three or more appointments without notifying the office, you may be dismissed from the clinic at the provider?s discretion.    ?  ?For prescription refill requests, have your pharmacy contact our office and allow 72 hours for refills to be completed.   ? ?Today you received the following chemotherapy and/or immunotherapy agents: Keytruda ?  ?To help prevent nausea and vomiting after your treatment, we encourage you to take your nausea medication as directed. ? ?BELOW ARE SYMPTOMS THAT SHOULD BE REPORTED IMMEDIATELY: ?*FEVER GREATER THAN 100.4 F (38 ?C) OR HIGHER ?*CHILLS OR SWEATING ?*NAUSEA AND VOMITING THAT IS NOT CONTROLLED WITH YOUR NAUSEA MEDICATION ?*UNUSUAL SHORTNESS OF BREATH ?*UNUSUAL BRUISING OR BLEEDING ?*URINARY PROBLEMS (pain or burning when urinating, or frequent urination) ?*BOWEL PROBLEMS (unusual diarrhea, constipation, pain near the anus) ?TENDERNESS IN MOUTH AND THROAT WITH OR WITHOUT PRESENCE OF ULCERS (sore throat, sores in mouth, or a toothache) ?UNUSUAL RASH, SWELLING OR PAIN  ?UNUSUAL VAGINAL DISCHARGE OR ITCHING  ? ?Items with * indicate a potential emergency and should be followed up as soon as possible or go to the Emergency Department if any problems should occur. ? ?Please show the CHEMOTHERAPY ALERT CARD or IMMUNOTHERAPY ALERT CARD at check-in to the  Emergency Department and triage nurse. ? ?Should you have questions after your visit or need to cancel or reschedule your appointment, please contact [facility]  Dept: [phone]  and follow the prompts.  Office hours are 8:00 a.m. to 4:30 p.m. Monday - Friday. Please note that voicemails left after 4:00 p.m. may not be returned until the following business day.  We are closed weekends and major holidays. You have access to a nurse at all times for urgent questions. Please call the main number to the clinic Dept: [phone] and follow the prompts. ? ? ?For any non-urgent questions, you may also contact your provider using MyChart. We now offer e-Visits for anyone 18 and older to request care online for non-urgent symptoms. For details visit [email or web address]. ?  ?Also download the MyChart app! Go to the app store, search "MyChart", open the app, select [facility], and log in with your MyChart username and password. ? ?Due to Covid, a mask is required upon entering the hospital/clinic. If you do not have a mask, one will be given to you upon arrival. For doctor visits, patients may have 1 support person aged 18 or older with them. For treatment visits, patients cannot have anyone with them due to current Covid guidelines and our immunocompromised population.  ? ?

## 2020-07-29 ENCOUNTER — Telehealth: Payer: Self-pay | Admitting: Physician Assistant

## 2020-07-29 NOTE — Telephone Encounter (Signed)
Scheduled per los. Called, not able to leave msg. Mailed printout  

## 2020-08-03 ENCOUNTER — Other Ambulatory Visit: Payer: Medicare Other

## 2020-08-10 ENCOUNTER — Other Ambulatory Visit: Payer: Medicare Other

## 2020-08-12 NOTE — Progress Notes (Signed)
[facility] OFFICE PROGRESS NOTE  Riley Small, MD [address] [place] [id number]  DIAGNOSIS:  Stage IV non-Sharp cell lung cancer, squamous cell carcinoma.  He presented with a right upper lobe lung mass, left upper lobe lung mass, and a loculated left pleural effusion.  He had mild hypermetabolism in the right hilar and paramediastinal lymphadenopathy.  He had a few Sharp bone lesions on C2, L5, right ribs, and right iliac crest.  He was diagnosed in March 2022   PD-L1: 0%  PRIOR THERAPY: Palliative radiotherapy to the lung for the hemoptysis under the care of Dr. Lisbeth Renshaw.  Last treatment on 06/24/2020.  CURRENT THERAPY: Systemic chemotherapy with carboplatin for an AUC of 5, paclitaxel 175 mg/m2, Keytruda 200 mg IV 3 weeks with Neulasta support.  First dose expected on 05/02/2020. Status post 5 cycles. Starting from cycle #5 he will be on single agent maintenance Keytruda.  INTERVAL HISTORY: Riley Sharp 75 y.o. male returns to the clinic today for a follow-up visit.  The patient is feeling fine today without any concerning complaints. He had a few episodes where he felt lightheaded upon standing which does not occur very often. He notes he has had a few episodes of hypotension at home but other times it is normal. He is on metoprolol.  The patient had previously been endorsing the beginning of peripheral neuropathy, fatigue, decreased appetite.  At the patient's last appointment, he started maintenance single agent immunotherapy with [facility] and he tolerated this cycle well. His symptoms have been easier since starting maintenance treatment. His fatigue is stable. No new neuropathy. His appetite is stable. His weight is stable. He denies any fever, chills, or night sweats.  His swallowing concerns from radiation have improved.  He denies any chest pain or hemoptysis.  He reports his baseline dyspnea on exertion and cough which is associated with trying to clear is  throat for post nasal drainage. He tried using mucinex and Claritin without any significant improvement in his post nasal drainage and clearing his throat.  He states he sometimes gags on mucus. He denies facial pressure or pain. He denies any recent nausea or vomiting, constipation, or diarrhea. He denies any headache or visual changes.  He denies any rashes or skin changes.  He is here today for evaluation before starting cycle #6.  MEDICAL HISTORY: Past Medical History:  Diagnosis Date   AAA (abdominal aortic aneurysm) (HCC)    CAD (coronary artery disease)    Cancer ([facility])    basal cell carcimona right ear and upper left at shoulder   Glaucoma    Hyperlipidemia    Hypertension    Myocardial infarction ([facility]) 03/2003   Wears glasses     ALLERGIES:  has No Known Allergies.  MEDICATIONS:  Current Outpatient Medications  Medication Sig Dispense Refill   albuterol (PROVENTIL) (2.5 MG/3ML) 0.083% nebulizer solution Take 3 mLs (2.5 mg total) by nebulization every 6 (six) hours as needed for wheezing or shortness of breath. 75 mL 12   albuterol (VENTOLIN HFA) 108 (90 Base) MCG/ACT inhaler Inhale 2 puffs into the lungs every 6 (six) hours as needed for wheezing or shortness of breath. 8 g 6   aspirin EC 81 MG tablet Take 81 mg by mouth daily.     brimonidine (ALPHAGAN) 0.2 % ophthalmic solution Place 1 drop into both eyes 3 (three) times daily.     dorzolamide (TRUSOPT) 2 % ophthalmic solution Place 1 drop into both  eyes 3 (three) times daily.     latanoprost (XALATAN) 0.005 % ophthalmic solution Place 1 drop into both eyes at bedtime.     lidocaine-prilocaine (EMLA) cream Apply 1 application topically as needed. 30 g 2   loratadine (CLARITIN) 10 MG tablet Take 10 mg by mouth daily.     metoprolol succinate (TOPROL-XL) 25 MG 24 hr tablet Take 25 mg by mouth daily.     ondansetron (ZOFRAN) 8 MG tablet Take 1 tablet by mouth every 8 (eight) hours as needed.     prochlorperazine (COMPAZINE) 10 MG  tablet Take 1 tablet (10 mg total) by mouth every 6 (six) hours as needed. 30 tablet 2   ramipril (ALTACE) 5 MG tablet Take 5 mg by mouth daily.     rosuvastatin (CRESTOR) 10 MG tablet Take 10 mg by mouth daily.     timolol (TIMOPTIC) 0.5 % ophthalmic solution Place 1 drop into both eyes daily.     Tiotropium Bromide-Olodaterol 2.5-2.5 MCG/ACT AERS Inhale 2 puffs into the lungs daily. 2 each 0   No current facility-administered medications for this visit.    SURGICAL HISTORY:  Past Surgical History:  Procedure Laterality Date   ABDOMINAL AORTIC ENDOVASCULAR STENT GRAFT N/A 11/14/2015   Procedure: ABDOMINAL AORTIC ENDOVASCULAR STENT GRAFT;  Surgeon: Elam Dutch, MD;  Location: [facility];  Service: Vascular;  Laterality: N/A;   APPENDECTOMY     BRONCHIAL BIOPSY  04/18/2020   Procedure: BRONCHIAL BIOPSIES;  Surgeon: [name], DO;  Location: [facility];  Service: Pulmonary;;   BRONCHIAL BRUSHINGS  04/18/2020   Procedure: BRONCHIAL BRUSHINGS;  Surgeon: [name], DO;  Location: [facility];  Service: Pulmonary;;   BRONCHIAL NEEDLE ASPIRATION BIOPSY  04/18/2020   Procedure: BRONCHIAL NEEDLE ASPIRATION BIOPSIES;  Surgeon: [name], DO;  Location: [facility];  Service: Pulmonary;;   BRONCHIAL WASHINGS  04/18/2020   Procedure: BRONCHIAL WASHINGS;  Surgeon: [name], DO;  Location: [facility];  Service: Pulmonary;;   CHOLECYSTECTOMY  2007   Gall Bladder   COLONOSCOPY W/ BIOPSIES AND POLYPECTOMY     EYE SURGERY Bilateral    laser for glaucoma both eyesn x 2   heart stent  Feb. 14, 2005   IR IMAGING GUIDED PORT INSERTION  05/03/2020   THORACENTESIS Left 04/18/2020   Procedure: THORACENTESIS;  Surgeon: [name], DO;  Location: [facility];  Service: Pulmonary;  Laterality: Left;   VIDEO BRONCHOSCOPY WITH ENDOBRONCHIAL NAVIGATION Bilateral 04/18/2020   Procedure: VIDEO BRONCHOSCOPY WITH ENDOBRONCHIAL NAVIGATION;  Surgeon: [name], DO;  Location: [facility];  Service: Pulmonary;  Laterality: Bilateral;    REVIEW OF SYSTEMS:   Constitutional: Negative for appetite change, chills, fatigue, fever and unexpected weight change. HENT: Positive for post nasal drainage. Negative for mouth sores, nosebleeds, sore throat Eyes: Negative for eye problems and icterus. Respiratory: Positive for baseline dyspnea on exertion and mild cough. Negative for hemoptysis and wheezing.   Cardiovascular: Negative for chest pain and leg swelling. Gastrointestinal:  Negative for abdominal pain, constipation, nausea/vomiting and diarrhea. Genitourinary: Negative for bladder incontinence, difficulty urinating, dysuria, frequency and hematuria.   Musculoskeletal: Negative for back pain, gait problem, neck pain and neck stiffness. Skin: Negative for itching and rash. Neurological: Positive for intermittent lightheadedness. Negative for dizziness, extremity weakness, gait problem, headaches,  and seizures. Hematological: Negative for adenopathy. Does not bruise/bleed easily. Psychiatric/Behavioral: Negative for confusion, depression and sleep disturbance. The patient is not nervous/anxious.     PHYSICAL EXAMINATION:  Blood pressure 116/65, pulse 68, resp. rate 18, weight 148 lb (67.1 kg), SpO2 100 %.  ECOG PERFORMANCE STATUS: 1  Physical Exam  Constitutional: Oriented to person, place, and time and well-developed, well-nourished, and in no distress. HENT: Head: Normocephalic and atraumatic. Mouth/Throat: Oropharynx is clear and moist. No oropharyngeal exudate. Eyes: Conjunctivae are normal. Right eye exhibits no discharge. Left eye exhibits no discharge. No scleral icterus. Neck: Normal range of motion. Neck supple. Cardiovascular: Normal rate, regular rhythm, normal heart sounds and intact distal pulses.   Pulmonary/Chest: Effort normal and breath sounds normal. No respiratory distress. No wheezes. No rales. Abdominal: Soft. Bowel sounds are normal. Exhibits  no distension and no mass. There is no tenderness.  Musculoskeletal: Normal range of motion. Exhibits no edema.  Lymphadenopathy:    No cervical adenopathy.  Neurological: Alert and oriented to person, place, and time. Exhibits normal muscle tone. Gait normal. Coordination normal. Skin: Skin is warm and dry. No rash noted. Not diaphoretic. No erythema. No pallor.  Psychiatric: Mood, memory and judgment normal. Vitals reviewed.  LABORATORY DATA: Lab Results  Component Value Date   WBC 9.9 08/17/2020   HGB 12.0 (L) 08/17/2020   HCT 36.0 (L) 08/17/2020   MCV 95.2 08/17/2020   PLT 211 08/17/2020      Chemistry      Component Value Date/Time   NA 136 08/17/2020 1039   K 4.1 08/17/2020 1039   CL 103 08/17/2020 1039   CO2 24 08/17/2020 1039   BUN 13 08/17/2020 1039   CREATININE 0.84 08/17/2020 1039      Component Value Date/Time   CALCIUM 9.1 08/17/2020 1039   ALKPHOS 76 08/17/2020 1039   AST 14 (L) 08/17/2020 1039   ALT 12 08/17/2020 1039   BILITOT 0.3 08/17/2020 1039       RADIOGRAPHIC STUDIES:  No results found.   ASSESSMENT/PLAN:  This is a very pleasant 75 year old Caucasian male diagnosed with stage IV non-Sharp cell lung cancer, squamous cell carcinoma.  The patient presented with a right upper lobe lung mass, left upper lobe lung mass, loculated left pleural effusion, mild hypermetabolism in the right hilar anterior mediastinal lymph nodes, and a few Sharp metastatic bone lesions on C2, L5, right ribs, and right iliac crest.  He was diagnosed in March 2022.  His PD-L1 expression is 0%.    The patient is currently undergoing systemic chemotherapy with carboplatin for AUC of 5, paclitaxel 175 mg/M2 and Keytruda 200 mg IV every 3 weeks with Neulasta support status post 5 cycles of treatment.  Starting from cycle #5, the patient has been on maintenance single agent immunotherapy with Keytruda.   He completed palliative radiotherapy for the hemoptysis under the care of  Dr. Lisbeth Renshaw.  This was completed on 06/24/2020.   Labs are reviewed.  Recommend that he proceed with cycle #6 today scheduled.  We will see him back for follow-up visit in 3 weeks for evaluation and to review his scan results before starting cycle #7.  I will arrange for restaging CT scan the chest, abdomen, and pelvis prior to starting his next cycle of treatment.  Regarding his occasional lightheadedness, sounds consistent with orthostatic hypotension. I advised him to change positions slower and to stay hydrated. I also recommend that he check his pulse and BP during this episodes and to keep a log of his readings. If he has frequent bradycardia or hypotension, then I recommend he reach out to his prescribing provider for his metoprolol to see if he  needs dose adjustment. Today, his BP and pulse are WNL.   For his nasal congestion, he denies sinus pain or pressure. Denies fevers, chills, or sore throat. He has a history of allergic rhinitis. Advised he can continue with an anti-histamine and mucinex. I also recommended he try to use saline spray.   The patient was advised to call immediately if he has any concerning symptoms in the interval. The patient voices understanding of current disease status and treatment options and is in agreement with the current care plan. All questions were answered. The patient knows to call the clinic with any problems, questions or concerns. We can certainly see the patient much sooner if necessary          Orders Placed This Encounter  Procedures   CT Chest W Contrast    Standing Status:   Future    Standing Expiration Date:   08/17/2021    Order Specific Question:   If indicated for the ordered procedure, I authorize the administration of contrast media per Radiology protocol    Answer:   Yes    Order Specific Question:   Preferred imaging location?    Answer:   [facility] ([place]   CT Abdomen Pelvis W Contrast    Standing Status:   Future     Standing Expiration Date:   08/17/2021    Order Specific Question:   If indicated for the ordered procedure, I authorize the administration of contrast media per Radiology protocol    Answer:   Yes    Order Specific Question:   Preferred imaging location?    Answer:   [facility]    Order Specific Question:   Is Oral Contrast requested for this exam?    Answer:   Yes, Per Radiology protocol     The total time spent in the appointment was 20-29 minutes.  Tayvien Kane L Merick Kelleher, PA-C 08/17/20

## 2020-08-17 ENCOUNTER — Other Ambulatory Visit: Payer: Self-pay

## 2020-08-17 ENCOUNTER — Encounter: Payer: Self-pay | Admitting: Physician Assistant

## 2020-08-17 ENCOUNTER — Inpatient Hospital Stay: Payer: Medicare Other | Attending: Physician Assistant | Admitting: Physician Assistant

## 2020-08-17 ENCOUNTER — Inpatient Hospital Stay: Payer: Medicare Other

## 2020-08-17 VITALS — BP 116/65 | HR 68 | Resp 18 | Wt 148.0 lb

## 2020-08-17 DIAGNOSIS — C3491 Malignant neoplasm of unspecified part of right bronchus or lung: Secondary | ICD-10-CM

## 2020-08-17 DIAGNOSIS — Z95828 Presence of other vascular implants and grafts: Secondary | ICD-10-CM

## 2020-08-17 DIAGNOSIS — C7951 Secondary malignant neoplasm of bone: Secondary | ICD-10-CM | POA: Diagnosis not present

## 2020-08-17 DIAGNOSIS — Z5112 Encounter for antineoplastic immunotherapy: Secondary | ICD-10-CM | POA: Diagnosis not present

## 2020-08-17 DIAGNOSIS — C3411 Malignant neoplasm of upper lobe, right bronchus or lung: Secondary | ICD-10-CM | POA: Insufficient documentation

## 2020-08-17 DIAGNOSIS — Z79899 Other long term (current) drug therapy: Secondary | ICD-10-CM | POA: Diagnosis not present

## 2020-08-17 DIAGNOSIS — J90 Pleural effusion, not elsewhere classified: Secondary | ICD-10-CM | POA: Insufficient documentation

## 2020-08-17 DIAGNOSIS — R5383 Other fatigue: Secondary | ICD-10-CM

## 2020-08-17 DIAGNOSIS — C3490 Malignant neoplasm of unspecified part of unspecified bronchus or lung: Secondary | ICD-10-CM

## 2020-08-17 LAB — CMP (CANCER CENTER ONLY)
ALT: 12 U/L (ref 0–44)
AST: 14 U/L — ABNORMAL LOW (ref 15–41)
Albumin: 3.5 g/dL (ref 3.5–5.0)
Alkaline Phosphatase: 76 U/L (ref 38–126)
Anion gap: 9 (ref 5–15)
BUN: 13 mg/dL (ref 8–23)
CO2: 24 mmol/L (ref 22–32)
Calcium: 9.1 mg/dL (ref 8.9–10.3)
Chloride: 103 mmol/L (ref 98–111)
Creatinine: 0.84 mg/dL (ref 0.61–1.24)
GFR, Estimated: >60 mL/min (ref >60)
Glucose, Bld: 116 mg/dL — ABNORMAL HIGH (ref 70–99)
Potassium: 4.1 mmol/L (ref 3.5–5.1)
Sodium: 136 mmol/L (ref 135–145)
Total Bilirubin: 0.3 mg/dL (ref 0.3–1.2)
Total Protein: 7.1 g/dL (ref 6.5–8.1)

## 2020-08-17 LAB — CBC WITH DIFFERENTIAL (CANCER CENTER ONLY)
Abs Immature Granulocytes: 0.03 10*3/uL (ref 0.00–0.07)
Basophils Absolute: 0.1 10*3/uL (ref 0.0–0.1)
Basophils Relative: 1 %
Eosinophils Absolute: 0.3 10*3/uL (ref 0.0–0.5)
Eosinophils Relative: 3 %
HCT: 36 % — ABNORMAL LOW (ref 39.0–52.0)
Hemoglobin: 12 g/dL — ABNORMAL LOW (ref 13.0–17.0)
Immature Granulocytes: 0 %
Lymphocytes Relative: 5 %
Lymphs Abs: 0.5 10*3/uL — ABNORMAL LOW (ref 0.7–4.0)
MCH: 31.7 pg (ref 26.0–34.0)
MCHC: 33.3 g/dL (ref 30.0–36.0)
MCV: 95.2 fL (ref 80.0–100.0)
Monocytes Absolute: 0.6 10*3/uL (ref 0.1–1.0)
Monocytes Relative: 6 %
Neutro Abs: 8.4 10*3/uL — ABNORMAL HIGH (ref 1.7–7.7)
Neutrophils Relative %: 85 %
Platelet Count: 211 10*3/uL (ref 150–400)
RBC: 3.78 MIL/uL — ABNORMAL LOW (ref 4.22–5.81)
RDW: 14.3 % (ref 11.5–15.5)
WBC Count: 9.9 10*3/uL (ref 4.0–10.5)
nRBC: 0 % (ref 0.0–0.2)

## 2020-08-17 LAB — TSH: TSH: 2.516 u[IU]/mL (ref 0.350–4.500)

## 2020-08-17 MED ORDER — SODIUM CHLORIDE 0.9 % IV SOLN
Freq: Once | INTRAVENOUS | Status: AC
  Filled 2020-08-17: qty 250

## 2020-08-17 MED ORDER — SODIUM CHLORIDE 0.9% FLUSH
10.0000 mL | INTRAVENOUS | Status: DC | PRN
Start: 2020-08-17 — End: 2020-08-17
  Administered 2020-08-17: 10 mL
  Filled 2020-08-17: qty 10

## 2020-08-17 MED ORDER — HEPARIN SOD (PORK) LOCK FLUSH 100 UNIT/ML IV SOLN
500.0000 [IU] | Freq: Once | INTRAVENOUS | Status: AC | PRN
  Administered 2020-08-17: 500 [IU]
  Filled 2020-08-17: qty 5

## 2020-08-17 MED ORDER — SODIUM CHLORIDE 0.9 % IV SOLN
200.0000 mg | Freq: Once | INTRAVENOUS | Status: AC
  Administered 2020-08-17: 200 mg via INTRAVENOUS
  Filled 2020-08-17: qty 8

## 2020-08-17 MED ORDER — SODIUM CHLORIDE 0.9% FLUSH
10.0000 mL | Freq: Once | INTRAVENOUS | Status: AC
  Administered 2020-08-17: 10 mL
  Filled 2020-08-17: qty 10

## 2020-08-17 NOTE — Patient Instructions (Signed)
[facility]  Discharge Instructions: ?Thank you for choosing [facility] to provide your oncology and hematology care.  ? ?If you have a lab appointment with the Cancer Center, please go directly to the Cancer Center and check in at the registration area. ?  ?Wear comfortable clothing and clothing appropriate for easy access to any Portacath or PICC line.  ? ?We strive to give you quality time with your provider. You may need to reschedule your appointment if you arrive late (15 or more minutes).  Arriving late affects you and other patients whose appointments are after yours.  Also, if you miss three or more appointments without notifying the office, you may be dismissed from the clinic at the provider?s discretion.    ?  ?For prescription refill requests, have your pharmacy contact our office and allow 72 hours for refills to be completed.   ? ?Today you received the following chemotherapy and/or immunotherapy agents: Keytruda ?  ?To help prevent nausea and vomiting after your treatment, we encourage you to take your nausea medication as directed. ? ?BELOW ARE SYMPTOMS THAT SHOULD BE REPORTED IMMEDIATELY: ?*FEVER GREATER THAN 100.4 F (38 ?C) OR HIGHER ?*CHILLS OR SWEATING ?*NAUSEA AND VOMITING THAT IS NOT CONTROLLED WITH YOUR NAUSEA MEDICATION ?*UNUSUAL SHORTNESS OF BREATH ?*UNUSUAL BRUISING OR BLEEDING ?*URINARY PROBLEMS (pain or burning when urinating, or frequent urination) ?*BOWEL PROBLEMS (unusual diarrhea, constipation, pain near the anus) ?TENDERNESS IN MOUTH AND THROAT WITH OR WITHOUT PRESENCE OF ULCERS (sore throat, sores in mouth, or a toothache) ?UNUSUAL RASH, SWELLING OR PAIN  ?UNUSUAL VAGINAL DISCHARGE OR ITCHING  ? ?Items with * indicate a potential emergency and should be followed up as soon as possible or go to the Emergency Department if any problems should occur. ? ?Please show the CHEMOTHERAPY ALERT CARD or IMMUNOTHERAPY ALERT CARD at check-in to the  Emergency Department and triage nurse. ? ?Should you have questions after your visit or need to cancel or reschedule your appointment, please contact [facility]  Dept: [phone]  and follow the prompts.  Office hours are 8:00 a.m. to 4:30 p.m. Monday - Friday. Please note that voicemails left after 4:00 p.m. may not be returned until the following business day.  We are closed weekends and major holidays. You have access to a nurse at all times for urgent questions. Please call the main number to the clinic Dept: [phone] and follow the prompts. ? ? ?For any non-urgent questions, you may also contact your provider using MyChart. We now offer e-Visits for anyone 18 and older to request care online for non-urgent symptoms. For details visit [email or web address]. ?  ?Also download the MyChart app! Go to the app store, search "MyChart", open the app, select [facility], and log in with your MyChart username and password. ? ?Due to Covid, a mask is required upon entering the hospital/clinic. If you do not have a mask, one will be given to you upon arrival. For doctor visits, patients may have 1 support person aged 18 or older with them. For treatment visits, patients cannot have anyone with them due to current Covid guidelines and our immunocompromised population.  ? ?

## 2020-08-22 ENCOUNTER — Other Ambulatory Visit: Payer: Self-pay

## 2020-08-22 ENCOUNTER — Ambulatory Visit
Admission: RE | Admit: 2020-08-22 | Discharge: 2020-08-22 | Disposition: A | Discharge status: Home / Self Care | Payer: Medicare Other | Source: Ambulatory Visit | Attending: Radiation Oncology | Admitting: Radiation Oncology

## 2020-08-22 DIAGNOSIS — C3491 Malignant neoplasm of unspecified part of right bronchus or lung: Secondary | ICD-10-CM

## 2020-08-22 NOTE — Progress Notes (Signed)
  Radiation Oncology         [phone] ________________________________  Name: Riley Sharp MRN: [id number]  Date of Service: 08/22/2020  DOB: 06-Jun-1945  Post Treatment Telephone Note  Diagnosis:   Stage IV non-small cell lung cancer with bulky disease in the right upper lobe and left upper lobe.  Interval Since Last Radiation:  9 weeks   06/06/2020 through 06/24/2020 Site Technique Total Dose (Gy) Dose per Fx (Gy) Completed Fx Beam Energies  Chest: Chest_Bilat 3D 37.5/37.5 2.5 15/15 6X   Narrative:  The patient was contacted today for routine follow-up. During treatment he did very well with radiotherapy and did not have significant desquamation. He reports he did have esophagitis and used carafate which has resolved. He's had sinus congestion and coughs up some clear to thick drainage. He's using OTC medications but denies fevers or chills.   Impression/Plan: 1. Stage IV non-small cell lung cancer with bulky disease in the right upper lobe and left upper lobe. The patient has been doing well since completion of radiotherapy. We discussed that we would be happy to continue to follow him as needed, but he will also continue to follow up with Dr. Julien Nordmann in medical oncology. I suggested he check his nasal spray to see if it is the generic for flonase which may also help.     Carola Rhine, PAC

## 2020-09-04 NOTE — Progress Notes (Signed)
[facility] OFFICE PROGRESS NOTE  Riley Small, MD [address] [place] [address]  DIAGNOSIS: Stage IV non-Sharp cell lung cancer, squamous cell carcinoma.  He presented with a right upper lobe lung mass, left upper lobe lung mass, and a loculated left pleural effusion.  He had mild hypermetabolism in the right hilar and paramediastinal lymphadenopathy.  He had a few Sharp bone lesions on C2, L5, right ribs, and right iliac crest.  He was diagnosed in March 2022   PD-L1: 0%  PRIOR THERAPY:  Palliative radiotherapy to the lung for the hemoptysis under the care of Dr. Lisbeth Sharp.  Last treatment on 06/24/2020  CURRENT THERAPY:  Systemic chemotherapy with carboplatin for an AUC of 5, paclitaxel 175 mg/m2, Keytruda 200 mg IV 3 weeks with Neulasta support.  First dose expected on 05/02/2020. Status post 6 cycles. Starting from cycle #5 he will be on single agent maintenance Keytruda.  INTERVAL HISTORY: Riley Sharp 75 y.o. male returns to the clinic today for a follow-up visit. The patient is feeling fine today without any concerning complaints. The patient had previously been endorsing the beginning of peripheral neuropathy, fatigue, decreased appetite while on chemotherapy. He continues to have intermittent peripheral neuropathy in his right hand, left foot, and right foot. It does not interfere with his daily activities and is not constant. He is not interested in gabapentin at this time but will think about it. For his last two cycles of treatment, he started maintenance single agent immunotherapy with Keytruda and he tolerated this well. He continues to have fatigue if he undergoes strenuous activity. His appetite is stable to slightly improved. He denies any fever, chills, or night sweats.  His swallowing concerns from radiation have improved but he feels like he needs to clear his throat frequently which is troubling to him.  He denies any chest pain or hemoptysis.  He  reports his baseline dyspnea on exertion and cough which is associated with trying to clear is throat. His cough may be a little worse. Sometimes he is unable to get anything up. He tried using mucinex and Claritin without any significant improvement.  He states he sometimes gags on mucus and might have nausea/emesis. He denies any diarrhea. He sometimes has mild constipation.Marland Kitchen He denies any headache or visual changes.  He denies any rashes or skin changes. The patient recently had a restaging CT scan. He is here today for evaluation and to review his scan before starting cycle #7.   MEDICAL HISTORY: Past Medical History:  Diagnosis Date   AAA (abdominal aortic aneurysm) (HCC)    CAD (coronary artery disease)    Cancer ([facility])    basal cell carcimona right ear and upper left at shoulder   Glaucoma    Hyperlipidemia    Hypertension    Myocardial infarction ([facility]) 03/2003   Wears glasses     ALLERGIES:  has No Known Allergies.  MEDICATIONS:  Current Outpatient Medications  Medication Sig Dispense Refill   albuterol (PROVENTIL) (2.5 MG/3ML) 0.083% nebulizer solution Take 3 mLs (2.5 mg total) by nebulization every 6 (six) hours as needed for wheezing or shortness of breath. 75 mL 12   albuterol (VENTOLIN HFA) 108 (90 Base) MCG/ACT inhaler Inhale 2 puffs into the lungs every 6 (six) hours as needed for wheezing or shortness of breath. 8 g 6   aspirin EC 81 MG tablet Take 81 mg by mouth daily.     brimonidine (ALPHAGAN) 0.2 % ophthalmic  solution Place 1 drop into both eyes 3 (three) times daily.     dorzolamide (TRUSOPT) 2 % ophthalmic solution Place 1 drop into both eyes 3 (three) times daily.     latanoprost (XALATAN) 0.005 % ophthalmic solution Place 1 drop into both eyes at bedtime.     lidocaine-prilocaine (EMLA) cream Apply 1 application topically as needed. 30 g 2   loratadine (CLARITIN) 10 MG tablet Take 10 mg by mouth daily.     metoprolol succinate (TOPROL-XL) 25 MG 24 hr tablet Take 25  mg by mouth daily.     ondansetron (ZOFRAN) 8 MG tablet Take 1 tablet by mouth every 8 (eight) hours as needed.     prochlorperazine (COMPAZINE) 10 MG tablet Take 1 tablet (10 mg total) by mouth every 6 (six) hours as needed. 30 tablet 2   ramipril (ALTACE) 5 MG tablet Take 5 mg by mouth daily.     rosuvastatin (CRESTOR) 10 MG tablet Take 10 mg by mouth daily.     timolol (TIMOPTIC) 0.5 % ophthalmic solution Place 1 drop into both eyes daily.     Tiotropium Bromide-Olodaterol 2.5-2.5 MCG/ACT AERS Inhale 2 puffs into the lungs daily. 2 each 0   No current facility-administered medications for this visit.    SURGICAL HISTORY:  Past Surgical History:  Procedure Laterality Date   ABDOMINAL AORTIC ENDOVASCULAR STENT GRAFT N/A 11/14/2015   Procedure: ABDOMINAL AORTIC ENDOVASCULAR STENT GRAFT;  Surgeon: Elam Dutch, MD;  Location: [facility];  Service: Vascular;  Laterality: N/A;   APPENDECTOMY     BRONCHIAL BIOPSY  04/18/2020   Procedure: BRONCHIAL BIOPSIES;  Surgeon: [name], DO;  Location: [facility];  Service: Pulmonary;;   BRONCHIAL BRUSHINGS  04/18/2020   Procedure: BRONCHIAL BRUSHINGS;  Surgeon: [name], DO;  Location: [facility];  Service: Pulmonary;;   BRONCHIAL NEEDLE ASPIRATION BIOPSY  04/18/2020   Procedure: BRONCHIAL NEEDLE ASPIRATION BIOPSIES;  Surgeon: [name], DO;  Location: [facility];  Service: Pulmonary;;   BRONCHIAL WASHINGS  04/18/2020   Procedure: BRONCHIAL WASHINGS;  Surgeon: [name], DO;  Location: [facility];  Service: Pulmonary;;   CHOLECYSTECTOMY  2007   Gall Bladder   COLONOSCOPY W/ BIOPSIES AND POLYPECTOMY     EYE SURGERY Bilateral    laser for glaucoma both eyesn x 2   heart stent  Feb. 14, 2005   IR IMAGING GUIDED PORT INSERTION  05/03/2020   THORACENTESIS Left 04/18/2020   Procedure: THORACENTESIS;  Surgeon: [name], DO;  Location: [facility];  Service: Pulmonary;  Laterality: Left;   VIDEO BRONCHOSCOPY WITH  ENDOBRONCHIAL NAVIGATION Bilateral 04/18/2020   Procedure: VIDEO BRONCHOSCOPY WITH ENDOBRONCHIAL NAVIGATION;  Surgeon: [name], DO;  Location: [facility];  Service: Pulmonary;  Laterality: Bilateral;    REVIEW OF SYSTEMS:   Review of Systems  Constitutional: Positive for fatigue. Negative for appetite change, chills, fatigue, fever and unexpected weight change. HENT: Positive for post nasal drainage. Negative for mouth sores, nosebleeds, sore throat Eyes: Negative for eye problems and icterus. Respiratory: Positive for baseline dyspnea on exertion and cough. Negative for hemoptysis and wheezing.   Cardiovascular: Negative for chest pain and leg swelling. Gastrointestinal:  Negative for abdominal pain, constipation, nausea/vomiting and diarrhea. Genitourinary: Negative for bladder incontinence, difficulty urinating, dysuria, frequency and hematuria.   Musculoskeletal: Negative for back pain, gait problem, neck pain and neck stiffness. Skin: Negative for itching and rash. Neurological: Negative for dizziness, extremity weakness, gait problem, headaches,  and seizures.  Hematological: Negative for adenopathy. Does not bruise/bleed easily. Psychiatric/Behavioral: Negative for confusion, depression and sleep disturbance. The patient is not nervous/anxious.      PHYSICAL EXAMINATION:  There were no vitals taken for this visit.  ECOG PERFORMANCE STATUS: 1  Physical Exam  Constitutional: Oriented to person, place, and time and well-developed, well-nourished, and in no distress. HENT: Head: Normocephalic and atraumatic. Mouth/Throat: Oropharynx is clear and moist. No oropharyngeal exudate. Eyes: Conjunctivae are normal. Right eye exhibits no discharge. Left eye exhibits no discharge. No scleral icterus. Neck: Normal range of motion. Neck supple. Cardiovascular: Normal rate, regular rhythm, normal heart sounds and intact distal pulses.   Pulmonary/Chest: Effort normal and breath  sounds normal except decreased breath sounds in left lower lobe. No respiratory distress. No wheezes. No rales. Abdominal: Soft. Bowel sounds are normal. Exhibits no distension and no mass. There is no tenderness.  Musculoskeletal: Normal range of motion. Exhibits no edema.  Lymphadenopathy:    No cervical adenopathy.  Neurological: Alert and oriented to person, place, and time. Exhibits normal muscle tone. Gait normal. Coordination normal. Skin: Skin is warm and dry. No rash noted. Not diaphoretic. No erythema. No pallor.  Psychiatric: Mood, memory and judgment normal. Vitals reviewed.  LABORATORY DATA: Lab Results  Component Value Date   WBC 9.9 08/17/2020   HGB 12.0 (L) 08/17/2020   HCT 36.0 (L) 08/17/2020   MCV 95.2 08/17/2020   PLT 211 08/17/2020      Chemistry      Component Value Date/Time   NA 136 08/17/2020 1039   K 4.1 08/17/2020 1039   CL 103 08/17/2020 1039   CO2 24 08/17/2020 1039   BUN 13 08/17/2020 1039   CREATININE 0.84 08/17/2020 1039      Component Value Date/Time   CALCIUM 9.1 08/17/2020 1039   ALKPHOS 76 08/17/2020 1039   AST 14 (L) 08/17/2020 1039   ALT 12 08/17/2020 1039   BILITOT 0.3 08/17/2020 1039       RADIOGRAPHIC STUDIES:  No results found.   ASSESSMENT/PLAN:  This is a very pleasant 75 year old Caucasian male diagnosed with stage IV non-Sharp cell lung cancer, squamous cell carcinoma.  The patient presented with a right upper lobe lung mass, left upper lobe lung mass, loculated left pleural effusion, mild hypermetabolism in the right hilar anterior mediastinal lymph nodes, and a few Sharp metastatic bone lesions on C2, L5, right ribs, and right iliac crest.  He was diagnosed in March 2022.  His PD-L1 expression is 0%.    The patient is currently undergoing systemic chemotherapy with carboplatin for AUC of 5, paclitaxel 175 mg/M2 and Keytruda 200 mg IV every 3 weeks with Neulasta support status post 6 cycles of treatment.  Starting from  cycle #5, the patient has been on maintenance single agent immunotherapy with Keytruda.   He completed palliative radiotherapy for the hemoptysis under the care of Dr. Lisbeth Sharp.  This was completed on 06/24/2020.  The patient was seen with Dr. Julien Nordmann. The patient recently had a restaging CT scan performed. Dr. Julien Nordmann personally and independently reviewed the scan and discussed the results with the patient today. The scan showed no evidence for disease progression except for some increase in the loculated left pleural effusion. Dr. Julien Nordmann recommends that he continue on the same treatment at the same dose. He will proceed with cycle #7 today as scheduled.   We will see him back for follow-up visit in 3 weeks for evaluation and to review his scan results before starting  cycle #8.  The patient is not interested in gabapentin for his mild neuropathy. It does not interfere with this daily activities. If he changes his mind, he knows how to reach our office.   The patient was advised to call immediately if she has any concerning symptoms in the interval. The patient voices understanding of current disease status and treatment options and is in agreement with the current care plan. All questions were answered. The patient knows to call the clinic with any problems, questions or concerns. We can certainly see the patient much sooner if necessary    No orders of the defined types were placed in this encounter.      Mylena Sedberry L Rozalia Dino, PA-C 09/04/20  ADDENDUM: Hematology/Oncology Attending: I had a face-to-face encounter with the patient today.  I reviewed his record, lab, scan and recommended his care plan.  This is a very pleasant 75 years old white male diagnosed with stage IV non-Sharp cell lung cancer, squamous cell carcinoma presented with right upper lobe lung mass in addition to left upper lobe mass as well as loculated left pleural effusion with right hilar and mediastinal  lymphadenopathy and metastatic bone lesions diagnosed in March 2022 with PD-L1 expression of 0%. The patient started systemic chemotherapy with induction carboplatin for AUC of 5, paclitaxel 175 Mg/M2 and Keytruda 200 Mg IV every 3 weeks status post 4 cycles of the induction phase and he is currently on maintenance treatment with single agent Keytruda for 2 more cycles. He has been tolerating his maintenance treatment fairly well with no significant adverse effect except for mild fatigue. He had repeat CT scan of the chest, abdomen pelvis performed recently.  I personally and independently reviewed the scans and discussed the results with the patient today. His scan showed no concerning findings for disease progression and in general he has a stable disease. I recommended for the patient to continue his current maintenance treatment with single agent Keytruda every 3 weeks and he will proceed with cycle #7 today. The patient will come back for follow-up visit in 3 weeks for evaluation before the next cycle of his treatment. He was advised to call immediately if he has any other concerning symptoms in the interval.   The total time spent in the appointment was 32 minutes. Disclaimer: This note was dictated with voice recognition software. Similar sounding words can inadvertently be transcribed and may be missed upon review. Eilleen Kempf, MD 09/07/20

## 2020-09-05 ENCOUNTER — Encounter (HOSPITAL_COMMUNITY): Payer: Self-pay

## 2020-09-05 ENCOUNTER — Ambulatory Visit (HOSPITAL_COMMUNITY)
Admission: RE | Admit: 2020-09-05 | Discharge: 2020-09-05 | Disposition: A | Discharge status: Home / Self Care | Payer: Medicare Other | Source: Ambulatory Visit | Attending: Physician Assistant | Admitting: Physician Assistant

## 2020-09-05 ENCOUNTER — Other Ambulatory Visit: Payer: Self-pay

## 2020-09-05 DIAGNOSIS — C3491 Malignant neoplasm of unspecified part of right bronchus or lung: Secondary | ICD-10-CM | POA: Insufficient documentation

## 2020-09-05 DIAGNOSIS — C3490 Malignant neoplasm of unspecified part of unspecified bronchus or lung: Secondary | ICD-10-CM | POA: Diagnosis not present

## 2020-09-05 DIAGNOSIS — I700 Atherosclerosis of aorta: Secondary | ICD-10-CM | POA: Diagnosis not present

## 2020-09-05 DIAGNOSIS — J439 Emphysema, unspecified: Secondary | ICD-10-CM | POA: Diagnosis not present

## 2020-09-05 DIAGNOSIS — J90 Pleural effusion, not elsewhere classified: Secondary | ICD-10-CM | POA: Diagnosis not present

## 2020-09-05 DIAGNOSIS — K5730 Diverticulosis of large intestine without perforation or abscess without bleeding: Secondary | ICD-10-CM | POA: Diagnosis not present

## 2020-09-05 MED ORDER — IOHEXOL 350 MG/ML SOLN
80.0000 mL | Freq: Once | INTRAVENOUS | Status: AC | PRN
  Administered 2020-09-05: 80 mL via INTRAVENOUS

## 2020-09-07 ENCOUNTER — Inpatient Hospital Stay: Payer: Medicare Other | Attending: Physician Assistant | Admitting: Physician Assistant

## 2020-09-07 ENCOUNTER — Encounter: Payer: Self-pay | Admitting: Physician Assistant

## 2020-09-07 ENCOUNTER — Inpatient Hospital Stay: Payer: Medicare Other

## 2020-09-07 ENCOUNTER — Other Ambulatory Visit: Payer: Self-pay

## 2020-09-07 VITALS — BP 113/65 | HR 67 | Temp 97.0°F | Resp 18 | Wt 151.7 lb

## 2020-09-07 DIAGNOSIS — Z95828 Presence of other vascular implants and grafts: Secondary | ICD-10-CM

## 2020-09-07 DIAGNOSIS — C3491 Malignant neoplasm of unspecified part of right bronchus or lung: Secondary | ICD-10-CM

## 2020-09-07 DIAGNOSIS — Z5112 Encounter for antineoplastic immunotherapy: Secondary | ICD-10-CM

## 2020-09-07 DIAGNOSIS — R5383 Other fatigue: Secondary | ICD-10-CM

## 2020-09-07 DIAGNOSIS — C7951 Secondary malignant neoplasm of bone: Secondary | ICD-10-CM | POA: Insufficient documentation

## 2020-09-07 DIAGNOSIS — J90 Pleural effusion, not elsewhere classified: Secondary | ICD-10-CM | POA: Insufficient documentation

## 2020-09-07 DIAGNOSIS — C3411 Malignant neoplasm of upper lobe, right bronchus or lung: Secondary | ICD-10-CM | POA: Insufficient documentation

## 2020-09-07 DIAGNOSIS — C3490 Malignant neoplasm of unspecified part of unspecified bronchus or lung: Secondary | ICD-10-CM

## 2020-09-07 DIAGNOSIS — Z79899 Other long term (current) drug therapy: Secondary | ICD-10-CM | POA: Insufficient documentation

## 2020-09-07 LAB — CMP (CANCER CENTER ONLY)
ALT: 9 U/L (ref 0–44)
AST: 13 U/L — ABNORMAL LOW (ref 15–41)
Albumin: 3.1 g/dL — ABNORMAL LOW (ref 3.5–5.0)
Alkaline Phosphatase: 78 U/L (ref 38–126)
Anion gap: 7 (ref 5–15)
BUN: 10 mg/dL (ref 8–23)
CO2: 21 mmol/L — ABNORMAL LOW (ref 22–32)
Calcium: 8.9 mg/dL (ref 8.9–10.3)
Chloride: 108 mmol/L (ref 98–111)
Creatinine: 0.85 mg/dL (ref 0.61–1.24)
GFR, Estimated: >60 mL/min (ref >60)
Glucose, Bld: 104 mg/dL — ABNORMAL HIGH (ref 70–99)
Potassium: 4.3 mmol/L (ref 3.5–5.1)
Sodium: 136 mmol/L (ref 135–145)
Total Bilirubin: 0.3 mg/dL (ref 0.3–1.2)
Total Protein: 6.8 g/dL (ref 6.5–8.1)

## 2020-09-07 LAB — CBC WITH DIFFERENTIAL (CANCER CENTER ONLY)
Abs Immature Granulocytes: 0.02 10*3/uL (ref 0.00–0.07)
Basophils Absolute: 0 10*3/uL (ref 0.0–0.1)
Basophils Relative: 0 %
Eosinophils Absolute: 0.4 10*3/uL (ref 0.0–0.5)
Eosinophils Relative: 5 %
HCT: 34.8 % — ABNORMAL LOW (ref 39.0–52.0)
Hemoglobin: 11.6 g/dL — ABNORMAL LOW (ref 13.0–17.0)
Immature Granulocytes: 0 %
Lymphocytes Relative: 6 %
Lymphs Abs: 0.5 10*3/uL — ABNORMAL LOW (ref 0.7–4.0)
MCH: 31.6 pg (ref 26.0–34.0)
MCHC: 33.3 g/dL (ref 30.0–36.0)
MCV: 94.8 fL (ref 80.0–100.0)
Monocytes Absolute: 0.6 10*3/uL (ref 0.1–1.0)
Monocytes Relative: 7 %
Neutro Abs: 7 10*3/uL (ref 1.7–7.7)
Neutrophils Relative %: 82 %
Platelet Count: 233 10*3/uL (ref 150–400)
RBC: 3.67 MIL/uL — ABNORMAL LOW (ref 4.22–5.81)
RDW: 13.5 % (ref 11.5–15.5)
WBC Count: 8.5 10*3/uL (ref 4.0–10.5)
nRBC: 0 % (ref 0.0–0.2)

## 2020-09-07 LAB — TSH: TSH: 1.808 u[IU]/mL (ref 0.320–4.118)

## 2020-09-07 MED ORDER — SODIUM CHLORIDE 0.9% FLUSH
10.0000 mL | INTRAVENOUS | Status: DC | PRN
  Administered 2020-09-07: 10 mL
  Filled 2020-09-07: qty 10

## 2020-09-07 MED ORDER — SODIUM CHLORIDE 0.9 % IV SOLN
Freq: Once | INTRAVENOUS | Status: AC
  Filled 2020-09-07: qty 250

## 2020-09-07 MED ORDER — SODIUM CHLORIDE 0.9 % IV SOLN
200.0000 mg | Freq: Once | INTRAVENOUS | Status: AC
  Administered 2020-09-07: 200 mg via INTRAVENOUS
  Filled 2020-09-07: qty 8

## 2020-09-07 MED ORDER — SODIUM CHLORIDE 0.9% FLUSH
10.0000 mL | Freq: Once | INTRAVENOUS | Status: AC
  Administered 2020-09-07: 10 mL
  Filled 2020-09-07: qty 10

## 2020-09-07 MED ORDER — HEPARIN SOD (PORK) LOCK FLUSH 100 UNIT/ML IV SOLN
500.0000 [IU] | Freq: Once | INTRAVENOUS | Status: AC | PRN
  Administered 2020-09-07: 500 [IU]
  Filled 2020-09-07: qty 5

## 2020-09-07 NOTE — Patient Instructions (Signed)
[facility] ONCOLOGY  Discharge Instructions: Thank you for choosing [facility] to provide your oncology and hematology care.   If you have a lab appointment with the [facility], please go directly to the [facility] and check in at the registration area.   Wear comfortable clothing and clothing appropriate for easy access to any Portacath or PICC line.   We strive to give you quality time with your provider. You may need to reschedule your appointment if you arrive late (15 or more minutes).  Arriving late affects you and other patients whose appointments are after yours.  Also, if you miss three or more appointments without notifying the office, you may be dismissed from the clinic at the provider's discretion.      For prescription refill requests, have your pharmacy contact our office and allow 72 hours for refills to be completed.    Today you received the following chemotherapy and/or immunotherapy agents Beryle Flock      To help prevent nausea and vomiting after your treatment, we encourage you to take your nausea medication as directed.  BELOW ARE SYMPTOMS THAT SHOULD BE REPORTED IMMEDIATELY: *FEVER GREATER THAN 100.4 F (38 C) OR HIGHER *CHILLS OR SWEATING *NAUSEA AND VOMITING THAT IS NOT CONTROLLED WITH YOUR NAUSEA MEDICATION *UNUSUAL SHORTNESS OF BREATH *UNUSUAL BRUISING OR BLEEDING *URINARY PROBLEMS (pain or burning when urinating, or frequent urination) *BOWEL PROBLEMS (unusual diarrhea, constipation, pain near the anus) TENDERNESS IN MOUTH AND THROAT WITH OR WITHOUT PRESENCE OF ULCERS (sore throat, sores in mouth, or a toothache) UNUSUAL RASH, SWELLING OR PAIN  UNUSUAL VAGINAL DISCHARGE OR ITCHING   Items with * indicate a potential emergency and should be followed up as soon as possible or go to the Emergency Department if any problems should occur.  Please show the CHEMOTHERAPY ALERT CARD or IMMUNOTHERAPY ALERT CARD at check-in to  the Emergency Department and triage nurse.  Should you have questions after your visit or need to cancel or reschedule your appointment, please contact [facility]  Dept: [phone]  and follow the prompts.  Office hours are 8:00 a.m. to 4:30 p.m. Monday - Friday. Please note that voicemails left after 4:00 p.m. may not be returned until the following business day.  We are closed weekends and major holidays. You have access to a nurse at all times for urgent questions. Please call the main number to the clinic Dept: [phone] and follow the prompts.   For any non-urgent questions, you may also contact your provider using MyChart. We now offer e-Visits for anyone 51 and older to request care online for non-urgent symptoms. For details visit [email or web address].   Also download the MyChart app! Go to the app store, search "MyChart", open the app, select [facility], and log in with your MyChart username and password.  Due to Covid, a mask is required upon entering the hospital/clinic. If you do not have a mask, one will be given to you upon arrival. For doctor visits, patients may have 1 support person aged 14 or older with them. For treatment visits, patients cannot have anyone with them due to current Covid guidelines and our immunocompromised population.

## 2020-09-15 DIAGNOSIS — F17201 Nicotine dependence, unspecified, in remission: Secondary | ICD-10-CM | POA: Diagnosis not present

## 2020-09-15 DIAGNOSIS — E782 Mixed hyperlipidemia: Secondary | ICD-10-CM | POA: Diagnosis not present

## 2020-09-15 DIAGNOSIS — Z0000 Encounter for general adult medical examination without abnormal findings: Secondary | ICD-10-CM | POA: Diagnosis not present

## 2020-09-15 DIAGNOSIS — C3491 Malignant neoplasm of unspecified part of right bronchus or lung: Secondary | ICD-10-CM | POA: Diagnosis not present

## 2020-09-15 DIAGNOSIS — I2510 Atherosclerotic heart disease of native coronary artery without angina pectoris: Secondary | ICD-10-CM | POA: Diagnosis not present

## 2020-09-15 DIAGNOSIS — I10 Essential (primary) hypertension: Secondary | ICD-10-CM | POA: Diagnosis not present

## 2020-09-15 DIAGNOSIS — I714 Abdominal aortic aneurysm, without rupture: Secondary | ICD-10-CM | POA: Diagnosis not present

## 2020-09-19 ENCOUNTER — Emergency Department (HOSPITAL_BASED_OUTPATIENT_CLINIC_OR_DEPARTMENT_OTHER): Payer: Medicare Other

## 2020-09-19 ENCOUNTER — Telehealth: Payer: Self-pay | Admitting: Medical Oncology

## 2020-09-19 ENCOUNTER — Encounter (HOSPITAL_BASED_OUTPATIENT_CLINIC_OR_DEPARTMENT_OTHER): Payer: Self-pay

## 2020-09-19 ENCOUNTER — Emergency Department (HOSPITAL_BASED_OUTPATIENT_CLINIC_OR_DEPARTMENT_OTHER)
Admission: EM | Admit: 2020-09-19 | Discharge: 2020-09-19 | Disposition: A | Discharge status: Home / Self Care | Payer: Medicare Other | Attending: Emergency Medicine | Admitting: Emergency Medicine

## 2020-09-19 ENCOUNTER — Other Ambulatory Visit: Payer: Self-pay

## 2020-09-19 DIAGNOSIS — R042 Hemoptysis: Secondary | ICD-10-CM | POA: Insufficient documentation

## 2020-09-19 DIAGNOSIS — Z85118 Personal history of other malignant neoplasm of bronchus and lung: Secondary | ICD-10-CM | POA: Diagnosis not present

## 2020-09-19 DIAGNOSIS — Z87891 Personal history of nicotine dependence: Secondary | ICD-10-CM | POA: Insufficient documentation

## 2020-09-19 DIAGNOSIS — I700 Atherosclerosis of aorta: Secondary | ICD-10-CM | POA: Diagnosis not present

## 2020-09-19 DIAGNOSIS — Z7982 Long term (current) use of aspirin: Secondary | ICD-10-CM | POA: Diagnosis not present

## 2020-09-19 DIAGNOSIS — J90 Pleural effusion, not elsewhere classified: Secondary | ICD-10-CM | POA: Diagnosis not present

## 2020-09-19 DIAGNOSIS — J9811 Atelectasis: Secondary | ICD-10-CM | POA: Diagnosis not present

## 2020-09-19 DIAGNOSIS — Z79899 Other long term (current) drug therapy: Secondary | ICD-10-CM | POA: Diagnosis not present

## 2020-09-19 DIAGNOSIS — I2510 Atherosclerotic heart disease of native coronary artery without angina pectoris: Secondary | ICD-10-CM | POA: Insufficient documentation

## 2020-09-19 DIAGNOSIS — I10 Essential (primary) hypertension: Secondary | ICD-10-CM | POA: Insufficient documentation

## 2020-09-19 DIAGNOSIS — Z85820 Personal history of malignant melanoma of skin: Secondary | ICD-10-CM | POA: Insufficient documentation

## 2020-09-19 DIAGNOSIS — R059 Cough, unspecified: Secondary | ICD-10-CM | POA: Diagnosis not present

## 2020-09-19 LAB — CBC WITH DIFFERENTIAL/PLATELET
Abs Immature Granulocytes: 0.03 10*3/uL (ref 0.00–0.07)
Basophils Absolute: 0.1 10*3/uL (ref 0.0–0.1)
Basophils Relative: 1 %
Eosinophils Absolute: 0.3 10*3/uL (ref 0.0–0.5)
Eosinophils Relative: 4 %
HCT: 38.6 % — ABNORMAL LOW (ref 39.0–52.0)
Hemoglobin: 12.7 g/dL — ABNORMAL LOW (ref 13.0–17.0)
Immature Granulocytes: 0 %
Lymphocytes Relative: 6 %
Lymphs Abs: 0.5 10*3/uL — ABNORMAL LOW (ref 0.7–4.0)
MCH: 31.1 pg (ref 26.0–34.0)
MCHC: 32.9 g/dL (ref 30.0–36.0)
MCV: 94.4 fL (ref 80.0–100.0)
Monocytes Absolute: 0.7 10*3/uL (ref 0.1–1.0)
Monocytes Relative: 9 %
Neutro Abs: 6.1 10*3/uL (ref 1.7–7.7)
Neutrophils Relative %: 80 %
Platelets: 228 10*3/uL (ref 150–400)
RBC: 4.09 MIL/uL — ABNORMAL LOW (ref 4.22–5.81)
RDW: 13.2 % (ref 11.5–15.5)
WBC: 7.7 10*3/uL (ref 4.0–10.5)
nRBC: 0 % (ref 0.0–0.2)

## 2020-09-19 LAB — COMPREHENSIVE METABOLIC PANEL
ALT: 10 U/L (ref 0–44)
AST: 18 U/L (ref 15–41)
Albumin: 3.5 g/dL (ref 3.5–5.0)
Alkaline Phosphatase: 91 U/L (ref 38–126)
Anion gap: 9 (ref 5–15)
BUN: 13 mg/dL (ref 8–23)
CO2: 25 mmol/L (ref 22–32)
Calcium: 9 mg/dL (ref 8.9–10.3)
Chloride: 98 mmol/L (ref 98–111)
Creatinine, Ser: 0.93 mg/dL (ref 0.61–1.24)
GFR, Estimated: >60 mL/min (ref >60)
Glucose, Bld: 108 mg/dL — ABNORMAL HIGH (ref 70–99)
Potassium: 4.7 mmol/L (ref 3.5–5.1)
Sodium: 132 mmol/L — ABNORMAL LOW (ref 135–145)
Total Bilirubin: 0.5 mg/dL (ref 0.3–1.2)
Total Protein: 7 g/dL (ref 6.5–8.1)

## 2020-09-19 MED ORDER — IOHEXOL 350 MG/ML SOLN: 75.0000 mL | Freq: Once | INTRAVENOUS | Status: DC | PRN

## 2020-09-19 MED ORDER — IOHEXOL 350 MG/ML SOLN
100.0000 mL | Freq: Once | INTRAVENOUS | Status: AC | PRN
  Administered 2020-09-19: 75 mL via INTRAVENOUS

## 2020-09-19 NOTE — Discharge Instructions (Addendum)
Return for worsening of the coughing up blood.  Tickly fits a full cup of blood in a day.  Follow back up with hematology oncology as scheduled.  Today CT head no significant changes.

## 2020-09-19 NOTE — ED Provider Notes (Signed)
[facility] Provider Note   CSN: [id number] Arrival date & time: 09/19/20  1209     History Chief Complaint  Patient presents with   Hemoptysis    Riley Sharp is a 75 y.o. male.  The history is provided by the patient.  Illness Quality:  General Severity:  Mild Onset quality:  Gradual Duration:  3 days Timing:  Intermittent Progression:  Waxing and waning Chronicity:  New Context:  Patient with history of lung cancer currently undergoing immunotherapy presents to the ED after some episodes of hemoptysis.  States that he is coughing up a little bit of blood in his tissue paper when coughing these last several days.  No large volumes. Not on blood thinner, no PE hx Relieved by:  Nothig Worsened by:  Nothing Associated symptoms: cough   Associated symptoms: no abdominal pain, no chest pain, no ear pain, no fever, no rash, no shortness of breath, no sore throat and no vomiting   Risk factors:  Liung cancer     Past Medical History:  Diagnosis Date   AAA (abdominal aortic aneurysm) (HCC)    CAD (coronary artery disease)    Cancer ([place])    basal cell carcimona right ear and upper left at shoulder   Glaucoma    Hyperlipidemia    Hypertension    Myocardial infarction ([place]) 03/2003   Wears glasses     Patient Active Problem List   Diagnosis Date Noted   Allergic rhinitis 06/13/2020   Benign prostatic hyperplasia 06/13/2020   Macrocytosis 06/13/2020   Malignant neoplasm of lung ([place]) 06/13/2020   Other specified diseases of blood and blood-forming organs 06/13/2020   Personal history of colonic polyps 06/13/2020   Sciatica 06/13/2020   Secondary malignant neoplasm of left lung ([place]) 06/13/2020   Secondary malignant neoplasm of right lung ([place]) 06/13/2020   Tobacco user 06/13/2020   Hemoptysis 05/25/2020   Port-A-Cath in place 05/18/2020   Squamous cell carcinoma of lung, stage IV, right ([place]) 04/26/2020   Goals of care,  counseling/discussion 04/26/2020   Encounter for antineoplastic chemotherapy 04/26/2020   Encounter for antineoplastic immunotherapy 04/26/2020   Pleural effusion    Lung mass 04/07/2020   Hypercholesterolemia 11/09/2016   AAA (abdominal aortic aneurysm) without rupture ([place]) 11/14/2015   Atherosclerotic heart disease of native coronary artery without angina pectoris 11/24/2014   Low-tension glaucoma of left eye, mild stage 01/15/2014   Low-tension glaucoma of right eye, moderate stage 01/15/2014   Abdominal aneurysm without mention of rupture 02/05/2013   Bilateral dry eyes 01/09/2013   Nuclear sclerotic cataract of both eyes 01/09/2013   Pain in limb 02/04/2012   AAA (abdominal aortic aneurysm) ([place]) 11/23/2010    Past Surgical History:  Procedure Laterality Date   ABDOMINAL AORTIC ENDOVASCULAR STENT GRAFT N/A 11/14/2015   Procedure: ABDOMINAL AORTIC ENDOVASCULAR STENT GRAFT;  Surgeon: Elam Dutch, MD;  Location: [facility];  Service: Vascular;  Laterality: N/A;   APPENDECTOMY     BRONCHIAL BIOPSY  04/18/2020   Procedure: BRONCHIAL BIOPSIES;  Surgeon: [name], DO;  Location: [facility];  Service: Pulmonary;;   BRONCHIAL BRUSHINGS  04/18/2020   Procedure: BRONCHIAL BRUSHINGS;  Surgeon: [name], DO;  Location: [facility];  Service: Pulmonary;;   BRONCHIAL NEEDLE ASPIRATION BIOPSY  04/18/2020   Procedure: BRONCHIAL NEEDLE ASPIRATION BIOPSIES;  Surgeon: [name], DO;  Location: [facility];  Service: Pulmonary;;   BRONCHIAL WASHINGS  04/18/2020   Procedure: BRONCHIAL WASHINGS;  Surgeon:  [name], DO;  Location: [facility];  Service: Pulmonary;;   CHOLECYSTECTOMY  2007   Gall Bladder   COLONOSCOPY W/ BIOPSIES AND POLYPECTOMY     EYE SURGERY Bilateral    laser for glaucoma both eyesn x 2   heart stent  Feb. 14, 2005   IR IMAGING GUIDED PORT INSERTION  05/03/2020   THORACENTESIS Left 04/18/2020   Procedure: THORACENTESIS;  Surgeon: [name],  DO;  Location: [facility];  Service: Pulmonary;  Laterality: Left;   VIDEO BRONCHOSCOPY WITH ENDOBRONCHIAL NAVIGATION Bilateral 04/18/2020   Procedure: VIDEO BRONCHOSCOPY WITH ENDOBRONCHIAL NAVIGATION;  Surgeon: [name], DO;  Location: [facility];  Service: Pulmonary;  Laterality: Bilateral;       Family History  Problem Relation Age of Onset   Cancer Father    Heart disease Father        After age 32   Hyperlipidemia Father    Hypertension Father    Heart attack Father    Heart disease Sister        After age 40   Hyperlipidemia Sister    Heart attack Sister    Hypertension Other    Heart disease Other    Cancer Other     Social History   Tobacco Use   Smoking status: Former    Packs/day: 2.00    Years: 45.00    Pack years: 90.00    Types: Cigars, Cigarettes   Smokeless tobacco: Never   Tobacco comments:    2-3 cigars per day--quit Jan 2022  Vaping Use   Vaping Use: Never used  Substance Use Topics   Alcohol use: Yes    Alcohol/week: 0.0 standard drinks    Comment: rare   Drug use: No    Home Medications Prior to Admission medications   Medication Sig Start Date End Date Taking? Authorizing Provider  albuterol (PROVENTIL) (2.5 MG/3ML) 0.083% nebulizer solution Take 3 mLs (2.5 mg total) by nebulization every 6 (six) hours as needed for wheezing or shortness of breath. 06/13/20   Icard, Octavio Graves, DO  albuterol (VENTOLIN HFA) 108 (90 Base) MCG/ACT inhaler Inhale 2 puffs into the lungs every 6 (six) hours as needed for wheezing or shortness of breath. 06/13/20   [name], DO  aspirin EC 81 MG tablet Take 81 mg by mouth daily.    [provider]  brimonidine (ALPHAGAN) 0.2 % ophthalmic solution Place 1 drop into both eyes 3 (three) times daily.    [provider]  dorzolamide (TRUSOPT) 2 % ophthalmic solution Place 1 drop into both eyes 3 (three) times daily.    [provider]  latanoprost (XALATAN) 0.005 % ophthalmic  solution Place 1 drop into both eyes at bedtime.    [provider]  lidocaine-prilocaine (EMLA) cream Apply 1 application topically as needed. 04/26/20   Heilingoetter, Cassandra L, PA-C  loratadine (CLARITIN) 10 MG tablet Take 10 mg by mouth daily.    [provider]  metoprolol succinate (TOPROL-XL) 25 MG 24 hr tablet Take 25 mg by mouth daily.    [provider]  ondansetron (ZOFRAN) 8 MG tablet Take 1 tablet by mouth every 8 (eight) hours as needed. 06/26/20   [provider]  prochlorperazine (COMPAZINE) 10 MG tablet Take 1 tablet (10 mg total) by mouth every 6 (six) hours as needed. 04/26/20   Heilingoetter, Cassandra L, PA-C  ramipril (ALTACE) 5 MG tablet Take 5 mg by mouth daily.    [provider]  rosuvastatin (CRESTOR) 10 MG tablet Take 10 mg by mouth daily.    [provider]  timolol (TIMOPTIC) 0.5 % ophthalmic solution Place 1 drop into both eyes daily. 02/23/20   [provider]  Tiotropium Bromide-Olodaterol 2.5-2.5 MCG/ACT AERS Inhale 2 puffs into the lungs daily. 06/13/20   [name], DO    Allergies    Patient has no known allergies.  Review of Systems   Review of Systems  Constitutional:  Negative for chills and fever.  HENT:  Negative for ear pain and sore throat.   Eyes:  Negative for pain and visual disturbance.  Respiratory:  Positive for cough. Negative for shortness of breath.   Cardiovascular:  Negative for chest pain and palpitations.  Gastrointestinal:  Negative for abdominal pain and vomiting.  Genitourinary:  Negative for dysuria and hematuria.  Musculoskeletal:  Negative for arthralgias and back pain.  Skin:  Negative for color change and rash.  Neurological:  Negative for seizures and syncope.  All other systems reviewed and are negative.  Physical Exam Updated Vital Signs BP 127/69 (BP Location: Right Arm)   Pulse 88   Temp 98.4 F (36.9 C) (Oral)   Resp 18   Ht 5\' 5"  (1.651 m)    Wt 68.8 kg   SpO2 98%   BMI 25.24 kg/m   Physical Exam Vitals and nursing note reviewed.  Constitutional:      General: He is not in acute distress.    Appearance: He is well-developed. He is not ill-appearing.  HENT:     Head: Normocephalic and atraumatic.     Nose: Nose normal.     Mouth/Throat:     Mouth: Mucous membranes are moist.  Eyes:     Extraocular Movements: Extraocular movements intact.     Conjunctiva/sclera: Conjunctivae normal.     Pupils: Pupils are equal, round, and reactive to light.  Cardiovascular:     Rate and Rhythm: Normal rate and regular rhythm.     Pulses: Normal pulses.     Heart sounds: No murmur heard. Pulmonary:     Effort: No respiratory distress.     Comments: Diminished breath sounds Abdominal:     Palpations: Abdomen is soft.     Tenderness: There is no abdominal tenderness.  Musculoskeletal:        General: Normal range of motion.     Cervical back: Normal range of motion and neck supple.  Skin:    General: Skin is warm and dry.     Capillary Refill: Capillary refill takes less than 2 seconds.  Neurological:     General: No focal deficit present.     Mental Status: He is alert.  Psychiatric:        Mood and Affect: Mood normal.    ED Results / Procedures / Treatments   Labs (all labs ordered are listed, but only abnormal results are displayed) Labs Reviewed  COMPREHENSIVE METABOLIC PANEL - Abnormal; Notable for the following components:      Result Value   Sodium 132 (*)    Glucose, Bld 108 (*)    All other components within normal limits  CBC WITH DIFFERENTIAL/PLATELET - Abnormal; Notable for the following components:   RBC 4.09 (*)    Hemoglobin 12.7 (*)    HCT 38.6 (*)    Lymphs Abs 0.5 (*)    All other components within normal limits  CBC WITH DIFFERENTIAL/PLATELET    EKG EKG Interpretation  Date/Time:  Monday September 19 2020  12:22:55 EDT Ventricular Rate:  98 PR Interval:  180 QRS Duration: 86 QT  Interval:  350 QTC Calculation: 446 R Axis:   20 Text Interpretation: Normal sinus rhythm Low voltage QRS Cannot rule out Anterior infarct , age undetermined Abnormal ECG Confirmed by Lennice Sites [phone]) on 09/19/2020 12:35:12 PM  Radiology DG Chest 2 View  Result Date: 09/19/2020 CLINICAL DATA:  Coughing up blood EXAM: CHEST - 2 VIEW COMPARISON:  Chest radiograph 04/18/2020, CT chest 09/05/2020 FINDINGS: A right chest wall port is in place with the tip terminating in the region of the superior cavoatrial junction. The cardiomediastinal silhouette is grossly stable. There is a moderate to large left pleural effusion layering along the lateral chest wall, increased in size since 04/18/2020. There are patchy opacities throughout the aerated portion of the left lung a portion of which corresponds to the mass lesion seen on the prior CT. There is patchy opacity projecting over the right upper not seen on the prior study corresponding to the right apical lesion seen on the prior CT. There is no right effusion. There is no pneumothorax. The bones are stable. IMPRESSION: 1. Moderate-to-large left pleural effusion layering along the chest wall is increased in size since the prior radiograph, likely not significantly changed compared to the prior CT. 2. Patchy opacities throughout the aerated left lung correspond to the known mass lesion seen on the prior CT. Superimposed infection cannot be excluded. 3. Opacity in the right upper lung corresponds to the right apical lesion seen on the prior CT. Electronically Signed   By: Valetta Mole M.D.   On: 09/19/2020 13:02   CT Angio Chest PE W and/or Wo Contrast  Result Date: 09/19/2020 CLINICAL DATA:  Hemoptysis for 2 days, cough, stage IV squamous cell carcinoma bilateral upper lobes EXAM: CT ANGIOGRAPHY CHEST WITH CONTRAST TECHNIQUE: Multidetector CT imaging of the chest was performed using the standard protocol during bolus administration of intravenous contrast.  Multiplanar CT image reconstructions and MIPs were obtained to evaluate the vascular anatomy. CONTRAST:  21mL OMNIPAQUE IOHEXOL 350 MG/ML SOLN COMPARISON:  09/05/2020 FINDINGS: Cardiovascular: This is a technically adequate evaluation of the pulmonary vasculature. No filling defects or pulmonary emboli. The heart is unremarkable without pericardial effusion. No evidence of thoracic aortic aneurysm or dissection. Moderate atherosclerosis of the aorta and coronary vasculature unchanged. Mediastinum/Nodes: No enlarged mediastinal, hilar, or axillary lymph nodes. Thyroid gland, trachea, and esophagus demonstrate no significant findings. Lungs/Pleura: Large loculated left pleural effusion is again noted and unchanged. Bilateral upper lobe spiculated masses are again identified. Masses are as follows: Left upper lobe, 5.8 x 2.7 cm, image 31/7, previously 5.5 x 3.0 cm. Right upper lobe, 3.5 x 2.6 cm, image 25/7, previously 3.1 x 2.4 cm. Stable emphysema. Scattered areas of compressive atelectasis are seen throughout the left lung, stable. No acute airspace disease or pneumothorax. Hypoventilatory changes are seen dependently within the right lower lobe. Central airways are patent. Upper Abdomen: No acute abnormality. Musculoskeletal: No acute or destructive bony lesions. Reconstructed images demonstrate no additional findings. Review of the MIP images confirms the above findings. IMPRESSION: 1. No evidence of pulmonary embolus. 2. Bilateral upper lobe spiculated masses consistent with known lung cancer. Minimal increase in size since prior study. 3. Stable large loculated left pleural effusion. 4. Scattered areas of atelectasis and hypoventilatory change bilaterally. No acute airspace disease. 5. Aortic Atherosclerosis (ICD10-I70.0) and Emphysema (ICD10-J43.9). Electronically Signed   By: Randa Ngo M.D.   On: 09/19/2020 15:08    Procedures  Procedures   Medications Ordered in ED Medications  iohexol (OMNIPAQUE)  350 MG/ML injection 100 mL (75 mLs Intravenous Contrast Given 09/19/20 1424)    ED Course  I have reviewed the triage vital signs and the nursing notes.  Pertinent labs & imaging results that were available during my care of the patient were reviewed by me and considered in my medical decision making (see chart for details).    MDM Rules/Calculators/A&P                           Riley Sharp is here with hemoptysis.  Normal vitals.  No fever.  Has had some intermittent episodes of coughing up of blood last several days.  History of lung cancer.  Not on blood thinners.  Currently on immunotherapy.  Recent CT scan showed significant left-sided pleural effusion.  He is not having any respiratory symptoms.  Not having any chest pain or discomfort at this time.  Was directed to come to the ED by his oncology team given the hemoptysis with pain.  Pain is now resolved.  He has EKG without any ischemic changes.  Chest x-ray shows left-sided pleural effusion but no pneumothorax.  Blood work shows no significant anemia, electrolyte abnormality, kidney injury.  Overall he endorses very small amount of blood in his cough.  No large volume loss.  Hemoglobin is 12.7.  Talked on the phone with his oncologist Dr. Earlie Server who is agreeable to ruling out blood clot another issues on CT scan of his chest.  If CT of his chest is stable from prior CTs okay with outpatient follow-up.  Signed out to oncoming ED staff with CT PE pending.  This chart was dictated using voice recognition software.  Despite best efforts to proofread,  errors can occur which can change the documentation meaning.   Final Clinical Impression(s) / ED Diagnoses Final diagnoses:  Hemoptysis    Rx / DC Orders ED Discharge Orders     None        Lennice Sites, DO 09/19/20 1513

## 2020-09-19 NOTE — ED Triage Notes (Signed)
Pt c/o hemoptysis since Saturday. Undergoing treatment for lung cancer. C/o pain in right chest to right shoulder blade with increased shortness of breath.

## 2020-09-19 NOTE — ED Provider Notes (Addendum)
CT scan angio chest without any significant changes most importantly no signs of pulmonary embolus.  Patient has follow-up with hematology oncology already scheduled for August 31.  Patient will will get follow-up sooner if he starts coughing up more blood and in particular if the measurement is 1 cup in a day.  Patient's labs all stable here.   Fredia Sorrow, MD 09/19/20 1639    Fredia Sorrow, MD 09/19/20 1640

## 2020-09-19 NOTE — Telephone Encounter (Signed)
Hemoptysis w streaks of blood ,bright red blood and clots with coughing.  Also reports increased SOB with stabbing  pain from r ant chest through chest to under R shoulder blade/ribs. Started sat. through today .  Per Dr Julien Nordmann I instructed pt to go to [facility]  ED.

## 2020-09-28 ENCOUNTER — Inpatient Hospital Stay: Payer: Medicare Other

## 2020-09-28 ENCOUNTER — Inpatient Hospital Stay (HOSPITAL_BASED_OUTPATIENT_CLINIC_OR_DEPARTMENT_OTHER): Payer: Medicare Other | Admitting: Internal Medicine

## 2020-09-28 ENCOUNTER — Encounter: Payer: Self-pay | Admitting: Internal Medicine

## 2020-09-28 ENCOUNTER — Other Ambulatory Visit: Payer: Self-pay

## 2020-09-28 VITALS — BP 107/66 | HR 86 | Temp 97.4°F | Resp 18 | Ht 65.0 in | Wt 149.8 lb

## 2020-09-28 DIAGNOSIS — J90 Pleural effusion, not elsewhere classified: Secondary | ICD-10-CM | POA: Diagnosis not present

## 2020-09-28 DIAGNOSIS — Z95828 Presence of other vascular implants and grafts: Secondary | ICD-10-CM

## 2020-09-28 DIAGNOSIS — C3491 Malignant neoplasm of unspecified part of right bronchus or lung: Secondary | ICD-10-CM

## 2020-09-28 DIAGNOSIS — C7951 Secondary malignant neoplasm of bone: Secondary | ICD-10-CM | POA: Diagnosis not present

## 2020-09-28 DIAGNOSIS — Z5112 Encounter for antineoplastic immunotherapy: Secondary | ICD-10-CM | POA: Diagnosis not present

## 2020-09-28 DIAGNOSIS — Z79899 Other long term (current) drug therapy: Secondary | ICD-10-CM | POA: Diagnosis not present

## 2020-09-28 DIAGNOSIS — C3490 Malignant neoplasm of unspecified part of unspecified bronchus or lung: Secondary | ICD-10-CM

## 2020-09-28 DIAGNOSIS — R5383 Other fatigue: Secondary | ICD-10-CM

## 2020-09-28 DIAGNOSIS — C3411 Malignant neoplasm of upper lobe, right bronchus or lung: Secondary | ICD-10-CM | POA: Diagnosis not present

## 2020-09-28 LAB — CMP (CANCER CENTER ONLY)
ALT: 12 U/L (ref 0–44)
AST: 13 U/L — ABNORMAL LOW (ref 15–41)
Albumin: 3.2 g/dL — ABNORMAL LOW (ref 3.5–5.0)
Alkaline Phosphatase: 89 U/L (ref 38–126)
Anion gap: 9 (ref 5–15)
BUN: 17 mg/dL (ref 8–23)
CO2: 24 mmol/L (ref 22–32)
Calcium: 9.1 mg/dL (ref 8.9–10.3)
Chloride: 104 mmol/L (ref 98–111)
Creatinine: 1.06 mg/dL (ref 0.61–1.24)
GFR, Estimated: >60 mL/min (ref >60)
Glucose, Bld: 108 mg/dL — ABNORMAL HIGH (ref 70–99)
Potassium: 4 mmol/L (ref 3.5–5.1)
Sodium: 137 mmol/L (ref 135–145)
Total Bilirubin: 0.4 mg/dL (ref 0.3–1.2)
Total Protein: 6.9 g/dL (ref 6.5–8.1)

## 2020-09-28 LAB — CBC WITH DIFFERENTIAL (CANCER CENTER ONLY)
Abs Immature Granulocytes: 0.02 10*3/uL (ref 0.00–0.07)
Basophils Absolute: 0 10*3/uL (ref 0.0–0.1)
Basophils Relative: 0 %
Eosinophils Absolute: 0.3 10*3/uL (ref 0.0–0.5)
Eosinophils Relative: 3 %
HCT: 37.8 % — ABNORMAL LOW (ref 39.0–52.0)
Hemoglobin: 12.4 g/dL — ABNORMAL LOW (ref 13.0–17.0)
Immature Granulocytes: 0 %
Lymphocytes Relative: 6 %
Lymphs Abs: 0.6 10*3/uL — ABNORMAL LOW (ref 0.7–4.0)
MCH: 30.8 pg (ref 26.0–34.0)
MCHC: 32.8 g/dL (ref 30.0–36.0)
MCV: 94 fL (ref 80.0–100.0)
Monocytes Absolute: 0.7 10*3/uL (ref 0.1–1.0)
Monocytes Relative: 8 %
Neutro Abs: 7.5 10*3/uL (ref 1.7–7.7)
Neutrophils Relative %: 83 %
Platelet Count: 226 10*3/uL (ref 150–400)
RBC: 4.02 MIL/uL — ABNORMAL LOW (ref 4.22–5.81)
RDW: 12.9 % (ref 11.5–15.5)
WBC Count: 9.1 10*3/uL (ref 4.0–10.5)
nRBC: 0 % (ref 0.0–0.2)

## 2020-09-28 LAB — TSH: TSH: 2.93 u[IU]/mL (ref 0.320–4.118)

## 2020-09-28 MED ORDER — SODIUM CHLORIDE 0.9% FLUSH
10.0000 mL | Freq: Once | INTRAVENOUS | Status: AC
  Administered 2020-09-28: 10 mL

## 2020-09-28 MED ORDER — GABAPENTIN 100 MG PO CAPS: 100.0000 mg | ORAL_CAPSULE | Freq: Two times a day (BID) | ORAL | 1 refills | Status: DC

## 2020-09-28 MED ORDER — HEPARIN SOD (PORK) LOCK FLUSH 100 UNIT/ML IV SOLN
500.0000 [IU] | Freq: Once | INTRAVENOUS | Status: AC | PRN
  Administered 2020-09-28: 500 [IU]

## 2020-09-28 MED ORDER — PEMBROLIZUMAB CHEMO INJECTION 100 MG/4ML
200.0000 mg | Freq: Once | INTRAVENOUS | Status: AC
Start: 2020-09-28 — End: 2020-09-28
  Administered 2020-09-28: 200 mg via INTRAVENOUS
  Filled 2020-09-28: qty 8

## 2020-09-28 MED ORDER — SODIUM CHLORIDE 0.9 % IV SOLN: Freq: Once | INTRAVENOUS | Status: AC

## 2020-09-28 MED ORDER — SODIUM CHLORIDE 0.9% FLUSH
10.0000 mL | INTRAVENOUS | Status: DC | PRN
  Administered 2020-09-28: 10 mL

## 2020-09-28 NOTE — Patient Instructions (Signed)
[facility]  Discharge Instructions: ?Thank you for choosing [facility] to provide your oncology and hematology care.  ? ?If you have a lab appointment with the Cancer Center, please go directly to the Cancer Center and check in at the registration area. ?  ?Wear comfortable clothing and clothing appropriate for easy access to any Portacath or PICC line.  ? ?We strive to give you quality time with your provider. You may need to reschedule your appointment if you arrive late (15 or more minutes).  Arriving late affects you and other patients whose appointments are after yours.  Also, if you miss three or more appointments without notifying the office, you may be dismissed from the clinic at the provider?s discretion.    ?  ?For prescription refill requests, have your pharmacy contact our office and allow 72 hours for refills to be completed.   ? ?Today you received the following chemotherapy and/or immunotherapy agents: Keytruda ?  ?To help prevent nausea and vomiting after your treatment, we encourage you to take your nausea medication as directed. ? ?BELOW ARE SYMPTOMS THAT SHOULD BE REPORTED IMMEDIATELY: ?*FEVER GREATER THAN 100.4 F (38 ?C) OR HIGHER ?*CHILLS OR SWEATING ?*NAUSEA AND VOMITING THAT IS NOT CONTROLLED WITH YOUR NAUSEA MEDICATION ?*UNUSUAL SHORTNESS OF BREATH ?*UNUSUAL BRUISING OR BLEEDING ?*URINARY PROBLEMS (pain or burning when urinating, or frequent urination) ?*BOWEL PROBLEMS (unusual diarrhea, constipation, pain near the anus) ?TENDERNESS IN MOUTH AND THROAT WITH OR WITHOUT PRESENCE OF ULCERS (sore throat, sores in mouth, or a toothache) ?UNUSUAL RASH, SWELLING OR PAIN  ?UNUSUAL VAGINAL DISCHARGE OR ITCHING  ? ?Items with * indicate a potential emergency and should be followed up as soon as possible or go to the Emergency Department if any problems should occur. ? ?Please show the CHEMOTHERAPY ALERT CARD or IMMUNOTHERAPY ALERT CARD at check-in to the  Emergency Department and triage nurse. ? ?Should you have questions after your visit or need to cancel or reschedule your appointment, please contact [facility]  Dept: [phone]  and follow the prompts.  Office hours are 8:00 a.m. to 4:30 p.m. Monday - Friday. Please note that voicemails left after 4:00 p.m. may not be returned until the following business day.  We are closed weekends and major holidays. You have access to a nurse at all times for urgent questions. Please call the main number to the clinic Dept: [phone] and follow the prompts. ? ? ?For any non-urgent questions, you may also contact your provider using MyChart. We now offer e-Visits for anyone 18 and older to request care online for non-urgent symptoms. For details visit [email or web address]. ?  ?Also download the MyChart app! Go to the app store, search "MyChart", open the app, select [facility], and log in with your MyChart username and password. ? ?Due to Covid, a mask is required upon entering the hospital/clinic. If you do not have a mask, one will be given to you upon arrival. For doctor visits, patients may have 1 support person aged 18 or older with them. For treatment visits, patients cannot have anyone with them due to current Covid guidelines and our immunocompromised population.  ? ?

## 2020-09-28 NOTE — Progress Notes (Signed)
[name] [phone]) [phone]   Fax:([phone]) [phone]  OFFICE PROGRESS NOTE  [name], MD [address] [place] [address]  DIAGNOSIS: Stage IV non-small cell lung cancer, squamous cell carcinoma.  He presented with a right upper lobe lung mass, left upper lobe lung mass, and a loculated left pleural effusion.  He had mild hypermetabolism in the right hilar and paramediastinal lymphadenopathy.  He had a few small bone lesions on C2, L5, right ribs, and right iliac crest.  He was diagnosed in March 2022   PD-L1: 0%.   PRIOR THERAPY: None   CURRENT THERAPY: Systemic chemotherapy with carboplatin for an AUC of 5, paclitaxel 175 mg/m2, Keytruda 200 mg IV 3 weeks with Neulasta support.  First dose expected on 05/02/2020.  Status post 7 cycles  INTERVAL HISTORY: Riley Sharp 75 y.o. male returns to the clinic today for follow-up visit.  The patient is feeling fine today with no concerning complaints except for dry cough.  He was seen at the emergency department complaining of hemoptysis after severe cough.  He had CT angiogram of the chest at that time that was unremarkable for any changes.  He denied having any current chest pain, shortness of breath or hemoptysis.  He continues to have mild peripheral neuropathy.  He denied having any nausea, vomiting, diarrhea or constipation.  He has no headache or visual changes.  He is here today for evaluation before starting cycle #8 of his treatment with Keytruda.   MEDICAL HISTORY: Past Medical History:  Diagnosis Date   AAA (abdominal aortic aneurysm) (HCC)    CAD (coronary artery disease)    Cancer ([facility])    basal cell carcimona right ear and upper left at shoulder   Glaucoma    Hyperlipidemia    Hypertension    Myocardial infarction ([facility]) 03/2003   Wears glasses     ALLERGIES:  has No Known Allergies.  MEDICATIONS:  Current Outpatient Medications  Medication Sig Dispense Refill   albuterol  (PROVENTIL) (2.5 MG/3ML) 0.083% nebulizer solution Take 3 mLs (2.5 mg total) by nebulization every 6 (six) hours as needed for wheezing or shortness of breath. 75 mL 12   albuterol (VENTOLIN HFA) 108 (90 Base) MCG/ACT inhaler Inhale 2 puffs into the lungs every 6 (six) hours as needed for wheezing or shortness of breath. 8 g 6   aspirin EC 81 MG tablet Take 81 mg by mouth daily.     brimonidine (ALPHAGAN) 0.2 % ophthalmic solution Place 1 drop into both eyes 3 (three) times daily.     dorzolamide (TRUSOPT) 2 % ophthalmic solution Place 1 drop into both eyes 3 (three) times daily.     latanoprost (XALATAN) 0.005 % ophthalmic solution Place 1 drop into both eyes at bedtime.     lidocaine-prilocaine (EMLA) cream Apply 1 application topically as needed. 30 g 2   loratadine (CLARITIN) 10 MG tablet Take 10 mg by mouth daily.     metoprolol succinate (TOPROL-XL) 25 MG 24 hr tablet Take 25 mg by mouth daily.     ondansetron (ZOFRAN) 8 MG tablet Take 1 tablet by mouth every 8 (eight) hours as needed.     prochlorperazine (COMPAZINE) 10 MG tablet Take 1 tablet (10 mg total) by mouth every 6 (six) hours as needed. 30 tablet 2   ramipril (ALTACE) 5 MG tablet Take 5 mg by mouth daily.     rosuvastatin (CRESTOR) 10 MG tablet Take 10 mg  by mouth daily.     timolol (TIMOPTIC) 0.5 % ophthalmic solution Place 1 drop into both eyes daily.     Tiotropium Bromide-Olodaterol 2.5-2.5 MCG/ACT AERS Inhale 2 puffs into the lungs daily. 2 each 0   No current facility-administered medications for this visit.    SURGICAL HISTORY:  Past Surgical History:  Procedure Laterality Date   ABDOMINAL AORTIC ENDOVASCULAR STENT GRAFT N/A 11/14/2015   Procedure: ABDOMINAL AORTIC ENDOVASCULAR STENT GRAFT;  Surgeon: Elam Dutch, MD;  Location: [facility];  Service: Vascular;  Laterality: N/A;   APPENDECTOMY     BRONCHIAL BIOPSY  04/18/2020   Procedure: BRONCHIAL BIOPSIES;  Surgeon: [name], DO;  Location: [facility];   Service: Pulmonary;;   BRONCHIAL BRUSHINGS  04/18/2020   Procedure: BRONCHIAL BRUSHINGS;  Surgeon: [name], DO;  Location: [facility];  Service: Pulmonary;;   BRONCHIAL NEEDLE ASPIRATION BIOPSY  04/18/2020   Procedure: BRONCHIAL NEEDLE ASPIRATION BIOPSIES;  Surgeon: [name], DO;  Location: [facility];  Service: Pulmonary;;   BRONCHIAL WASHINGS  04/18/2020   Procedure: BRONCHIAL WASHINGS;  Surgeon: [name], DO;  Location: [facility];  Service: Pulmonary;;   CHOLECYSTECTOMY  2007   Gall Bladder   COLONOSCOPY W/ BIOPSIES AND POLYPECTOMY     EYE SURGERY Bilateral    laser for glaucoma both eyesn x 2   heart stent  Feb. 14, 2005   IR IMAGING GUIDED PORT INSERTION  05/03/2020   THORACENTESIS Left 04/18/2020   Procedure: THORACENTESIS;  Surgeon: [name], DO;  Location: [facility];  Service: Pulmonary;  Laterality: Left;   VIDEO BRONCHOSCOPY WITH ENDOBRONCHIAL NAVIGATION Bilateral 04/18/2020   Procedure: VIDEO BRONCHOSCOPY WITH ENDOBRONCHIAL NAVIGATION;  Surgeon: [name], DO;  Location: [facility];  Service: Pulmonary;  Laterality: Bilateral;    REVIEW OF SYSTEMS:  A comprehensive review of systems was negative except for: Respiratory: positive for cough Neurological: positive for paresthesia   PHYSICAL EXAMINATION: General appearance: alert, cooperative, fatigued, and no distress Head: Normocephalic, without obvious abnormality, atraumatic Neck: no adenopathy, no JVD, supple, symmetrical, trachea midline, and thyroid not enlarged, symmetric, no tenderness/mass/nodules Lymph nodes: Cervical, supraclavicular, and axillary nodes normal. Resp: clear to auscultation bilaterally Back: symmetric, no curvature. ROM normal. No CVA tenderness. Cardio: regular rate and rhythm, S1, S2 normal, no murmur, click, rub or gallop GI: soft, non-tender; bowel sounds normal; no masses,  no organomegaly Extremities: extremities normal, atraumatic, no cyanosis or  edema  ECOG PERFORMANCE STATUS: 1 - Symptomatic but completely ambulatory  Blood pressure 107/66, pulse 86, temperature (!) 97.4 F (36.3 C), temperature source Tympanic, resp. rate 18, height $RemoveBe'5\' 5"'YMeGVOHES$  (1.651 m), weight 149 lb 12.8 oz (67.9 kg), SpO2 100 %.  LABORATORY DATA: Lab Results  Component Value Date   WBC 9.1 09/28/2020   HGB 12.4 (L) 09/28/2020   HCT 37.8 (L) 09/28/2020   MCV 94.0 09/28/2020   PLT 226 09/28/2020      Chemistry      Component Value Date/Time   NA 132 (L) 09/19/2020 1338   K 4.7 09/19/2020 1338   CL 98 09/19/2020 1338   CO2 25 09/19/2020 1338   BUN 13 09/19/2020 1338   CREATININE 0.93 09/19/2020 1338   CREATININE 0.85 09/07/2020 1051      Component Value Date/Time   CALCIUM 9.0 09/19/2020 1338   ALKPHOS 91 09/19/2020 1338   AST 18 09/19/2020 1338   AST 13 (L) 09/07/2020 1051   ALT 10 09/19/2020 1338   ALT  9 09/07/2020 1051   BILITOT 0.5 09/19/2020 1338   BILITOT 0.3 09/07/2020 1051       RADIOGRAPHIC STUDIES: DG Chest 2 View  Result Date: 09/19/2020 CLINICAL DATA:  Coughing up blood EXAM: CHEST - 2 VIEW COMPARISON:  Chest radiograph 04/18/2020, CT chest 09/05/2020 FINDINGS: A right chest wall port is in place with the tip terminating in the region of the superior cavoatrial junction. The cardiomediastinal silhouette is grossly stable. There is a moderate to large left pleural effusion layering along the lateral chest wall, increased in size since 04/18/2020. There are patchy opacities throughout the aerated portion of the left lung a portion of which corresponds to the mass lesion seen on the prior CT. There is patchy opacity projecting over the right upper not seen on the prior study corresponding to the right apical lesion seen on the prior CT. There is no right effusion. There is no pneumothorax. The bones are stable. IMPRESSION: 1. Moderate-to-large left pleural effusion layering along the chest wall is increased in size since the prior  radiograph, likely not significantly changed compared to the prior CT. 2. Patchy opacities throughout the aerated left lung correspond to the known mass lesion seen on the prior CT. Superimposed infection cannot be excluded. 3. Opacity in the right upper lung corresponds to the right apical lesion seen on the prior CT. Electronically Signed   By: Valetta Mole M.D.   On: 09/19/2020 13:02   CT Chest W Contrast  Result Date: 09/06/2020 CLINICAL DATA:  Primary Cancer Type: Lung Imaging Indication: Assess response to therapy Interval therapy since last imaging? Yes Initial Cancer Diagnosis Date: 04/18/2020; Established by: Biopsy-proven Detailed Pathology: Stage IV non-small cell lung cancer, squamous cell carcinoma. Primary Tumor location:  Bilateral upper lobe pulmonary masses. Surgeries: Aortoiliac stent graft 2017. Appendectomy. Cholecystectomy. Cardiac stent. Chemotherapy: Yes; Ongoing? No; Most recent administration: 07/06/2020 Immunotherapy?  Yes; Type: Keytruda; Ongoing? Yes Radiation therapy? Yes; Date Range: 06/06/2020 - 06/24/2020; Target: Chest EXAM: CT CHEST, ABDOMEN AND PELVIS WITH CONTRAST TECHNIQUE: Multidetector CT imaging of the chest was performed during intravenous contrast administration. CONTRAST:  11mL OMNIPAQUE IOHEXOL 350 MG/ML SOLN, additional oral enteric contrast COMPARISON:  Most recent CT chest, abdomen and pelvis 07/04/2020. 04/20/2020 PET-CT. FINDINGS: CT CHEST FINDINGS Cardiovascular: Right chest port catheter. Aortic atherosclerosis. Normal heart size. Three-vessel coronary artery calcifications no pericardial effusion. Mediastinum/Nodes: No enlarged mediastinal, hilar, or axillary lymph nodes. Thyroid gland, trachea, and esophagus demonstrate no significant findings. Lungs/Pleura: Moderate, loculated appearing left pleural effusion is increased compared to prior examination, with associated pleural thickening, particularly dependently (series 2, image 48). Unchanged spiculated masses  of the central left upper lobe measuring 5.5 x 3.0 cm (series 4, image 51) and of the posterior right upper lobe measuring 3.1 x 2.4 cm (series 4, image 44). Mild, predominantly paraseptal emphysema. Musculoskeletal: No chest wall mass or suspicious bone lesions identified. CT ABDOMEN PELVIS FINDINGS Hepatobiliary: No focal liver abnormality is seen. Status post cholecystectomy. No biliary dilatation. Pancreas: Unremarkable. No pancreatic ductal dilatation or surrounding inflammatory changes. Spleen: Normal in size without significant abnormality. Adrenals/Urinary Tract: Adrenal glands are unremarkable. Kidneys are normal, without renal calculi, solid lesion, or hydronephrosis. Bladder is unremarkable. Stomach/Bowel: Stomach is within normal limits. Appendix is not clearly visualized. Large burden of stool throughout the colon and rectum. Descending and sigmoid diverticulosis. No evidence of bowel wall thickening, distention, or inflammatory changes. Vascular/Lymphatic: Aortic atherosclerosis. Status post aortobiiliac stent endograft repair of an abdominal aortic aneurysm. No evidence of endoleak or  other complication on this single phase examination. No enlarged abdominal or pelvic lymph nodes. Reproductive: No mass or other abnormality. Other: No abdominal wall hernia or abnormality. No abdominopelvic ascites. Musculoskeletal: No acute or significant osseous findings. IMPRESSION: 1. Unchanged post treatment appearance of spiculated masses of the central left upper lobe and of the posterior right upper lobe, findings are consistent with primary lung malignancy. 2. Moderate, loculated appearing left pleural effusion is increased compared to prior examination, with associated pleural thickening, particularly dependently. Findings are are concerning for pleural metastatic disease. 3. No evidence of metastatic disease in the abdomen or pelvis. 4. Emphysema. 5. Coronary artery disease. 6. Status post aortobiiliac stent  endograft repair of an abdominal aortic aneurysm. No evidence of endoleak or other complication on this non tailored, single phase examination. Aortic Atherosclerosis (ICD10-I70.0) and Emphysema (ICD10-J43.9). Electronically Signed   By: Eddie Candle M.D.   On: 09/06/2020 11:46   CT Angio Chest PE W and/or Wo Contrast  Result Date: 09/19/2020 CLINICAL DATA:  Hemoptysis for 2 days, cough, stage IV squamous cell carcinoma bilateral upper lobes EXAM: CT ANGIOGRAPHY CHEST WITH CONTRAST TECHNIQUE: Multidetector CT imaging of the chest was performed using the standard protocol during bolus administration of intravenous contrast. Multiplanar CT image reconstructions and MIPs were obtained to evaluate the vascular anatomy. CONTRAST:  89mL OMNIPAQUE IOHEXOL 350 MG/ML SOLN COMPARISON:  09/05/2020 FINDINGS: Cardiovascular: This is a technically adequate evaluation of the pulmonary vasculature. No filling defects or pulmonary emboli. The heart is unremarkable without pericardial effusion. No evidence of thoracic aortic aneurysm or dissection. Moderate atherosclerosis of the aorta and coronary vasculature unchanged. Mediastinum/Nodes: No enlarged mediastinal, hilar, or axillary lymph nodes. Thyroid gland, trachea, and esophagus demonstrate no significant findings. Lungs/Pleura: Large loculated left pleural effusion is again noted and unchanged. Bilateral upper lobe spiculated masses are again identified. Masses are as follows: Left upper lobe, 5.8 x 2.7 cm, image 31/7, previously 5.5 x 3.0 cm. Right upper lobe, 3.5 x 2.6 cm, image 25/7, previously 3.1 x 2.4 cm. Stable emphysema. Scattered areas of compressive atelectasis are seen throughout the left lung, stable. No acute airspace disease or pneumothorax. Hypoventilatory changes are seen dependently within the right lower lobe. Central airways are patent. Upper Abdomen: No acute abnormality. Musculoskeletal: No acute or destructive bony lesions. Reconstructed images  demonstrate no additional findings. Review of the MIP images confirms the above findings. IMPRESSION: 1. No evidence of pulmonary embolus. 2. Bilateral upper lobe spiculated masses consistent with known lung cancer. Minimal increase in size since prior study. 3. Stable large loculated left pleural effusion. 4. Scattered areas of atelectasis and hypoventilatory change bilaterally. No acute airspace disease. 5. Aortic Atherosclerosis (ICD10-I70.0) and Emphysema (ICD10-J43.9). Electronically Signed   By: Randa Ngo M.D.   On: 09/19/2020 15:08   CT Abdomen Pelvis W Contrast  Result Date: 09/06/2020 CLINICAL DATA:  Primary Cancer Type: Lung Imaging Indication: Assess response to therapy Interval therapy since last imaging? Yes Initial Cancer Diagnosis Date: 04/18/2020; Established by: Biopsy-proven Detailed Pathology: Stage IV non-small cell lung cancer, squamous cell carcinoma. Primary Tumor location:  Bilateral upper lobe pulmonary masses. Surgeries: Aortoiliac stent graft 2017. Appendectomy. Cholecystectomy. Cardiac stent. Chemotherapy: Yes; Ongoing? No; Most recent administration: 07/06/2020 Immunotherapy?  Yes; Type: Keytruda; Ongoing? Yes Radiation therapy? Yes; Date Range: 06/06/2020 - 06/24/2020; Target: Chest EXAM: CT CHEST, ABDOMEN AND PELVIS WITH CONTRAST TECHNIQUE: Multidetector CT imaging of the chest was performed during intravenous contrast administration. CONTRAST:  56mL OMNIPAQUE IOHEXOL 350 MG/ML SOLN, additional oral enteric contrast  COMPARISON:  Most recent CT chest, abdomen and pelvis 07/04/2020. 04/20/2020 PET-CT. FINDINGS: CT CHEST FINDINGS Cardiovascular: Right chest port catheter. Aortic atherosclerosis. Normal heart size. Three-vessel coronary artery calcifications no pericardial effusion. Mediastinum/Nodes: No enlarged mediastinal, hilar, or axillary lymph nodes. Thyroid gland, trachea, and esophagus demonstrate no significant findings. Lungs/Pleura: Moderate, loculated appearing left  pleural effusion is increased compared to prior examination, with associated pleural thickening, particularly dependently (series 2, image 48). Unchanged spiculated masses of the central left upper lobe measuring 5.5 x 3.0 cm (series 4, image 51) and of the posterior right upper lobe measuring 3.1 x 2.4 cm (series 4, image 44). Mild, predominantly paraseptal emphysema. Musculoskeletal: No chest wall mass or suspicious bone lesions identified. CT ABDOMEN PELVIS FINDINGS Hepatobiliary: No focal liver abnormality is seen. Status post cholecystectomy. No biliary dilatation. Pancreas: Unremarkable. No pancreatic ductal dilatation or surrounding inflammatory changes. Spleen: Normal in size without significant abnormality. Adrenals/Urinary Tract: Adrenal glands are unremarkable. Kidneys are normal, without renal calculi, solid lesion, or hydronephrosis. Bladder is unremarkable. Stomach/Bowel: Stomach is within normal limits. Appendix is not clearly visualized. Large burden of stool throughout the colon and rectum. Descending and sigmoid diverticulosis. No evidence of bowel wall thickening, distention, or inflammatory changes. Vascular/Lymphatic: Aortic atherosclerosis. Status post aortobiiliac stent endograft repair of an abdominal aortic aneurysm. No evidence of endoleak or other complication on this single phase examination. No enlarged abdominal or pelvic lymph nodes. Reproductive: No mass or other abnormality. Other: No abdominal wall hernia or abnormality. No abdominopelvic ascites. Musculoskeletal: No acute or significant osseous findings. IMPRESSION: 1. Unchanged post treatment appearance of spiculated masses of the central left upper lobe and of the posterior right upper lobe, findings are consistent with primary lung malignancy. 2. Moderate, loculated appearing left pleural effusion is increased compared to prior examination, with associated pleural thickening, particularly dependently. Findings are are concerning  for pleural metastatic disease. 3. No evidence of metastatic disease in the abdomen or pelvis. 4. Emphysema. 5. Coronary artery disease. 6. Status post aortobiiliac stent endograft repair of an abdominal aortic aneurysm. No evidence of endoleak or other complication on this non tailored, single phase examination. Aortic Atherosclerosis (ICD10-I70.0) and Emphysema (ICD10-J43.9). Electronically Signed   By: Eddie Candle M.D.   On: 09/06/2020 11:46    ASSESSMENT AND PLAN: This is a very pleasant 75 years old white male recently diagnosed with a stage IV (T2b, N2, M1 C) non-small cell lung cancer, squamous cell carcinoma presented with right upper lobe lung mass, left upper lobe lung mass as well as loculated left pleural effusion and right hilar and paramediastinal lymphadenopathy in addition to bone lesions C2 and L5, right ribs and right iliac crest diagnosed in March 2022 The patient is currently undergoing systemic chemotherapy with carboplatin for AUC of 5, paclitaxel 175 mg/M2 and Keytruda 200 mg IV every 3 weeks with Neulasta support status post 7 cycles.  Starting from cycle #5 the patient is on maintenance treatment with single agent Keytruda 200 Mg IV every 3 weeks. The patient continues to tolerate his treatment with [facility] fairly well. I recommended for him to proceed with cycle #8 today as planned. For the peripheral neuropathy, I will start him on gabapentin 100 mg p.o. twice daily.  We will consider titrating the dose up based on his symptoms. He will come back for follow-up visit in 3 weeks for evaluation before the next cycle of his treatment. He was advised to call immediately if he has any concerning symptoms in the interval. The patient  voices understanding of current disease status and treatment options and is in agreement with the current care plan.  All questions were answered. The patient knows to call the clinic with any problems, questions or concerns. We can certainly see the  patient much sooner if necessary.   Disclaimer: This note was dictated with voice recognition software. Similar sounding words can inadvertently be transcribed and may not be corrected upon review.

## 2020-10-14 ENCOUNTER — Other Ambulatory Visit: Payer: Self-pay | Admitting: Radiation Oncology

## 2020-10-17 ENCOUNTER — Encounter: Payer: Self-pay | Admitting: Internal Medicine

## 2020-10-19 ENCOUNTER — Inpatient Hospital Stay: Payer: Medicare Other

## 2020-10-19 ENCOUNTER — Other Ambulatory Visit: Payer: Self-pay

## 2020-10-19 ENCOUNTER — Inpatient Hospital Stay: Payer: Medicare Other | Attending: Physician Assistant | Admitting: Internal Medicine

## 2020-10-19 VITALS — BP 114/68 | HR 96 | Temp 97.6°F | Resp 18 | Ht 65.0 in | Wt 148.5 lb

## 2020-10-19 DIAGNOSIS — Z5112 Encounter for antineoplastic immunotherapy: Secondary | ICD-10-CM | POA: Insufficient documentation

## 2020-10-19 DIAGNOSIS — Z79899 Other long term (current) drug therapy: Secondary | ICD-10-CM | POA: Insufficient documentation

## 2020-10-19 DIAGNOSIS — Z95828 Presence of other vascular implants and grafts: Secondary | ICD-10-CM

## 2020-10-19 DIAGNOSIS — C3411 Malignant neoplasm of upper lobe, right bronchus or lung: Secondary | ICD-10-CM | POA: Insufficient documentation

## 2020-10-19 DIAGNOSIS — J90 Pleural effusion, not elsewhere classified: Secondary | ICD-10-CM | POA: Insufficient documentation

## 2020-10-19 DIAGNOSIS — C3491 Malignant neoplasm of unspecified part of right bronchus or lung: Secondary | ICD-10-CM

## 2020-10-19 DIAGNOSIS — C3490 Malignant neoplasm of unspecified part of unspecified bronchus or lung: Secondary | ICD-10-CM

## 2020-10-19 DIAGNOSIS — G629 Polyneuropathy, unspecified: Secondary | ICD-10-CM | POA: Insufficient documentation

## 2020-10-19 DIAGNOSIS — R5383 Other fatigue: Secondary | ICD-10-CM

## 2020-10-19 LAB — CMP (CANCER CENTER ONLY)
ALT: 8 U/L (ref 0–44)
AST: 13 U/L — ABNORMAL LOW (ref 15–41)
Albumin: 3.2 g/dL — ABNORMAL LOW (ref 3.5–5.0)
Alkaline Phosphatase: 91 U/L (ref 38–126)
Anion gap: 10 (ref 5–15)
BUN: 15 mg/dL (ref 8–23)
CO2: 21 mmol/L — ABNORMAL LOW (ref 22–32)
Calcium: 9.4 mg/dL (ref 8.9–10.3)
Chloride: 101 mmol/L (ref 98–111)
Creatinine: 1.03 mg/dL (ref 0.61–1.24)
GFR, Estimated: >60 mL/min (ref >60)
Glucose, Bld: 99 mg/dL (ref 70–99)
Potassium: 4.5 mmol/L (ref 3.5–5.1)
Sodium: 132 mmol/L — ABNORMAL LOW (ref 135–145)
Total Bilirubin: 0.5 mg/dL (ref 0.3–1.2)
Total Protein: 7.1 g/dL (ref 6.5–8.1)

## 2020-10-19 LAB — CBC WITH DIFFERENTIAL (CANCER CENTER ONLY)
Abs Immature Granulocytes: 0.03 10*3/uL (ref 0.00–0.07)
Basophils Absolute: 0.1 10*3/uL (ref 0.0–0.1)
Basophils Relative: 1 %
Eosinophils Absolute: 0.5 10*3/uL (ref 0.0–0.5)
Eosinophils Relative: 4 %
HCT: 38.3 % — ABNORMAL LOW (ref 39.0–52.0)
Hemoglobin: 12.7 g/dL — ABNORMAL LOW (ref 13.0–17.0)
Immature Granulocytes: 0 %
Lymphocytes Relative: 4 %
Lymphs Abs: 0.5 10*3/uL — ABNORMAL LOW (ref 0.7–4.0)
MCH: 30.6 pg (ref 26.0–34.0)
MCHC: 33.2 g/dL (ref 30.0–36.0)
MCV: 92.3 fL (ref 80.0–100.0)
Monocytes Absolute: 1 10*3/uL (ref 0.1–1.0)
Monocytes Relative: 9 %
Neutro Abs: 9.4 10*3/uL — ABNORMAL HIGH (ref 1.7–7.7)
Neutrophils Relative %: 82 %
Platelet Count: 257 10*3/uL (ref 150–400)
RBC: 4.15 MIL/uL — ABNORMAL LOW (ref 4.22–5.81)
RDW: 12.8 % (ref 11.5–15.5)
WBC Count: 11.6 10*3/uL — ABNORMAL HIGH (ref 4.0–10.5)
nRBC: 0 % (ref 0.0–0.2)

## 2020-10-19 LAB — TSH: TSH: 2.709 u[IU]/mL (ref 0.320–4.118)

## 2020-10-19 MED ORDER — SODIUM CHLORIDE 0.9% FLUSH
10.0000 mL | Freq: Once | INTRAVENOUS | Status: AC
  Administered 2020-10-19: 10 mL

## 2020-10-19 MED ORDER — HEPARIN SOD (PORK) LOCK FLUSH 100 UNIT/ML IV SOLN
500.0000 [IU] | Freq: Once | INTRAVENOUS | Status: AC | PRN
  Administered 2020-10-19: 500 [IU]

## 2020-10-19 MED ORDER — SODIUM CHLORIDE 0.9% FLUSH
10.0000 mL | INTRAVENOUS | Status: DC | PRN
  Administered 2020-10-19: 10 mL

## 2020-10-19 MED ORDER — SODIUM CHLORIDE 0.9 % IV SOLN: Freq: Once | INTRAVENOUS | Status: AC

## 2020-10-19 MED ORDER — PEMBROLIZUMAB CHEMO INJECTION 100 MG/4ML
200.0000 mg | Freq: Once | INTRAVENOUS | Status: AC
Start: 2020-10-19 — End: 2020-10-19
  Administered 2020-10-19: 200 mg via INTRAVENOUS
  Filled 2020-10-19: qty 8

## 2020-10-19 NOTE — Progress Notes (Signed)
[facility] [phone]   [phone]  OFFICE PROGRESS NOTE  [name], MD [address] [place] [address]  DIAGNOSIS: Stage IV non-small cell lung cancer, squamous cell carcinoma.  He presented with a right upper lobe lung mass, left upper lobe lung mass, and a loculated left pleural effusion.  He had mild hypermetabolism in the right hilar and paramediastinal lymphadenopathy.  He had a few small bone lesions on C2, L5, right ribs, and right iliac crest.  He was diagnosed in March 2022   PD-L1: 0%.   PRIOR THERAPY: None   CURRENT THERAPY: Systemic chemotherapy with carboplatin for an AUC of 5, paclitaxel 175 mg/m2, Keytruda 200 mg IV 3 weeks with Neulasta support.  First dose expected on 05/02/2020.  Status post 8 cycles  INTERVAL HISTORY: Riley Sharp 75 y.o. male returns to the clinic today for follow-up visit.  The patient is feeling fine today with no concerning complaints except for mild fatigue and chest congestion.  He also has dry cough.  He denied having any current chest pain, shortness of breath or hemoptysis.  He denied having any fever or chills.  He has no nausea, vomiting, diarrhea or constipation.  He has no headache or visual changes.  He mentioned the peripheral neuropathy is getting better especially in the hands.  He is still on gabapentin.  He is here today for evaluation before starting cycle #9 of his treatment.   MEDICAL HISTORY: Past Medical History:  Diagnosis Date   AAA (abdominal aortic aneurysm) (HCC)    CAD (coronary artery disease)    Cancer ([place])    basal cell carcimona right ear and upper left at shoulder   Glaucoma    Hyperlipidemia    Hypertension    Myocardial infarction ([facility]) 03/2003   Wears glasses     ALLERGIES:  has No Known Allergies.  MEDICATIONS:  Current Outpatient Medications  Medication Sig Dispense Refill   albuterol (PROVENTIL) (2.5 MG/3ML) 0.083% nebulizer solution  Take 3 mLs (2.5 mg total) by nebulization every 6 (six) hours as needed for wheezing or shortness of breath. 75 mL 12   albuterol (VENTOLIN HFA) 108 (90 Base) MCG/ACT inhaler Inhale 2 puffs into the lungs every 6 (six) hours as needed for wheezing or shortness of breath. 8 g 6   aspirin EC 81 MG tablet Take 81 mg by mouth daily.     brimonidine (ALPHAGAN) 0.2 % ophthalmic solution Place 1 drop into both eyes 3 (three) times daily.     dorzolamide (TRUSOPT) 2 % ophthalmic solution Place 1 drop into both eyes 3 (three) times daily.     gabapentin (NEURONTIN) 100 MG capsule Take 1 capsule (100 mg total) by mouth 2 (two) times daily. 60 capsule 1   latanoprost (XALATAN) 0.005 % ophthalmic solution Place 1 drop into both eyes at bedtime.     lidocaine-prilocaine (EMLA) cream Apply 1 application topically as needed. 30 g 2   loratadine (CLARITIN) 10 MG tablet Take 10 mg by mouth daily.     metoprolol succinate (TOPROL-XL) 25 MG 24 hr tablet Take 25 mg by mouth daily.     ondansetron (ZOFRAN) 8 MG tablet Take 1 tablet by mouth every 8 (eight) hours as needed.     prochlorperazine (COMPAZINE) 10 MG tablet Take 1 tablet (10 mg total) by mouth every 6 (six) hours as needed. 30 tablet 2   ramipril (ALTACE) 5 MG tablet Take 5  mg by mouth daily.     rosuvastatin (CRESTOR) 10 MG tablet Take 10 mg by mouth daily.     timolol (TIMOPTIC) 0.5 % ophthalmic solution Place 1 drop into both eyes daily.     Tiotropium Bromide-Olodaterol 2.5-2.5 MCG/ACT AERS Inhale 2 puffs into the lungs daily. 2 each 0   No current facility-administered medications for this visit.    SURGICAL HISTORY:  Past Surgical History:  Procedure Laterality Date   ABDOMINAL AORTIC ENDOVASCULAR STENT GRAFT N/A 11/14/2015   Procedure: ABDOMINAL AORTIC ENDOVASCULAR STENT GRAFT;  Surgeon: Sherren Kerns, MD;  [facility];  Service: Vascular;  Laterality: N/A;   APPENDECTOMY     BRONCHIAL BIOPSY  04/18/2020   Procedure: BRONCHIAL BIOPSIES;   Surgeon: Josephine Igo, DO;  Location: [facility] ENDOSCOPY;  Service: Pulmonary;;   BRONCHIAL BRUSHINGS  04/18/2020   Procedure: BRONCHIAL BRUSHINGS;  Surgeon: Josephine Igo, DO;  Location: [facility] ENDOSCOPY;  Service: Pulmonary;;   BRONCHIAL NEEDLE ASPIRATION BIOPSY  04/18/2020   Procedure: BRONCHIAL NEEDLE ASPIRATION BIOPSIES;  Surgeon: Josephine Igo, DO;  Location: [facility] ENDOSCOPY;  Service: Pulmonary;;   BRONCHIAL WASHINGS  04/18/2020   Procedure: BRONCHIAL WASHINGS;  Surgeon: Josephine Igo, DO;  Location: [facility] ENDOSCOPY;  Service: Pulmonary;;   CHOLECYSTECTOMY  2007   Gall Bladder   COLONOSCOPY W/ BIOPSIES AND POLYPECTOMY     EYE SURGERY Bilateral    laser for glaucoma both eyesn x 2   heart stent  Feb. 14, 2005   IR IMAGING GUIDED PORT INSERTION  05/03/2020   THORACENTESIS Left 04/18/2020   Procedure: THORACENTESIS;  Surgeon: Josephine Igo, DO;  Location: [facility] ENDOSCOPY;  Service: Pulmonary;  Laterality: Left;   VIDEO BRONCHOSCOPY WITH ENDOBRONCHIAL NAVIGATION Bilateral 04/18/2020   Procedure: VIDEO BRONCHOSCOPY WITH ENDOBRONCHIAL NAVIGATION;  Surgeon: Josephine Igo, DO;  Location: [facility] ENDOSCOPY;  Service: Pulmonary;  Laterality: Bilateral;    REVIEW OF SYSTEMS:  A comprehensive review of systems was negative except for: Constitutional: positive for fatigue Respiratory: positive for cough Neurological: positive for paresthesia   PHYSICAL EXAMINATION: General appearance: alert, cooperative, fatigued, and no distress Head: Normocephalic, without obvious abnormality, atraumatic Neck: no adenopathy, no JVD, supple, symmetrical, trachea midline, and thyroid not enlarged, symmetric, no tenderness/mass/nodules Lymph nodes: Cervical, supraclavicular, and axillary nodes normal. Resp: clear to auscultation bilaterally Back: symmetric, no curvature. ROM normal. No CVA tenderness. Cardio: regular rate and rhythm, S1, S2 normal, no murmur, click, rub or gallop GI: soft, non-tender; bowel sounds  normal; no masses,  no organomegaly Extremities: extremities normal, atraumatic, no cyanosis or edema  ECOG PERFORMANCE STATUS: 1 - Symptomatic but completely ambulatory  Blood pressure 114/68, pulse 96, temperature 97.6 F (36.4 C), temperature source Tympanic, resp. rate 18, height 5\' 5"  (1.651 m), weight 148 lb 8 oz (67.4 kg), SpO2 100 %.  LABORATORY DATA: Lab Results  Component Value Date   WBC 11.6 (H) 10/19/2020   HGB 12.7 (L) 10/19/2020   HCT 38.3 (L) 10/19/2020   MCV 92.3 10/19/2020   PLT 257 10/19/2020      Chemistry      Component Value Date/Time   NA 137 09/28/2020 1024   K 4.0 09/28/2020 1024   CL 104 09/28/2020 1024   CO2 24 09/28/2020 1024   BUN 17 09/28/2020 1024   CREATININE 1.06 09/28/2020 1024      Component Value Date/Time   CALCIUM 9.1 09/28/2020 1024   ALKPHOS 89 09/28/2020 1024   AST 13 (L) 09/28/2020 1024  ALT 12 09/28/2020 1024   BILITOT 0.4 09/28/2020 1024       RADIOGRAPHIC STUDIES: DG Chest 2 View  Result Date: 09/19/2020 CLINICAL DATA:  Coughing up blood EXAM: CHEST - 2 VIEW COMPARISON:  Chest radiograph 04/18/2020, CT chest 09/05/2020 FINDINGS: A right chest wall port is in place with the tip terminating in the region of the superior cavoatrial junction. The cardiomediastinal silhouette is grossly stable. There is a moderate to large left pleural effusion layering along the lateral chest wall, increased in size since 04/18/2020. There are patchy opacities throughout the aerated portion of the left lung a portion of which corresponds to the mass lesion seen on the prior CT. There is patchy opacity projecting over the right upper not seen on the prior study corresponding to the right apical lesion seen on the prior CT. There is no right effusion. There is no pneumothorax. The bones are stable. IMPRESSION: 1. Moderate-to-large left pleural effusion layering along the chest wall is increased in size since the prior radiograph, likely not  significantly changed compared to the prior CT. 2. Patchy opacities throughout the aerated left lung correspond to the known mass lesion seen on the prior CT. Superimposed infection cannot be excluded. 3. Opacity in the right upper lung corresponds to the right apical lesion seen on the prior CT. Electronically Signed   By: Valetta Mole M.D.   On: 09/19/2020 13:02   CT Angio Chest PE W and/or Wo Contrast  Result Date: 09/19/2020 CLINICAL DATA:  Hemoptysis for 2 days, cough, stage IV squamous cell carcinoma bilateral upper lobes EXAM: CT ANGIOGRAPHY CHEST WITH CONTRAST TECHNIQUE: Multidetector CT imaging of the chest was performed using the standard protocol during bolus administration of intravenous contrast. Multiplanar CT image reconstructions and MIPs were obtained to evaluate the vascular anatomy. CONTRAST:  60mL OMNIPAQUE IOHEXOL 350 MG/ML SOLN COMPARISON:  09/05/2020 FINDINGS: Cardiovascular: This is a technically adequate evaluation of the pulmonary vasculature. No filling defects or pulmonary emboli. The heart is unremarkable without pericardial effusion. No evidence of thoracic aortic aneurysm or dissection. Moderate atherosclerosis of the aorta and coronary vasculature unchanged. Mediastinum/Nodes: No enlarged mediastinal, hilar, or axillary lymph nodes. Thyroid gland, trachea, and esophagus demonstrate no significant findings. Lungs/Pleura: Large loculated left pleural effusion is again noted and unchanged. Bilateral upper lobe spiculated masses are again identified. Masses are as follows: Left upper lobe, 5.8 x 2.7 cm, image 31/7, previously 5.5 x 3.0 cm. Right upper lobe, 3.5 x 2.6 cm, image 25/7, previously 3.1 x 2.4 cm. Stable emphysema. Scattered areas of compressive atelectasis are seen throughout the left lung, stable. No acute airspace disease or pneumothorax. Hypoventilatory changes are seen dependently within the right lower lobe. Central airways are patent. Upper Abdomen: No acute  abnormality. Musculoskeletal: No acute or destructive bony lesions. Reconstructed images demonstrate no additional findings. Review of the MIP images confirms the above findings. IMPRESSION: 1. No evidence of pulmonary embolus. 2. Bilateral upper lobe spiculated masses consistent with known lung cancer. Minimal increase in size since prior study. 3. Stable large loculated left pleural effusion. 4. Scattered areas of atelectasis and hypoventilatory change bilaterally. No acute airspace disease. 5. Aortic Atherosclerosis (ICD10-I70.0) and Emphysema (ICD10-J43.9). Electronically Signed   By: Randa Ngo M.D.   On: 09/19/2020 15:08    ASSESSMENT AND PLAN: This is a very pleasant 75 years old white male recently diagnosed with a stage IV (T2b, N2, M1 C) non-small cell lung cancer, squamous cell carcinoma presented with right upper lobe lung mass,  left upper lobe lung mass as well as loculated left pleural effusion and right hilar and paramediastinal lymphadenopathy in addition to bone lesions C2 and L5, right ribs and right iliac crest diagnosed in March 2022 The patient is currently undergoing systemic chemotherapy with carboplatin for AUC of 5, paclitaxel 175 mg/M2 and Keytruda 200 mg IV every 3 weeks with Neulasta support status post 8 cycles.  Starting from cycle #5 the patient is on maintenance treatment with single agent Keytruda 200 Mg IV every 3 weeks. The patient continues to tolerate this treatment well with no concerning adverse effect except for mild fatigue. I recommended for him to proceed with cycle #9 today as planned. I will see him back for follow-up visit in 3 weeks for evaluation with repeat CT scan of the chest, abdomen and pelvis for restaging of his disease. For the peripheral neuropathy, I will start him on gabapentin 100 mg p.o. twice daily.   He was advised to call immediately if he has any concerning symptoms in the interval. The patient voices understanding of current disease  status and treatment options and is in agreement with the current care plan.  All questions were answered. The patient knows to call the clinic with any problems, questions or concerns. We can certainly see the patient much sooner if necessary.   Disclaimer: This note was dictated with voice recognition software. Similar sounding words can inadvertently be transcribed and may not be corrected upon review.

## 2020-10-19 NOTE — Patient Instructions (Signed)
[facility]   Discharge Instructions: Thank you for choosing [facility] to provide your oncology and hematology care.   If you have a lab appointment with the [facility], please go directly to the [facility] and check in at the registration area.   Wear comfortable clothing and clothing appropriate for easy access to any Portacath or PICC line.   We strive to give you quality time with your provider. You may need to reschedule your appointment if you arrive late (15 or more minutes).  Arriving late affects you and other patients whose appointments are after yours.  Also, if you miss three or more appointments without notifying the office, you may be dismissed from the clinic at the provider's discretion.      For prescription refill requests, have your pharmacy contact our office and allow 72 hours for refills to be completed.    Today you received the following chemotherapy and/or immunotherapy agents: Pembrolizumab (Keytruda).      To help prevent nausea and vomiting after your treatment, we encourage you to take your nausea medication as directed.  BELOW ARE SYMPTOMS THAT SHOULD BE REPORTED IMMEDIATELY: *FEVER GREATER THAN 100.4 F (38 C) OR HIGHER *CHILLS OR SWEATING *NAUSEA AND VOMITING THAT IS NOT CONTROLLED WITH YOUR NAUSEA MEDICATION *UNUSUAL SHORTNESS OF BREATH *UNUSUAL BRUISING OR BLEEDING *URINARY PROBLEMS (pain or burning when urinating, or frequent urination) *BOWEL PROBLEMS (unusual diarrhea, constipation, pain near the anus) TENDERNESS IN MOUTH AND THROAT WITH OR WITHOUT PRESENCE OF ULCERS (sore throat, sores in mouth, or a toothache) UNUSUAL RASH, SWELLING OR PAIN  UNUSUAL VAGINAL DISCHARGE OR ITCHING   Items with * indicate a potential emergency and should be followed up as soon as possible or go to the Emergency Department if any problems should occur.  Please show the CHEMOTHERAPY ALERT CARD or IMMUNOTHERAPY ALERT  CARD at check-in to the Emergency Department and triage nurse.  Should you have questions after your visit or need to cancel or reschedule your appointment, please contact [facility]  Dept: [phone]  and follow the prompts.  Office hours are 8:00 a.m. to 4:30 p.m. Monday - Friday. Please note that voicemails left after 4:00 p.m. may not be returned until the following business day.  We are closed weekends and major holidays. You have access to a nurse at all times for urgent questions. Please call the main number to the clinic Dept: [phone] and follow the prompts.   For any non-urgent questions, you may also contact your provider using MyChart. We now offer e-Visits for anyone 41 and older to request care online for non-urgent symptoms. For details visit [email or web address].   Also download the MyChart app! Go to the app store, search "MyChart", open the app, select [facility], and log in with your MyChart username and password.  Due to Covid, a mask is required upon entering the hospital/clinic. If you do not have a mask, one will be given to you upon arrival. For doctor visits, patients may have 1 support person aged 40 or older with them. For treatment visits, patients cannot have anyone with them due to current Covid guidelines and our immunocompromised population.

## 2020-11-02 NOTE — Progress Notes (Deleted)
[place] OFFICE PROGRESS NOTE  Riley Small, MD [place] [address] [place] [address]  DIAGNOSIS:  Stage IV non-Sharp cell lung cancer, squamous cell carcinoma.  He presented with a right upper lobe lung mass, left upper lobe lung mass, and a loculated left pleural effusion.  He had mild hypermetabolism in the right hilar and paramediastinal lymphadenopathy.  He had a few Sharp bone lesions on C2, L5, right ribs, and right iliac crest.  He was diagnosed in March 2022   PD-L1: 0%  PRIOR THERAPY:  Palliative radiotherapy to the lung for the hemoptysis under the care of Dr. Lisbeth Renshaw.  Last treatment on 06/24/2020  CURRENT THERAPY: Systemic chemotherapy with carboplatin for an AUC of 5, paclitaxel 175 mg/m2, Keytruda 200 mg IV 3 weeks with Neulasta support.  First dose expected on 05/02/2020. Status post 6 cycles. Starting from cycle #5 he will be on single agent maintenance Keytruda.    INTERVAL HISTORY: Riley Sharp 75 y.o. male returns to the clinic today for follow-up visit.  The patient is feeling fairly well today without any concerning complaints except for peripheral neuropathy which he is currently taking ***.  He denies any recent fever, chills, or night sweats.  Weight loss?  He denies any chest pain.  He denies any hemoptysis since he went to the emergency room on 09/19/2020.  He reports dyspnea on exertion.  Cough?  He denies any nausea, vomiting, diarrhea, or constipation.  He denies any headache or visual changes.  He denies any rashes or skin changes.  He is here today for evaluation and repeat blood work before starting cycle #10.    MEDICAL HISTORY: Past Medical History:  Diagnosis Date   AAA (abdominal aortic aneurysm) (HCC)    CAD (coronary artery disease)    Cancer ([place])    basal cell carcimona right ear and upper left at shoulder   Glaucoma    Hyperlipidemia    Hypertension    Myocardial infarction ([place]) 03/2003   Wears glasses      ALLERGIES:  has No Known Allergies.  MEDICATIONS:  Current Outpatient Medications  Medication Sig Dispense Refill   albuterol (PROVENTIL) (2.5 MG/3ML) 0.083% nebulizer solution Take 3 mLs (2.5 mg total) by nebulization every 6 (six) hours as needed for wheezing or shortness of breath. 75 mL 12   albuterol (VENTOLIN HFA) 108 (90 Base) MCG/ACT inhaler Inhale 2 puffs into the lungs every 6 (six) hours as needed for wheezing or shortness of breath. 8 g 6   aspirin EC 81 MG tablet Take 81 mg by mouth daily.     brimonidine (ALPHAGAN) 0.2 % ophthalmic solution Place 1 drop into both eyes 3 (three) times daily.     dorzolamide (TRUSOPT) 2 % ophthalmic solution Place 1 drop into both eyes 3 (three) times daily.     gabapentin (NEURONTIN) 100 MG capsule Take 1 capsule (100 mg total) by mouth 2 (two) times daily. 60 capsule 1   latanoprost (XALATAN) 0.005 % ophthalmic solution Place 1 drop into both eyes at bedtime.     lidocaine-prilocaine (EMLA) cream Apply 1 application topically as needed. 30 g 2   loratadine (CLARITIN) 10 MG tablet Take 10 mg by mouth daily.     metoprolol succinate (TOPROL-XL) 25 MG 24 hr tablet Take 25 mg by mouth daily.     ondansetron (ZOFRAN) 8 MG tablet Take 1 tablet by mouth every 8 (eight) hours as needed.     prochlorperazine (COMPAZINE)  10 MG tablet Take 1 tablet (10 mg total) by mouth every 6 (six) hours as needed. 30 tablet 2   ramipril (ALTACE) 5 MG tablet Take 5 mg by mouth daily.     rosuvastatin (CRESTOR) 10 MG tablet Take 10 mg by mouth daily.     timolol (TIMOPTIC) 0.5 % ophthalmic solution Place 1 drop into both eyes daily.     Tiotropium Bromide-Olodaterol 2.5-2.5 MCG/ACT AERS Inhale 2 puffs into the lungs daily. 2 each 0   No current facility-administered medications for this visit.    SURGICAL HISTORY:  Past Surgical History:  Procedure Laterality Date   ABDOMINAL AORTIC ENDOVASCULAR STENT GRAFT N/A 11/14/2015   Procedure: ABDOMINAL AORTIC  ENDOVASCULAR STENT GRAFT;  Surgeon: Elam Dutch, MD;  Location: [facility];  Service: Vascular;  Laterality: N/A;   APPENDECTOMY     BRONCHIAL BIOPSY  04/18/2020   Procedure: BRONCHIAL BIOPSIES;  Surgeon: [name], DO;  Location: [facility];  Service: Pulmonary;;   BRONCHIAL BRUSHINGS  04/18/2020   Procedure: BRONCHIAL BRUSHINGS;  Surgeon: [name], DO;  Location: [facility];  Service: Pulmonary;;   BRONCHIAL NEEDLE ASPIRATION BIOPSY  04/18/2020   Procedure: BRONCHIAL NEEDLE ASPIRATION BIOPSIES;  Surgeon: [name], DO;  Location: [facility];  Service: Pulmonary;;   BRONCHIAL WASHINGS  04/18/2020   Procedure: BRONCHIAL WASHINGS;  Surgeon: [name], DO;  Location: [facility];  Service: Pulmonary;;   CHOLECYSTECTOMY  2007   Gall Bladder   COLONOSCOPY W/ BIOPSIES AND POLYPECTOMY     EYE SURGERY Bilateral    laser for glaucoma both eyesn x 2   heart stent  Feb. 14, 2005   IR IMAGING GUIDED PORT INSERTION  05/03/2020   THORACENTESIS Left 04/18/2020   Procedure: THORACENTESIS;  Surgeon: [name], DO;  Location: [facility];  Service: Pulmonary;  Laterality: Left;   VIDEO BRONCHOSCOPY WITH ENDOBRONCHIAL NAVIGATION Bilateral 04/18/2020   Procedure: VIDEO BRONCHOSCOPY WITH ENDOBRONCHIAL NAVIGATION;  Surgeon: [name], DO;  Location: [facility];  Service: Pulmonary;  Laterality: Bilateral;    REVIEW OF SYSTEMS:   Review of Systems  Constitutional: Negative for appetite change, chills, fatigue, fever and unexpected weight change.  HENT:   Negative for mouth sores, nosebleeds, sore throat and trouble swallowing.   Eyes: Negative for eye problems and icterus.  Respiratory: Negative for cough, hemoptysis, shortness of breath and wheezing.   Cardiovascular: Negative for chest pain and leg swelling.  Gastrointestinal: Negative for abdominal pain, constipation, diarrhea, nausea and vomiting.  Genitourinary: Negative for bladder incontinence, difficulty  urinating, dysuria, frequency and hematuria.   Musculoskeletal: Negative for back pain, gait problem, neck pain and neck stiffness.  Skin: Negative for itching and rash.  Neurological: Negative for dizziness, extremity weakness, gait problem, headaches, light-headedness and seizures.  Hematological: Negative for adenopathy. Does not bruise/bleed easily.  Psychiatric/Behavioral: Negative for confusion, depression and sleep disturbance. The patient is not nervous/anxious.     PHYSICAL EXAMINATION:  There were no vitals taken for this visit.  ECOG PERFORMANCE STATUS: {CHL ONC ECOG [id number]  Physical Exam  Constitutional: Oriented to person, place, and time and well-developed, well-nourished, and in no distress. No distress.  HENT:  Head: Normocephalic and atraumatic.  Mouth/Throat: Oropharynx is clear and moist. No oropharyngeal exudate.  Eyes: Conjunctivae are normal. Right eye exhibits no discharge. Left eye exhibits no discharge. No scleral icterus.  Neck: Normal range of motion. Neck supple.  Cardiovascular: Normal rate, regular rhythm, normal heart sounds and intact distal  pulses.   Pulmonary/Chest: Effort normal and breath sounds normal. No respiratory distress. No wheezes. No rales.  Abdominal: Soft. Bowel sounds are normal. Exhibits no distension and no mass. There is no tenderness.  Musculoskeletal: Normal range of motion. Exhibits no edema.  Lymphadenopathy:    No cervical adenopathy.  Neurological: Alert and oriented to person, place, and time. Exhibits normal muscle tone. Gait normal. Coordination normal.  Skin: Skin is warm and dry. No rash noted. Not diaphoretic. No erythema. No pallor.  Psychiatric: Mood, memory and judgment normal.  Vitals reviewed.  LABORATORY DATA: Lab Results  Component Value Date   WBC 11.6 (H) 10/19/2020   HGB 12.7 (L) 10/19/2020   HCT 38.3 (L) 10/19/2020   MCV 92.3 10/19/2020   PLT 257 10/19/2020      Chemistry      Component Value  Date/Time   NA 132 (L) 10/19/2020 0905   K 4.5 10/19/2020 0905   CL 101 10/19/2020 0905   CO2 21 (L) 10/19/2020 0905   BUN 15 10/19/2020 0905   CREATININE 1.03 10/19/2020 0905      Component Value Date/Time   CALCIUM 9.4 10/19/2020 0905   ALKPHOS 91 10/19/2020 0905   AST 13 (L) 10/19/2020 0905   ALT 8 10/19/2020 0905   BILITOT 0.5 10/19/2020 0905       RADIOGRAPHIC STUDIES:  No results found.   ASSESSMENT/PLAN:  This is a very pleasant 75 year old Caucasian male diagnosed with stage IV non-Sharp cell lung cancer, squamous cell carcinoma.  The patient presented with a right upper lobe lung mass, left upper lobe lung mass, loculated left pleural effusion, mild hypermetabolism in the right hilar anterior mediastinal lymph nodes, and a few Sharp metastatic bone lesions on C2, L5, right ribs, and right iliac crest.  He was diagnosed in March 2022.  His PD-L1 expression is 0%.    The patient is currently undergoing systemic chemotherapy with carboplatin for AUC of 5, paclitaxel 175 mg/M2 and Keytruda 200 mg IV every 3 weeks with Neulasta support status post 9 cycles of treatment.  Starting from cycle #5, the patient has been on maintenance single agent immunotherapy with Keytruda.   He completed palliative radiotherapy for the hemoptysis under the care of Dr. Lisbeth Renshaw.  This was completed on 06/24/2020.  Labs reviewed.  Recommend that he ***with cycle #10 today scheduled.  I will arrange for restaging CT scan of the chest, abdomen, and pelvis prior to starting his neck cycle of treatment.   We will see him back for follow-up visit in 3 weeks for evaluation and to review his scan results before starting cycle #11.  Gabapentin? Ask if prescribed?  The patient was advised to call immediately if he has any concerning symptoms in the interval. The patient voices understanding of current disease status and treatment options and is in agreement with the current care plan. All questions were  answered. The patient knows to call the clinic with any problems, questions or concerns. We can certainly see the patient much sooner if necessary     No orders of the defined types were placed in this encounter.    I spent {CHL ONC TIME VISIT - YTKZS:[id number]} counseling the patient face to face. The total time spent in the appointment was {CHL ONC TIME VISIT - DUKGU:[id number]}.  Milinda Sweeney L Joaquin Knebel, PA-C 11/02/20

## 2020-11-08 ENCOUNTER — Other Ambulatory Visit: Payer: Self-pay

## 2020-11-08 ENCOUNTER — Ambulatory Visit (HOSPITAL_COMMUNITY)
Admission: RE | Admit: 2020-11-08 | Discharge: 2020-11-08 | Disposition: A | Discharge status: Home / Self Care | Payer: Medicare Other | Source: Ambulatory Visit | Attending: Internal Medicine | Admitting: Internal Medicine

## 2020-11-08 DIAGNOSIS — J90 Pleural effusion, not elsewhere classified: Secondary | ICD-10-CM | POA: Diagnosis not present

## 2020-11-08 DIAGNOSIS — J439 Emphysema, unspecified: Secondary | ICD-10-CM | POA: Diagnosis not present

## 2020-11-08 DIAGNOSIS — I700 Atherosclerosis of aorta: Secondary | ICD-10-CM | POA: Diagnosis not present

## 2020-11-08 DIAGNOSIS — C3490 Malignant neoplasm of unspecified part of unspecified bronchus or lung: Secondary | ICD-10-CM | POA: Insufficient documentation

## 2020-11-08 DIAGNOSIS — J9811 Atelectasis: Secondary | ICD-10-CM | POA: Diagnosis not present

## 2020-11-08 DIAGNOSIS — C787 Secondary malignant neoplasm of liver and intrahepatic bile duct: Secondary | ICD-10-CM | POA: Diagnosis not present

## 2020-11-08 DIAGNOSIS — R911 Solitary pulmonary nodule: Secondary | ICD-10-CM | POA: Diagnosis not present

## 2020-11-08 DIAGNOSIS — R0602 Shortness of breath: Secondary | ICD-10-CM | POA: Diagnosis not present

## 2020-11-08 DIAGNOSIS — I3139 Other pericardial effusion (noninflammatory): Secondary | ICD-10-CM | POA: Diagnosis not present

## 2020-11-08 MED ORDER — IOHEXOL 350 MG/ML SOLN
80.0000 mL | Freq: Once | INTRAVENOUS | Status: AC | PRN
  Administered 2020-11-08: 80 mL via INTRAVENOUS

## 2020-11-09 ENCOUNTER — Inpatient Hospital Stay: Payer: Medicare Other

## 2020-11-09 ENCOUNTER — Inpatient Hospital Stay: Payer: Medicare Other | Attending: Physician Assistant | Admitting: Internal Medicine

## 2020-11-09 ENCOUNTER — Encounter: Payer: Self-pay | Admitting: Internal Medicine

## 2020-11-09 VITALS — BP 106/57 | HR 98 | Temp 97.0°F | Resp 19 | Ht 65.0 in | Wt 145.6 lb

## 2020-11-09 DIAGNOSIS — Z5189 Encounter for other specified aftercare: Secondary | ICD-10-CM | POA: Insufficient documentation

## 2020-11-09 DIAGNOSIS — C7951 Secondary malignant neoplasm of bone: Secondary | ICD-10-CM | POA: Diagnosis not present

## 2020-11-09 DIAGNOSIS — C787 Secondary malignant neoplasm of liver and intrahepatic bile duct: Secondary | ICD-10-CM | POA: Diagnosis not present

## 2020-11-09 DIAGNOSIS — Z5111 Encounter for antineoplastic chemotherapy: Secondary | ICD-10-CM | POA: Diagnosis not present

## 2020-11-09 DIAGNOSIS — C3491 Malignant neoplasm of unspecified part of right bronchus or lung: Secondary | ICD-10-CM | POA: Diagnosis not present

## 2020-11-09 DIAGNOSIS — Z5112 Encounter for antineoplastic immunotherapy: Secondary | ICD-10-CM | POA: Diagnosis not present

## 2020-11-09 DIAGNOSIS — C3490 Malignant neoplasm of unspecified part of unspecified bronchus or lung: Secondary | ICD-10-CM

## 2020-11-09 DIAGNOSIS — R197 Diarrhea, unspecified: Secondary | ICD-10-CM | POA: Diagnosis not present

## 2020-11-09 DIAGNOSIS — Z452 Encounter for adjustment and management of vascular access device: Secondary | ICD-10-CM | POA: Diagnosis not present

## 2020-11-09 DIAGNOSIS — R112 Nausea with vomiting, unspecified: Secondary | ICD-10-CM | POA: Diagnosis not present

## 2020-11-09 DIAGNOSIS — Z95828 Presence of other vascular implants and grafts: Secondary | ICD-10-CM | POA: Diagnosis not present

## 2020-11-09 DIAGNOSIS — R5383 Other fatigue: Secondary | ICD-10-CM

## 2020-11-09 DIAGNOSIS — G629 Polyneuropathy, unspecified: Secondary | ICD-10-CM | POA: Insufficient documentation

## 2020-11-09 DIAGNOSIS — C3411 Malignant neoplasm of upper lobe, right bronchus or lung: Secondary | ICD-10-CM | POA: Insufficient documentation

## 2020-11-09 DIAGNOSIS — J90 Pleural effusion, not elsewhere classified: Secondary | ICD-10-CM | POA: Diagnosis not present

## 2020-11-09 LAB — CBC WITH DIFFERENTIAL (CANCER CENTER ONLY)
Abs Immature Granulocytes: 0.04 10*3/uL (ref 0.00–0.07)
Basophils Absolute: 0.1 10*3/uL (ref 0.0–0.1)
Basophils Relative: 0 %
Eosinophils Absolute: 0.5 10*3/uL (ref 0.0–0.5)
Eosinophils Relative: 4 %
HCT: 35.9 % — ABNORMAL LOW (ref 39.0–52.0)
Hemoglobin: 11.9 g/dL — ABNORMAL LOW (ref 13.0–17.0)
Immature Granulocytes: 0 %
Lymphocytes Relative: 4 %
Lymphs Abs: 0.5 10*3/uL — ABNORMAL LOW (ref 0.7–4.0)
MCH: 30.3 pg (ref 26.0–34.0)
MCHC: 33.1 g/dL (ref 30.0–36.0)
MCV: 91.3 fL (ref 80.0–100.0)
Monocytes Absolute: 1 10*3/uL (ref 0.1–1.0)
Monocytes Relative: 8 %
Neutro Abs: 10.8 10*3/uL — ABNORMAL HIGH (ref 1.7–7.7)
Neutrophils Relative %: 84 %
Platelet Count: 275 10*3/uL (ref 150–400)
RBC: 3.93 MIL/uL — ABNORMAL LOW (ref 4.22–5.81)
RDW: 12.9 % (ref 11.5–15.5)
WBC Count: 12.9 10*3/uL — ABNORMAL HIGH (ref 4.0–10.5)
nRBC: 0 % (ref 0.0–0.2)

## 2020-11-09 LAB — CMP (CANCER CENTER ONLY)
ALT: 12 U/L (ref 0–44)
AST: 14 U/L — ABNORMAL LOW (ref 15–41)
Albumin: 3.2 g/dL — ABNORMAL LOW (ref 3.5–5.0)
Alkaline Phosphatase: 75 U/L (ref 38–126)
Anion gap: 6 (ref 5–15)
BUN: 14 mg/dL (ref 8–23)
CO2: 26 mmol/L (ref 22–32)
Calcium: 9.4 mg/dL (ref 8.9–10.3)
Chloride: 104 mmol/L (ref 98–111)
Creatinine: 0.96 mg/dL (ref 0.61–1.24)
GFR, Estimated: >60 mL/min (ref >60)
Glucose, Bld: 112 mg/dL — ABNORMAL HIGH (ref 70–99)
Potassium: 4.4 mmol/L (ref 3.5–5.1)
Sodium: 136 mmol/L (ref 135–145)
Total Bilirubin: 0.5 mg/dL (ref 0.3–1.2)
Total Protein: 7.1 g/dL (ref 6.5–8.1)

## 2020-11-09 LAB — TSH: TSH: 2.1 u[IU]/mL (ref 0.320–4.118)

## 2020-11-09 MED ORDER — SODIUM CHLORIDE 0.9% FLUSH
10.0000 mL | Freq: Once | INTRAVENOUS | Status: AC
  Administered 2020-11-09: 10 mL

## 2020-11-09 MED ORDER — HEPARIN SOD (PORK) LOCK FLUSH 100 UNIT/ML IV SOLN
500.0000 [IU] | Freq: Once | INTRAVENOUS | Status: AC
  Administered 2020-11-09: 500 [IU]

## 2020-11-09 NOTE — Progress Notes (Signed)
[facility] [phone]   [phone]  OFFICE PROGRESS NOTE  [name], MD (Inactive) [place] [address]  DIAGNOSIS: Stage IV non-small cell lung cancer, squamous cell carcinoma.  He presented with a right upper lobe lung mass, left upper lobe lung mass, and a loculated left pleural effusion.  He had mild hypermetabolism in the right hilar and paramediastinal lymphadenopathy.  He had a few small bone lesions on C2, L5, right ribs, and right iliac crest.  He was diagnosed in March 2022   PD-L1: 0%.   PRIOR THERAPY: Systemic chemotherapy with carboplatin for an AUC of 5, paclitaxel 175 mg/m2, Keytruda 200 mg IV 3 weeks with Neulasta support.  First dose expected on 05/02/2020.  Status post 9 cycles.  Last dose was given 10/19/2020 discontinued secondary to disease progression   CURRENT THERAPY: Second line systemic chemotherapy with docetaxel 75 Mg/M2 and Cyramza 10 Mg/KG every 3 weeks with Neulasta support.  First dose November 16, 2020.   INTERVAL HISTORY: Riley Sharp 75 y.o. male returns to the clinic today for follow-up visit.  The patient continues to complain of increasing fatigue and weakness as well as cough and shortness of breath with exertion.  He denied having any chest pain or hemoptysis.  He denied having any nausea, vomiting, diarrhea or constipation.  He has no headache or visual changes.  He has no significant weight loss or night sweats.  He has no fever or chills.  He tolerated the previous cycle of his treatment with single agent Keytruda fairly well.  The patient had repeat CT scan of the chest, abdomen pelvis performed yesterday and he is here for evaluation and discussion of his scan results and treatment options.   MEDICAL HISTORY: Past Medical History:  Diagnosis Date   AAA (abdominal aortic aneurysm) (HCC)    CAD (coronary artery disease)    Cancer ([place] [facility])    basal cell carcimona right ear and  upper left at shoulder   Glaucoma    Hyperlipidemia    Hypertension    Myocardial infarction ([facility]) 03/2003   Wears glasses     ALLERGIES:  has No Known Allergies.  MEDICATIONS:  Current Outpatient Medications  Medication Sig Dispense Refill   albuterol (PROVENTIL) (2.5 MG/3ML) 0.083% nebulizer solution Take 3 mLs (2.5 mg total) by nebulization every 6 (six) hours as needed for wheezing or shortness of breath. 75 mL 12   albuterol (VENTOLIN HFA) 108 (90 Base) MCG/ACT inhaler Inhale 2 puffs into the lungs every 6 (six) hours as needed for wheezing or shortness of breath. 8 g 6   aspirin EC 81 MG tablet Take 81 mg by mouth daily.     brimonidine (ALPHAGAN) 0.2 % ophthalmic solution Place 1 drop into both eyes 3 (three) times daily.     dorzolamide (TRUSOPT) 2 % ophthalmic solution Place 1 drop into both eyes 3 (three) times daily.     gabapentin (NEURONTIN) 100 MG capsule Take 1 capsule (100 mg total) by mouth 2 (two) times daily. 60 capsule 1   latanoprost (XALATAN) 0.005 % ophthalmic solution Place 1 drop into both eyes at bedtime.     lidocaine-prilocaine (EMLA) cream Apply 1 application topically as needed. 30 g 2   loratadine (CLARITIN) 10 MG tablet Take 10 mg by mouth daily.     metoprolol succinate (TOPROL-XL) 25 MG 24 hr tablet Take 25 mg by mouth daily.     ondansetron (  ZOFRAN) 8 MG tablet Take 1 tablet by mouth every 8 (eight) hours as needed.     prochlorperazine (COMPAZINE) 10 MG tablet Take 1 tablet (10 mg total) by mouth every 6 (six) hours as needed. 30 tablet 2   ramipril (ALTACE) 5 MG tablet Take 5 mg by mouth daily.     rosuvastatin (CRESTOR) 10 MG tablet Take 10 mg by mouth daily.     timolol (TIMOPTIC) 0.5 % ophthalmic solution Place 1 drop into both eyes daily.     Tiotropium Bromide-Olodaterol 2.5-2.5 MCG/ACT AERS Inhale 2 puffs into the lungs daily. 2 each 0   No current facility-administered medications for this visit.    SURGICAL HISTORY:  Past Surgical History:   Procedure Laterality Date   ABDOMINAL AORTIC ENDOVASCULAR STENT GRAFT N/A 11/14/2015   Procedure: ABDOMINAL AORTIC ENDOVASCULAR STENT GRAFT;  Surgeon: Elam Dutch, MD;  Location: [facility];  Service: Vascular;  Laterality: N/A;   APPENDECTOMY     BRONCHIAL BIOPSY  04/18/2020   Procedure: BRONCHIAL BIOPSIES;  Surgeon: [name], DO;  Location: [facility];  Service: Pulmonary;;   BRONCHIAL BRUSHINGS  04/18/2020   Procedure: BRONCHIAL BRUSHINGS;  Surgeon: [name], DO;  Location: [facility];  Service: Pulmonary;;   BRONCHIAL NEEDLE ASPIRATION BIOPSY  04/18/2020   Procedure: BRONCHIAL NEEDLE ASPIRATION BIOPSIES;  Surgeon: [name], DO;  Location: [facility];  Service: Pulmonary;;   BRONCHIAL WASHINGS  04/18/2020   Procedure: BRONCHIAL WASHINGS;  Surgeon: [name], DO;  Location: [facility];  Service: Pulmonary;;   CHOLECYSTECTOMY  2007   Gall Bladder   COLONOSCOPY W/ BIOPSIES AND POLYPECTOMY     EYE SURGERY Bilateral    laser for glaucoma both eyesn x 2   heart stent  Feb. 14, 2005   IR IMAGING GUIDED PORT INSERTION  05/03/2020   THORACENTESIS Left 04/18/2020   Procedure: THORACENTESIS;  Surgeon: [name], DO;  Location: [facility];  Service: Pulmonary;  Laterality: Left;   VIDEO BRONCHOSCOPY WITH ENDOBRONCHIAL NAVIGATION Bilateral 04/18/2020   Procedure: VIDEO BRONCHOSCOPY WITH ENDOBRONCHIAL NAVIGATION;  Surgeon: [name], DO;  Location: [facility];  Service: Pulmonary;  Laterality: Bilateral;    REVIEW OF SYSTEMS:  Constitutional: positive for fatigue Eyes: negative Ears, nose, mouth, throat, and face: negative Respiratory: positive for dyspnea on exertion Cardiovascular: negative Gastrointestinal: negative Genitourinary:negative Integument/breast: negative Hematologic/lymphatic: negative Musculoskeletal:negative Neurological: negative Behavioral/Psych: negative Endocrine: negative Allergic/Immunologic: negative   PHYSICAL  EXAMINATION: General appearance: alert, cooperative, fatigued, and no distress Head: Normocephalic, without obvious abnormality, atraumatic Neck: no adenopathy, no JVD, supple, symmetrical, trachea midline, and thyroid not enlarged, symmetric, no tenderness/mass/nodules Lymph nodes: Cervical, supraclavicular, and axillary nodes normal. Resp: clear to auscultation bilaterally Back: symmetric, no curvature. ROM normal. No CVA tenderness. Cardio: regular rate and rhythm, S1, S2 normal, no murmur, click, rub or gallop GI: soft, non-tender; bowel sounds normal; no masses,  no organomegaly Extremities: extremities normal, atraumatic, no cyanosis or edema Neurologic: Alert and oriented X 3, normal strength and tone. Normal symmetric reflexes. Normal coordination and gait  ECOG PERFORMANCE STATUS: 1 - Symptomatic but completely ambulatory  Blood pressure (!) 106/57, pulse 98, temperature (!) 97 F (36.1 C), temperature source Tympanic, resp. rate 19, height $RemoveBe'5\' 5"'HFabhODUd$  (1.651 m), weight 145 lb 9.6 oz (66 kg), SpO2 100 %.  LABORATORY DATA: Lab Results  Component Value Date   WBC 11.6 (H) 10/19/2020   HGB 12.7 (L) 10/19/2020   HCT 38.3 (L) 10/19/2020   MCV 92.3  10/19/2020   PLT 257 10/19/2020      Chemistry      Component Value Date/Time   NA 132 (L) 10/19/2020 0905   K 4.5 10/19/2020 0905   CL 101 10/19/2020 0905   CO2 21 (L) 10/19/2020 0905   BUN 15 10/19/2020 0905   CREATININE 1.03 10/19/2020 0905      Component Value Date/Time   CALCIUM 9.4 10/19/2020 0905   ALKPHOS 91 10/19/2020 0905   AST 13 (L) 10/19/2020 0905   ALT 8 10/19/2020 0905   BILITOT 0.5 10/19/2020 0905       RADIOGRAPHIC STUDIES: No results found.  ASSESSMENT AND PLAN: This is a very pleasant 75 years old white male recently diagnosed with a stage IV (T2b, N2, M1 C) non-small cell lung cancer, squamous cell carcinoma presented with right upper lobe lung mass, left upper lobe lung mass as well as loculated left  pleural effusion and right hilar and paramediastinal lymphadenopathy in addition to bone lesions C2 and L5, right ribs and right iliac crest diagnosed in March 2022 The patient is currently undergoing systemic chemotherapy with carboplatin for AUC of 5, paclitaxel 175 mg/M2 and Keytruda 200 mg IV every 3 weeks with Neulasta support status post 9 cycles.  Starting from cycle #5 the patient is on maintenance treatment with single agent Keytruda 200 Mg IV every 3 weeks. The patient has been tolerating this treatment well with no concerning adverse effects. He had repeat CT scan of the chest, abdomen pelvis performed yesterday.  Unfortunately the final report is not available but I personally and independently reviewed the scan images and discussed the results with the patient today. His scan showed evidence for disease progression with worsening of the left pleural effusion as well as development of multiple new liver metastasis.  I will wait for the final report for confirmation. I recommended for the patient to discontinue his current treatment with Beryle Flock today because of the disease progression. I discussed with him other treatment options including palliative care versus palliative systemic chemotherapy with docetaxel 75 Mg/M2 and Cyramza 10 Mg/KG every 3 weeks with Neulasta support. I discussed with the patient the adverse effect of this treatment including but not limited to alopecia, myelosuppression, nausea and vomiting, peripheral neuropathy, liver or renal dysfunction as well as the hemorrhagic adverse effect of Cyramza. I will call his pharmacy with prescription for Decadron 8 mg p.o. twice daily the day before, day of and day after the chemotherapy every 3 weeks. For the enlarging left pleural effusion, I will refer the patient to IR for therapeutic ultrasound guided thoracentesis. For the peripheral neuropathy, he will continue on his treatment with gabapentin 100 mg p.o. twice daily and will  adjust his dose for any further worsening of his peripheral neuropathy on the new treatment. He is expected to start the first cycle of this treatment next week. The patient will come back for follow-up visit in 2 weeks for evaluation and management of any adverse effect of his treatment. The patient was advised to call immediately if he has any other concerning symptoms in the interval. The patient voices understanding of current disease status and treatment options and is in agreement with the current care plan.  All questions were answered. The patient knows to call the clinic with any problems, questions or concerns. We can certainly see the patient much sooner if necessary.   Disclaimer: This note was dictated with voice recognition software. Similar sounding words can inadvertently be transcribed and may  not be corrected upon review.

## 2020-11-09 NOTE — Patient Instructions (Signed)
Thank you for choosing [facility] to provide your care.   Should you have questions after your visit to the [facility] ([facility]), please contact this office at [phone] between 8:30 AM and 4:30 PM.  Voice mails left after 4:00 PM may not be returned until the following business day.  Calls received after 4:30 PM will be answered by an off-site Nurse Triage Line.    Prescription Refills:  Please have your pharmacy contact us directly for most prescription requests.  Contact the office directly for refills of narcotics (pain medications). Allow 48-72 hours for refills.  Appointments: Please contact the [facility] scheduling department [phone] for questions regarding [facility] appointment scheduling.  Contact the schedulers with any scheduling changes so that your appointment can be rescheduled in a timely manner.   Central Scheduling for [facility] [phone] - Call to schedule procedures such as PET scans, CT scans, MRI, Ultrasound, etc.  To afford each patient quality time with our providers, please arrive 30 minutes before your scheduled appointment time.  If you arrive late for your appointment, you may be asked to reschedule.  We strive to give you quality time with our providers, and arriving late affects you and other patients whose appointments are after yours. If you are a no show for multiple scheduled visits, you may be dismissed from the clinic at the providers discretion.     Resources: [facility] Social Workers [phone] for additional information on assistance programs or assistance connecting with community support programs   [place] DSS  336-641-3447: Information regarding food stamps, Medicaid, and utility assistance [employer] [place] [phone]   [place] [employer]'s shared-ride transportation service for eligible riders who have a disability that prevents them from riding the fixed route bus.   Medicare Rights Center 800-333-4114  Helps people with Medicare understand their rights and benefits, navigate the Medicare system, and secure the quality healthcare they deserve [employer] 800-227-2345 Assists patients locate various types of support and financial assistance Cancer Care: 1-800-813-HOPE (4673) Provides financial assistance, online support groups, medication/co-pay assistance.   Transportation Assistance for appointments at [facility]: Transportation Coordinator [phone]  Again, thank you for choosing [facility] for your care.       

## 2020-11-09 NOTE — Progress Notes (Signed)
DISCONTINUE ON PATHWAY REGIMEN - Non-Small Cell Lung ? ? ?  A cycle is every 21 days: ?    Pembrolizumab  ?    Paclitaxel  ?    Carboplatin  ? ?**Always confirm dose/schedule in your pharmacy ordering system** ? ?REASON: Disease Progression ?PRIOR TREATMENT: [id number]: Pembrolizumab 200 mg + Carboplatin AUC=6 + Paclitaxel 200 mg/m2 q21 Days x 4 Cycles ?TREATMENT RESPONSE: Partial Response (PR) ? ?START ON PATHWAY REGIMEN - Non-Small Cell Lung ? ? ?  A cycle is every 21 days: ?    Ramucirumab  ?    Docetaxel  ? ?**Always confirm dose/schedule in your pharmacy ordering system** ? ?Patient Characteristics: ?Stage IV Metastatic, Squamous, Molecular Analysis Not Elected, PS = 0, 1, Second Line - Chemotherapy/Immunotherapy, Prior PD-1/PD-L1 Inhibitor + Chemotherapy or No Prior PD-1/PD-L1 Inhibitor, and Not a Candidate for Immunotherapy ?Therapeutic Status: Stage IV Metastatic ?Histology: Squamous Cell ?ECOG Performance Status: 1 ?Chemotherapy/Immunotherapy Line of Therapy: Second Line Chemotherapy/Immunotherapy ?Immunotherapy Candidate Status: Not a Candidate for Immunotherapy ?Prior Immunotherapy Status: Prior PD-1/PD-L1 Inhibitor + Chemotherapy ?Intent of Therapy: ?Non-Curative / Palliative Intent, Discussed with Patient ?

## 2020-11-10 ENCOUNTER — Telehealth: Payer: Self-pay | Admitting: Internal Medicine

## 2020-11-10 NOTE — Telephone Encounter (Signed)
Scheduled follow-up appointments per 10/12 los. Patient is aware.

## 2020-11-11 ENCOUNTER — Ambulatory Visit (HOSPITAL_COMMUNITY)
Admission: RE | Admit: 2020-11-11 | Discharge: 2020-11-11 | Disposition: A | Discharge status: Home / Self Care | Payer: Medicare Other | Source: Ambulatory Visit | Attending: Internal Medicine | Admitting: Internal Medicine

## 2020-11-11 ENCOUNTER — Other Ambulatory Visit (HOSPITAL_COMMUNITY): Payer: Self-pay | Admitting: Internal Medicine

## 2020-11-11 DIAGNOSIS — Z85118 Personal history of other malignant neoplasm of bronchus and lung: Secondary | ICD-10-CM | POA: Insufficient documentation

## 2020-11-11 DIAGNOSIS — R918 Other nonspecific abnormal finding of lung field: Secondary | ICD-10-CM | POA: Diagnosis not present

## 2020-11-11 DIAGNOSIS — J90 Pleural effusion, not elsewhere classified: Secondary | ICD-10-CM | POA: Diagnosis not present

## 2020-11-11 DIAGNOSIS — J9811 Atelectasis: Secondary | ICD-10-CM | POA: Diagnosis not present

## 2020-11-11 DIAGNOSIS — Z9889 Other specified postprocedural states: Secondary | ICD-10-CM

## 2020-11-11 LAB — BODY FLUID CELL COUNT WITH DIFFERENTIAL
Eos, Fluid: 0 %
Lymphs, Fluid: 6 %
Monocyte-Macrophage-Serous Fluid: 6 % — ABNORMAL LOW (ref 50–90)
Neutrophil Count, Fluid: 88 % — ABNORMAL HIGH (ref 0–25)
Total Nucleated Cell Count, Fluid: 10503 uL — ABNORMAL HIGH (ref 0–1000)

## 2020-11-11 MED ORDER — LIDOCAINE HCL 1 % IJ SOLN
INTRAMUSCULAR | Status: AC
  Administered 2020-11-11: 8 mL
  Filled 2020-11-11: qty 20

## 2020-11-11 NOTE — Procedures (Signed)
PROCEDURE SUMMARY:  Successful US guided left diagnostic and therapeutic thoracentesis. Yielded 1.1 L of cloudy, dark brown fluid. Pt tolerated procedure well. No immediate complications.  Specimen was sent for labs. CXR ordered.  EBL < 1 mL  Tyson Alias, NP 11/11/2020 11:28 AM

## 2020-11-14 ENCOUNTER — Other Ambulatory Visit: Payer: Self-pay | Admitting: Medical Oncology

## 2020-11-14 DIAGNOSIS — Z5111 Encounter for antineoplastic chemotherapy: Secondary | ICD-10-CM

## 2020-11-14 DIAGNOSIS — Z5112 Encounter for antineoplastic immunotherapy: Secondary | ICD-10-CM

## 2020-11-14 LAB — PATHOLOGIST SMEAR REVIEW

## 2020-11-14 LAB — BODY FLUID CULTURE W GRAM STAIN
Culture: NO GROWTH
Special Requests: NORMAL

## 2020-11-14 MED ORDER — DEXAMETHASONE 4 MG PO TABS: 8.0000 mg | ORAL_TABLET | Freq: Two times a day (BID) | ORAL | 0 refills | Status: AC

## 2020-11-15 ENCOUNTER — Inpatient Hospital Stay: Payer: Medicare Other

## 2020-11-15 ENCOUNTER — Other Ambulatory Visit: Payer: Self-pay

## 2020-11-15 ENCOUNTER — Other Ambulatory Visit: Payer: Self-pay | Admitting: Medical Oncology

## 2020-11-15 ENCOUNTER — Other Ambulatory Visit: Payer: Self-pay | Admitting: *Deleted

## 2020-11-15 VITALS — BP 137/77 | HR 111 | Temp 97.8°F | Resp 16 | Wt 147.8 lb

## 2020-11-15 DIAGNOSIS — J90 Pleural effusion, not elsewhere classified: Secondary | ICD-10-CM

## 2020-11-15 DIAGNOSIS — Z5111 Encounter for antineoplastic chemotherapy: Secondary | ICD-10-CM | POA: Diagnosis not present

## 2020-11-15 DIAGNOSIS — C3491 Malignant neoplasm of unspecified part of right bronchus or lung: Secondary | ICD-10-CM

## 2020-11-15 DIAGNOSIS — Z5112 Encounter for antineoplastic immunotherapy: Secondary | ICD-10-CM | POA: Diagnosis not present

## 2020-11-15 DIAGNOSIS — C3411 Malignant neoplasm of upper lobe, right bronchus or lung: Secondary | ICD-10-CM | POA: Diagnosis not present

## 2020-11-15 DIAGNOSIS — C787 Secondary malignant neoplasm of liver and intrahepatic bile duct: Secondary | ICD-10-CM | POA: Diagnosis not present

## 2020-11-15 DIAGNOSIS — C7951 Secondary malignant neoplasm of bone: Secondary | ICD-10-CM | POA: Diagnosis not present

## 2020-11-15 DIAGNOSIS — Z95828 Presence of other vascular implants and grafts: Secondary | ICD-10-CM

## 2020-11-15 LAB — CBC WITH DIFFERENTIAL (CANCER CENTER ONLY)
Abs Immature Granulocytes: 0.09 10*3/uL — ABNORMAL HIGH (ref 0.00–0.07)
Basophils Absolute: 0 10*3/uL (ref 0.0–0.1)
Basophils Relative: 0 %
Eosinophils Absolute: 0 10*3/uL (ref 0.0–0.5)
Eosinophils Relative: 0 %
HCT: 35.4 % — ABNORMAL LOW (ref 39.0–52.0)
Hemoglobin: 12.3 g/dL — ABNORMAL LOW (ref 13.0–17.0)
Immature Granulocytes: 1 %
Lymphocytes Relative: 2 %
Lymphs Abs: 0.4 10*3/uL — ABNORMAL LOW (ref 0.7–4.0)
MCH: 30.8 pg (ref 26.0–34.0)
MCHC: 34.7 g/dL (ref 30.0–36.0)
MCV: 88.5 fL (ref 80.0–100.0)
Monocytes Absolute: 0.2 10*3/uL (ref 0.1–1.0)
Monocytes Relative: 1 %
Neutro Abs: 17.6 10*3/uL — ABNORMAL HIGH (ref 1.7–7.7)
Neutrophils Relative %: 96 %
Platelet Count: 288 10*3/uL (ref 150–400)
RBC: 4 MIL/uL — ABNORMAL LOW (ref 4.22–5.81)
RDW: 13.1 % (ref 11.5–15.5)
WBC Count: 18.3 10*3/uL — ABNORMAL HIGH (ref 4.0–10.5)
nRBC: 0 % (ref 0.0–0.2)

## 2020-11-15 LAB — CMP (CANCER CENTER ONLY)
ALT: 10 U/L (ref 0–44)
AST: 14 U/L — ABNORMAL LOW (ref 15–41)
Albumin: 3.2 g/dL — ABNORMAL LOW (ref 3.5–5.0)
Alkaline Phosphatase: 85 U/L (ref 38–126)
Anion gap: 11 (ref 5–15)
BUN: 13 mg/dL (ref 8–23)
CO2: 19 mmol/L — ABNORMAL LOW (ref 22–32)
Calcium: 9.5 mg/dL (ref 8.9–10.3)
Chloride: 103 mmol/L (ref 98–111)
Creatinine: 0.81 mg/dL (ref 0.61–1.24)
GFR, Estimated: >60 mL/min (ref >60)
Glucose, Bld: 126 mg/dL — ABNORMAL HIGH (ref 70–99)
Potassium: 4.2 mmol/L (ref 3.5–5.1)
Sodium: 133 mmol/L — ABNORMAL LOW (ref 135–145)
Total Bilirubin: 0.2 mg/dL — ABNORMAL LOW (ref 0.3–1.2)
Total Protein: 7.3 g/dL (ref 6.5–8.1)

## 2020-11-15 LAB — TOTAL PROTEIN, URINE DIPSTICK

## 2020-11-15 MED ORDER — HEPARIN SOD (PORK) LOCK FLUSH 100 UNIT/ML IV SOLN
500.0000 [IU] | Freq: Once | INTRAVENOUS | Status: AC | PRN
  Administered 2020-11-15: 500 [IU]

## 2020-11-15 MED ORDER — ACETAMINOPHEN 325 MG PO TABS
650.0000 mg | ORAL_TABLET | Freq: Once | ORAL | Status: AC
  Administered 2020-11-15: 650 mg via ORAL
  Filled 2020-11-15: qty 2

## 2020-11-15 MED ORDER — SODIUM CHLORIDE 0.9 % IV SOLN
75.0000 mg/m2 | Freq: Once | INTRAVENOUS | Status: AC
  Administered 2020-11-15: 130 mg via INTRAVENOUS
  Filled 2020-11-15: qty 13

## 2020-11-15 MED ORDER — DIPHENHYDRAMINE HCL 50 MG/ML IJ SOLN
50.0000 mg | Freq: Once | INTRAMUSCULAR | Status: AC
  Administered 2020-11-15: 50 mg via INTRAVENOUS
  Filled 2020-11-15: qty 1

## 2020-11-15 MED ORDER — SODIUM CHLORIDE 0.9 % IV SOLN
10.0000 mg | Freq: Once | INTRAVENOUS | Status: AC
  Administered 2020-11-15: 10 mg via INTRAVENOUS
  Filled 2020-11-15: qty 10

## 2020-11-15 MED ORDER — SODIUM CHLORIDE 0.9 % IV SOLN: Freq: Once | INTRAVENOUS | Status: AC

## 2020-11-15 MED ORDER — SODIUM CHLORIDE 0.9 % IV SOLN
10.0000 mg/kg | Freq: Once | INTRAVENOUS | Status: AC
  Administered 2020-11-15: 700 mg via INTRAVENOUS
  Filled 2020-11-15: qty 50

## 2020-11-15 MED ORDER — SODIUM CHLORIDE 0.9% FLUSH
10.0000 mL | INTRAVENOUS | Status: DC | PRN
  Administered 2020-11-15: 10 mL

## 2020-11-15 MED ORDER — SODIUM CHLORIDE 0.9% FLUSH
10.0000 mL | Freq: Once | INTRAVENOUS | Status: AC
  Administered 2020-11-15: 10 mL

## 2020-11-15 NOTE — Progress Notes (Signed)
[facility] OFFICE PROGRESS NOTE  [name], MD (Inactive) [place] [address]  DIAGNOSIS: Stage IV non-small cell lung cancer, squamous cell carcinoma.  He presented with a right upper lobe lung mass, left upper lobe lung mass, and a loculated left pleural effusion.  He had mild hypermetabolism in the right hilar and paramediastinal lymphadenopathy.  He had a few small bone lesions on C2, L5, right ribs, and right iliac crest.  He was diagnosed in March 2022   PD-L1: 0%  PRIOR THERAPY: 1) Palliative radiotherapy to the lung for the hemoptysis under the care of Dr. Lisbeth Renshaw.  Last treatment on 06/24/2020 2) Systemic chemotherapy with carboplatin for an AUC of 5, paclitaxel 175 mg/m2, Keytruda 200 mg IV 3 weeks with Neulasta support. Status post 9 cycles. Starting from cycle #5 he had been on single agent maintenance Keytruda. Last dose 10/19/20  CURRENT THERAPY:  Second line systemic chemotherapy with docetaxel 75 Mg/M2 and Cyramza 10 Mg/KG every 3 weeks with Neulasta support.  First dose November 15, 2020. Status post 1 cycle.   INTERVAL HISTORY: Riley Sharp 75 y.o. male returns to the clinic today for a follow-up visit accompanied by his wife.  The patient was last seen in clinic on 11/09/2020.  At that time, the patient had a restaging CT scan performed which, unfortunately, showed evidence of disease progression.  Therefore, his treatment was switched to docetaxel and Cyramza.  He underwent his first cycle of this treatment last week.  He received his treatment on a Tuesday and felt well until after getting the Neulasta injection on day 3.  He began experiencing fatigue, decreased appetite, nausea and vomiting and diarrhea on Saturday following his treatment.  Regarding the diarrhea he states this happened 3 times a day.  He tried taking Pepto-Bismol and Imodium without any relief of his diarrhea.  He also had nausea and vomiting without any  improvement in his nausea with Compazine.  He has a prescription for Zofran per chart review.  He was compliant with taking the Claritin 1 time per day following his Neulasta injection.   Overall, he continues to feel fatigue today.  He notes some increased shortness of breath with exertion.  Of note, the patient recently had a loculated left pleural effusion drained.  He had 1.1 L drawn off on 11/11/2020. The patient is unable to tell when he reaccumulate's his pleural effusion.  He reports that his cough is "not as bad".  He denies any hemoptysis. He had 1 incident of chest discomfort that was nonexertional.  He is not in any pain at this time.  He also had 1 occasion of left sided neck pain which also subsided.  No associated lymphadenopathy, erythema, swelling, or palpable tenderness.  He denies any traumas or injuries to this region.  He also notes that his heart rate has been in the low 100s despite not exerting himself.  Denies any history of arrhythmia.  He is trying to stay hydrated with drinking vitamin water.  He had 1 "slight" headache for which he took Tylenol.  He is scheduled for a dental cleaning tomorrow and he is wondering if he can proceed with that as scheduled.  He denies any fever, night sweats, or chills.  He lost 3 pounds since his last appointment and reports taste alterations.  No evidence of thrush.  He drinks approximately 2 ensures per day. The patient is here today for evaluation, repeat blood work, and  a 1 week follow-up visit to manage any adverse side effects of treatment.    MEDICAL HISTORY: Past Medical History:  Diagnosis Date   AAA (abdominal aortic aneurysm)    CAD (coronary artery disease)    Cancer (HCC)    basal cell carcimona right ear and upper left at shoulder   Glaucoma    Hyperlipidemia    Hypertension    Myocardial infarction (HCC) 03/2003   Wears glasses     ALLERGIES:  has No Known Allergies.  MEDICATIONS:  Current Outpatient Medications   Medication Sig Dispense Refill   albuterol (PROVENTIL) (2.5 MG/3ML) 0.083% nebulizer solution Take 3 mLs (2.5 mg total) by nebulization every 6 (six) hours as needed for wheezing or shortness of breath. 75 mL 12   albuterol (VENTOLIN HFA) 108 (90 Base) MCG/ACT inhaler Inhale 2 puffs into the lungs every 6 (six) hours as needed for wheezing or shortness of breath. 8 g 6   aspirin EC 81 MG tablet Take 81 mg by mouth daily.     brimonidine (ALPHAGAN) 0.2 % ophthalmic solution Place 1 drop into both eyes 3 (three) times daily.     dexamethasone (DECADRON) 4 MG tablet Take 2 tablets (8 mg total) by mouth 2 (two) times daily with a meal. Decadron 8 mg p.o. twice daily the day before, day of and day after the chemotherapy every 3 weeks. 30 tablet 0   dorzolamide (TRUSOPT) 2 % ophthalmic solution Place 1 drop into both eyes 3 (three) times daily.     gabapentin (NEURONTIN) 100 MG capsule TAKE 1 CAPSULE BY MOUTH TWICE A DAY 60 capsule 1   latanoprost (XALATAN) 0.005 % ophthalmic solution Place 1 drop into both eyes at bedtime.     lidocaine-prilocaine (EMLA) cream Apply 1 application topically as needed. 30 g 2   loratadine (CLARITIN) 10 MG tablet Take 10 mg by mouth daily.     metoprolol succinate (TOPROL-XL) 25 MG 24 hr tablet Take 25 mg by mouth daily.     ondansetron (ZOFRAN) 8 MG tablet Take 1 tablet (8 mg total) by mouth every 8 (eight) hours as needed. 20 tablet 2   prochlorperazine (COMPAZINE) 10 MG tablet Take 1 tablet (10 mg total) by mouth every 6 (six) hours as needed. 30 tablet 2   ramipril (ALTACE) 5 MG tablet Take 5 mg by mouth daily.     rosuvastatin (CRESTOR) 10 MG tablet Take 10 mg by mouth daily.     timolol (TIMOPTIC) 0.5 % ophthalmic solution Place 1 drop into both eyes daily.     Tiotropium Bromide-Olodaterol 2.5-2.5 MCG/ACT AERS Inhale 2 puffs into the lungs daily. (Patient not taking: Reported on 11/09/2020) 2 each 0   No current facility-administered medications for this visit.     SURGICAL HISTORY:  Past Surgical History:  Procedure Laterality Date   ABDOMINAL AORTIC ENDOVASCULAR STENT GRAFT N/A 11/14/2015   Procedure: ABDOMINAL AORTIC ENDOVASCULAR STENT GRAFT;  Surgeon: Sherren Kerns, MD;  Location: [facility] OR;  Service: Vascular;  Laterality: N/A;   APPENDECTOMY     BRONCHIAL BIOPSY  04/18/2020   Procedure: BRONCHIAL BIOPSIES;  Surgeon: Josephine Igo, DO;  Location: [facility]COPY;  Service: Pulmonary;;   BRONCHIAL BRUSHINGS  04/18/2020   Procedure: BRONCHIAL BRUSHINGS;  Surgeon: Josephine Igo, DO;  Location: [facility];  Service: Pulmonary;;   BRONCHIAL NEEDLE ASPIRATION BIOPSY  04/18/2020   Procedure: BRONCHIAL NEEDLE ASPIRATION BIOPSIES;  Surgeon: Josephine Igo, DO;  Location: [facility];  Service: Pulmonary;;  BRONCHIAL WASHINGS  04/18/2020   Procedure: BRONCHIAL WASHINGS;  Surgeon: [name], DO;  Location: [facility];  Service: Pulmonary;;   CHOLECYSTECTOMY  2007   Gall Bladder   COLONOSCOPY W/ BIOPSIES AND POLYPECTOMY     EYE SURGERY Bilateral    laser for glaucoma both eyesn x 2   heart stent  Feb. 14, 2005   IR IMAGING GUIDED PORT INSERTION  05/03/2020   THORACENTESIS Left 04/18/2020   Procedure: THORACENTESIS;  Surgeon: [name], DO;  Location: [facility];  Service: Pulmonary;  Laterality: Left;   VIDEO BRONCHOSCOPY WITH ENDOBRONCHIAL NAVIGATION Bilateral 04/18/2020   Procedure: VIDEO BRONCHOSCOPY WITH ENDOBRONCHIAL NAVIGATION;  Surgeon: [name], DO;  Location: [facility];  Service: Pulmonary;  Laterality: Bilateral;    REVIEW OF SYSTEMS:   Review of Systems  Constitutional: Positive for fatigue, appetite change, and weight loss.  Negative for chills and fever. HENT: Positive for taste alterations.  Negative for mouth sores, nosebleeds, negative for thrush, sore throat and trouble swallowing.   Eyes: Negative for eye problems and icterus.  Respiratory: Positive for dyspnea on exertion.  Negative for cough,  hemoptysis, and wheezing.   Cardiovascular: Negative for chest pain (resolved) and leg swelling.  Gastrointestinal:  Positive for diarrhea (resolved).  Positive for nausea and few episodes of vomiting.  Negative for abdominal pain and constipation. Genitourinary: Negative for bladder incontinence, difficulty urinating, dysuria, frequency and hematuria.   Musculoskeletal: Positive for neck pain x1 occurrence. Negative for back pain, gait problem, and neck stiffness.  Skin: Negative for itching and rash.  Neurological: Negative for dizziness, extremity weakness, gait problem, headaches, light-headedness and seizures.  Hematological: Negative for adenopathy. Does not bruise/bleed easily.  Psychiatric/Behavioral: Negative for confusion, depression and sleep disturbance. The patient is not nervous/anxious.     PHYSICAL EXAMINATION:  Blood pressure 106/70, pulse (!) 105, temperature 97.9 F (36.6 C), temperature source Oral, resp. rate 20, height $RemoveBe'5\' 5"'KztblgEOe$  (1.651 m), weight 142 lb 8 oz (64.6 kg), SpO2 100 %.  ECOG PERFORMANCE STATUS: 1  Physical Exam  Constitutional: Oriented to person, place, and time and well-developed, well-nourished, and in no distress.  HENT:  Head: Normocephalic and atraumatic.  Mouth/Throat: Oropharynx is clear and moist. No oropharyngeal exudate.  Eyes: Conjunctivae are normal. Right eye exhibits no discharge. Left eye exhibits no discharge. No scleral icterus.  Neck: Normal range of motion. Neck supple.  Cardiovascular: mild tachycardia, regular rhythm, normal heart sounds and intact distal pulses.   Pulmonary/Chest: Effort normal.  Decreased breath sounds in left lower lung field.  No respiratory distress. No wheezes. No rales.  Abdominal: Soft. Bowel sounds are normal. Exhibits no distension and no mass. There is no tenderness.  Musculoskeletal: Normal range of motion. Exhibits no edema.  Lymphadenopathy:    No cervical adenopathy.  Neurological: Alert and oriented to  person, place, and time. Exhibits normal muscle tone. Gait normal. Coordination normal.  Skin: Skin is warm and dry. No rash noted. Not diaphoretic. No erythema. No pallor.  Psychiatric: Mood, memory and judgment normal.  Vitals reviewed.  LABORATORY DATA: Lab Results  Component Value Date   WBC 1.2 (L) 11/22/2020   HGB 13.0 11/22/2020   HCT 38.3 (L) 11/22/2020   MCV 88.0 11/22/2020   PLT 87 (L) 11/22/2020      Chemistry      Component Value Date/Time   NA 129 (L) 11/22/2020 1110   K 4.4 11/22/2020 1110   CL 98 11/22/2020 1110   CO2 22 11/22/2020  1110   BUN 17 11/22/2020 1110   CREATININE 0.84 11/22/2020 1110      Component Value Date/Time   CALCIUM 9.4 11/22/2020 1110   ALKPHOS 80 11/22/2020 1110   AST 41 11/22/2020 1110   ALT 14 11/22/2020 1110   BILITOT 1.0 11/22/2020 1110       RADIOGRAPHIC STUDIES:  DG Chest 1 View  Result Date: 11/11/2020 CLINICAL DATA:  Post thoracentesis on the left. EXAM: CHEST  1 VIEW COMPARISON:  Radiographs 09/19/2020 and 04/18/2020.  CT 11/08/2020. FINDINGS: 1135 hours. Right IJ Port-A-Cath extends to the level of the upper right atrium. The heart size and mediastinal contours are stable. The left pleural effusion has significantly decreased in volume compared with the recent CT. There is residual partially loculated fluid laterally which is similar to that demonstrated on radiographs of 2 months ago. Stable left lung compressive atelectasis and right upper lobe mass-like consolidation. There is no significant right-sided pleural effusion or pneumothorax. The bones appear unchanged. IMPRESSION: Interval decreased volume of left-sided pleural effusion without complicating pneumothorax. A residual partially loculated effusion laterally is similar to prior radiographs from 2 months ago. Stable underlying bilateral pulmonary opacities. Electronically Signed   By: Richardean Sale M.D.   On: 11/11/2020 12:10   CT Chest W Contrast  Result Date:  11/10/2020 CLINICAL DATA:  Lung cancer treated with chemotherapy and radiation therapy. Ongoing Keytruda. Worsening shortness of breath. EXAM: CT CHEST, ABDOMEN, AND PELVIS WITH CONTRAST TECHNIQUE: Multidetector CT imaging of the chest, abdomen and pelvis was performed following the standard protocol during bolus administration of intravenous contrast. CONTRAST:  48mL OMNIPAQUE IOHEXOL 350 MG/ML SOLN COMPARISON:  CT chest 09/19/2020 and CT chest abdomen pelvis 09/05/2020. FINDINGS: CT CHEST FINDINGS Cardiovascular: Right IJ Port-A-Cath terminates at the SVC RA junction. Atherosclerotic calcification of the aorta and coronary arteries. Heart size normal. Small pericardial effusion is new. There may be slight associated peripheral hyperattenuation. Mediastinum/Nodes: No pathologically enlarged mediastinal or hilar lymph nodes. Calcified subcarinal and hilar lymph nodes. No axillary adenopathy. Esophagus is unremarkable. Lungs/Pleura: Enlarging loculated thick-walled left pleural fluid collection with compressive atelectasis in the left upper and left lower lobes. Nodular and masslike areas of consolidation in both upper lobes appear similar to 09/19/2020. Centrilobular emphysema. 4 mm subpleural peripheral right lower lobe nodule (7/93), increased in size from 3 mm on 09/05/2020. No right pleural fluid. Airway is unremarkable. Musculoskeletal: There are new lytic lesions, namely within the right second and fifth ribs with a pathologic fracture involving the right fifth rib. Lytic lesion in the T7 vertebral body is new. Possible lytic lesion in the T1 vertebral body. CT ABDOMEN PELVIS FINDINGS Hepatobiliary: New mildly heterogeneous lesions in the liver measure up to 2.0 cm in the dome of the right hepatic lobe. Cholecystectomy. No abnormal biliary ductal dilatation. Pancreas: Negative. Spleen: Negative. Adrenals/Urinary Tract: Adrenal glands are unremarkable. Scarring in the lower pole right kidney. Subcentimeter  low-attenuation lesions in the kidneys are too small to characterize but statistically, cysts are likely. Ureters are decompressed. Bladder is grossly unremarkable. Stomach/Bowel: Stomach, small bowel and colon are unremarkable. Fair amount of stool is seen in the colon, indicative of constipation. Appendix is not readily visualized. Vascular/Lymphatic: Atherosclerotic calcification of the aorta with an endovascular aorto bi-iliac stent graft. Native aneurysm sac measures 5.5 cm, as before. Gastrohepatic ligament lymph nodes are not enlarged by CT size criteria. Reproductive: Prostate is visualized. Other: No free fluid.  Mesenteries and peritoneum are unremarkable. Musculoskeletal: New lytic lesion in the right iliac  wing. Degenerative changes in the spine. Levoconvex scoliosis. IMPRESSION: 1. Normal progression disease as evidenced by an enlarging right pulmonary nodule, new hepatic metastatic disease and new osseous metastatic disease. 2. Masslike areas of consolidation in the upper lobes, similar. 3. Large loculated left pleural effusion with pleural thickening, increased in size from 09/19/2020 and compatible with an empyema or malignant pleural effusion. 4. New small pericardial effusion. Peripheral hyperattenuation, difficult to exclude pericarditis. 5. Aorto bi-iliac endograft stent with stable native aneurysm sac measuring 5.5 cm. 6. Aortic atherosclerosis (ICD10-I70.0). Coronary artery calcification. 7.  Emphysema (ICD10-J43.9). Electronically Signed   By: Leanna Battles M.D.   On: 11/10/2020 11:38   CT Abdomen Pelvis W Contrast  Result Date: 11/10/2020 CLINICAL DATA:  Lung cancer treated with chemotherapy and radiation therapy. Ongoing Keytruda. Worsening shortness of breath. EXAM: CT CHEST, ABDOMEN, AND PELVIS WITH CONTRAST TECHNIQUE: Multidetector CT imaging of the chest, abdomen and pelvis was performed following the standard protocol during bolus administration of intravenous contrast. CONTRAST:   44mL OMNIPAQUE IOHEXOL 350 MG/ML SOLN COMPARISON:  CT chest 09/19/2020 and CT chest abdomen pelvis 09/05/2020. FINDINGS: CT CHEST FINDINGS Cardiovascular: Right IJ Port-A-Cath terminates at the SVC RA junction. Atherosclerotic calcification of the aorta and coronary arteries. Heart size normal. Small pericardial effusion is new. There may be slight associated peripheral hyperattenuation. Mediastinum/Nodes: No pathologically enlarged mediastinal or hilar lymph nodes. Calcified subcarinal and hilar lymph nodes. No axillary adenopathy. Esophagus is unremarkable. Lungs/Pleura: Enlarging loculated thick-walled left pleural fluid collection with compressive atelectasis in the left upper and left lower lobes. Nodular and masslike areas of consolidation in both upper lobes appear similar to 09/19/2020. Centrilobular emphysema. 4 mm subpleural peripheral right lower lobe nodule (7/93), increased in size from 3 mm on 09/05/2020. No right pleural fluid. Airway is unremarkable. Musculoskeletal: There are new lytic lesions, namely within the right second and fifth ribs with a pathologic fracture involving the right fifth rib. Lytic lesion in the T7 vertebral body is new. Possible lytic lesion in the T1 vertebral body. CT ABDOMEN PELVIS FINDINGS Hepatobiliary: New mildly heterogeneous lesions in the liver measure up to 2.0 cm in the dome of the right hepatic lobe. Cholecystectomy. No abnormal biliary ductal dilatation. Pancreas: Negative. Spleen: Negative. Adrenals/Urinary Tract: Adrenal glands are unremarkable. Scarring in the lower pole right kidney. Subcentimeter low-attenuation lesions in the kidneys are too small to characterize but statistically, cysts are likely. Ureters are decompressed. Bladder is grossly unremarkable. Stomach/Bowel: Stomach, small bowel and colon are unremarkable. Fair amount of stool is seen in the colon, indicative of constipation. Appendix is not readily visualized. Vascular/Lymphatic:  Atherosclerotic calcification of the aorta with an endovascular aorto bi-iliac stent graft. Native aneurysm sac measures 5.5 cm, as before. Gastrohepatic ligament lymph nodes are not enlarged by CT size criteria. Reproductive: Prostate is visualized. Other: No free fluid.  Mesenteries and peritoneum are unremarkable. Musculoskeletal: New lytic lesion in the right iliac wing. Degenerative changes in the spine. Levoconvex scoliosis. IMPRESSION: 1. Normal progression disease as evidenced by an enlarging right pulmonary nodule, new hepatic metastatic disease and new osseous metastatic disease. 2. Masslike areas of consolidation in the upper lobes, similar. 3. Large loculated left pleural effusion with pleural thickening, increased in size from 09/19/2020 and compatible with an empyema or malignant pleural effusion. 4. New small pericardial effusion. Peripheral hyperattenuation, difficult to exclude pericarditis. 5. Aorto bi-iliac endograft stent with stable native aneurysm sac measuring 5.5 cm. 6. Aortic atherosclerosis (ICD10-I70.0). Coronary artery calcification. 7.  Emphysema (ICD10-J43.9). Electronically Signed  By: Lorin Picket M.D.   On: 11/10/2020 11:38   US Thoracentesis Asp Pleural space w/IMG guide  Result Date: 11/11/2020 INDICATION: Patient with history stage IV non-small cell lung cancer, spine cell carcinoma with recurrent pleural effusion. Request for diagnostic and therapeutic thoracentesis. EXAM: ULTRASOUND GUIDED DIAGNOSTIC AND THERAPEUTIC THORACENTESIS MEDICATIONS: 8 mL 1% lidocaine COMPLICATIONS: None immediate. PROCEDURE: An ultrasound guided thoracentesis was thoroughly discussed with the patient and questions answered. The benefits, risks, alternatives and complications were also discussed. The patient understands and wishes to proceed with the procedure. Written consent was obtained. Ultrasound was performed to localize and mark an adequate pocket of fluid in the left chest. The area was  then prepped and draped in the normal sterile fashion. 1% Lidocaine was used for local anesthesia. Under ultrasound guidance a 6 Fr Safe-T-Centesis catheter was introduced. Thoracentesis was performed. The catheter was removed and a dressing applied. FINDINGS: A total of approximately 1.1 L of cloudy, dark brown fluid was removed. Samples were sent to the laboratory as requested by the clinical team. IMPRESSION: Successful ultrasound guided left thoracentesis yielding 1.1 L of pleural fluid. Read by: Narda Rutherford, NP Electronically Signed   By: Markus Daft M.D.   On: 11/11/2020 12:26     ASSESSMENT/PLAN:  This is a very pleasant 75 year old Caucasian male diagnosed with stage IV non-small cell lung cancer, squamous cell carcinoma.  The patient presented with a right upper lobe lung mass, left upper lobe lung mass, loculated left pleural effusion, mild hypermetabolism in the right hilar anterior mediastinal lymph nodes, and a few small metastatic bone lesions on C2, L5, right ribs, and right iliac crest.  He was diagnosed in March 2022.  His PD-L1 expression is 0%.    The patient underwent systemic chemotherapy with carboplatin for AUC of 5, paclitaxel 175 mg/M2 and Keytruda 200 mg IV every 3 weeks with Neulasta support status post 9 cycles of treatment.  Starting from cycle #5, the patient has been on maintenance single agent immunotherapy with Keytruda. This was discontinued due to disease progression.   The patient is currently undergoing systemic chemotherapy with docetaxel 75 mg per metered squared and Cyramza 10 mg/kg IV every 3 weeks with Neulasta support.   Reviewed the patient's symptoms with Dr. Julien Nordmann today.   Regarding the Neulasta associated joint aching/pain, discussed that this is a common side effect.  Advised to continue taking Claritin for 4 to 7 days following the injection.  May use Tylenol if needed.  The importance of the Neulasta injection is to prevent neutropenia, of which the  patient is neutropenic today.  We expect his total white blood cell count and ANC to improve over the next couple days; however, neutropenia precautions was reviewed with the patient. Knows to call if any symptoms of infection and to seek emergency evaluation if he develops a fever.  Discussed with the patient that it is not uncommon to feel fatigued 1 week following treatment.  His hemoglobin is within normal limits today.  Hopeful that his fatigue will improve in the next 2 weeks.  Regarding the fatigue, decreased appetite, nausea and vomiting, I encouraged the patient to extend his Decadron premedication out by 1 to 2 days which should help his nausea and vomiting, appetite, and fatigue.  If the steroids causes insomnia, advised to take it earlier in the day.  Continue to drink plenty of fluids and Ensure and Glucerna for weight loss.  I also discussed for his nausea that he may alternate between  Compazine and Zofran as well.  Due to his neutropenia, the patient was advised to cancel his dental cleaning tomorrow.  Regarding his taste alterations, the patient did not have any significant thrush on exam today.  Advised to perform good oral hygiene as well as use salt water or Biotene rinses.  We will see the patient back for follow-up visit in 2 weeks for evaluation and repeat blood work before starting cycle #2.  Patient was concerned about his persistent tachycardia.  Upon recheck, his pulse normalized and his rhythm was regular.  I reviewed his cardiac history with Dr. Julien Nordmann.  His last restaging CT scan showed a small pericardial effusion, Dr. Julien Nordmann felt this is unlikely to be the cause of his concerns the patient has a follow-up with his cardiologist in December.  I discussed with the patient if he has some specific concerns that he is always free to call a provider's office to see if he can be evaluated sooner.   The patient was advised to call immediately if he has any concerning symptoms in the  interval. The patient voices understanding of current disease status and treatment options and is in agreement with the current care plan. All questions were answered. The patient knows to call the clinic with any problems, questions or concerns. We can certainly see the patient much sooner if necessary       No orders of the defined types were placed in this encounter.    The total time spent in the appointment was 30-39 minutes.   Marla Pouliot L Tally Mattox, PA-C 11/22/20

## 2020-11-15 NOTE — Patient Instructions (Addendum)
[facility]  Discharge Instructions: Thank you for choosing [facility] to provide your oncology and hematology care.   If you have a lab appointment with the [facility], please go directly to the [facility] and check in at the registration area.   Wear comfortable clothing and clothing appropriate for easy access to any Portacath or PICC line.   We strive to give you quality time with your provider. You may need to reschedule your appointment if you arrive late (15 or more minutes).  Arriving late affects you and other patients whose appointments are after yours.  Also, if you miss three or more appointments without notifying the office, you may be dismissed from the clinic at the provider's discretion.      For prescription refill requests, have your pharmacy contact our office and allow 72 hours for refills to be completed.    Today you received the following chemotherapy and/or immunotherapy agents Ramucirumab and Docetaxel      To help prevent nausea and vomiting after your treatment, we encourage you to take your nausea medication as directed.  BELOW ARE SYMPTOMS THAT SHOULD BE REPORTED IMMEDIATELY: *FEVER GREATER THAN 100.4 F (38 C) OR HIGHER *CHILLS OR SWEATING *NAUSEA AND VOMITING THAT IS NOT CONTROLLED WITH YOUR NAUSEA MEDICATION *UNUSUAL SHORTNESS OF BREATH *UNUSUAL BRUISING OR BLEEDING *URINARY PROBLEMS (pain or burning when urinating, or frequent urination) *BOWEL PROBLEMS (unusual diarrhea, constipation, pain near the anus) TENDERNESS IN MOUTH AND THROAT WITH OR WITHOUT PRESENCE OF ULCERS (sore throat, sores in mouth, or a toothache) UNUSUAL RASH, SWELLING OR PAIN  UNUSUAL VAGINAL DISCHARGE OR ITCHING   Items with * indicate a potential emergency and should be followed up as soon as possible or go to the Emergency Department if any problems should occur.  Please show the CHEMOTHERAPY ALERT CARD or IMMUNOTHERAPY ALERT CARD  at check-in to the Emergency Department and triage nurse.  Should you have questions after your visit or need to cancel or reschedule your appointment, please contact [facility]  Dept: [phone]  and follow the prompts.  Office hours are 8:00 a.m. to 4:30 p.m. Monday - Friday. Please note that voicemails left after 4:00 p.m. may not be returned until the following business day.  We are closed weekends and major holidays. You have access to a nurse at all times for urgent questions. Please call the main number to the clinic Dept: [phone] and follow the prompts.   For any non-urgent questions, you may also contact your provider using MyChart. We now offer e-Visits for anyone 76 and older to request care online for non-urgent symptoms. For details visit [email or web address].   Also download the MyChart app! Go to the app store, search "MyChart", open the app, select [facility], and log in with your MyChart username and password.  Due to Covid, a mask is required upon entering the hospital/clinic. If you do not have a mask, one will be given to you upon arrival. For doctor visits, patients may have 1 support person aged 76 or older with them. For treatment visits, patients cannot have anyone with them due to current Covid guidelines and our immunocompromised population.   Ramucirumab injection What is this medication? RAMUCIRUMAB (ra mue SIR ue mab) is a monoclonal antibody. It is used to treat stomach cancer, colorectal cancer, liver cancer, and lung cancer. This medicine may be used for other purposes; ask your health care provider or pharmacist if you  have questions. COMMON BRAND NAME(S): Cyramza What should I tell my care team before I take this medication? They need to know if you have any of these conditions: bleeding disorders blood clots heart disease, including heart failure, heart attack, or chest pain (angina) high blood pressure infection  (especially a virus infection such as chickenpox, cold sores, or herpes) protein in your urine recent or planning to have surgery stroke an unusual or allergic reaction to ramucirumab, other medicines, foods, dyes, or preservatives pregnant or trying to get pregnant breast-feeding How should I use this medication? This medicine is for infusion into a vein. It is given by a health care professional in a hospital or clinic setting. Talk to your pediatrician regarding the use of this medicine in children. Special care may be needed. Overdosage: If you think you have taken too much of this medicine contact a poison control center or emergency room at once. NOTE: This medicine is only for you. Do not share this medicine with others. What if I miss a dose? It is important not to miss your dose. Call your doctor or health care professional if you are unable to keep an appointment. What may interact with this medication? Interactions have not been studied. This list may not describe all possible interactions. Give your health care provider a list of all the medicines, herbs, non-prescription drugs, or dietary supplements you use. Also tell them if you smoke, drink alcohol, or use illegal drugs. Some items may interact with your medicine. What should I watch for while using this medication? Your condition will be monitored carefully while you are receiving this medicine. You will need to to check your blood pressure and have your blood and urine tested while you are taking this medicine. Your condition will be monitored carefully while you are receiving this medicine. This medicine may increase your risk to bruise or bleed. Call your doctor or health care professional if you notice any unusual bleeding. Before having surgery, talk to your health care provider to make sure it is ok. This drug can increase the risk of poor healing of your surgical site or wound. You will need to stop this drug for 28 days  before surgery. After surgery, wait at least 2 weeks before restarting this drug. Make sure the surgical site or wound is healed enough before restarting this drug. Talk to your health care provider if questions. Do not become pregnant while taking this medicine or for 3 months after stopping it. Women should inform their doctor if they wish to become pregnant or think they might be pregnant. There is a potential for serious side effects to an unborn child. Talk to your health care professional or pharmacist for more information. Do not breast-feed an infant while taking this medicine or for 2 months after stopping it. This medicine may interfere with the ability to have a child. Talk with your doctor or health care professional if you are concerned about your fertility. What side effects may I notice from receiving this medication? Side effects that you should report to your doctor or health care professional as soon as possible: allergic reactions like skin rash, itching or hives, breathing problems, swelling of the face, lips, or tongue signs of infection - fever or chills, cough, sore throat chest pain or chest tightness confusion dizziness feeling faint or lightheaded, falls severe abdominal pain severe nausea, vomiting signs and symptoms of bleeding such as bloody or black, tarry stools; red or dark-brown urine; spitting up blood  or brown material that looks like coffee grounds; red spots on the skin; unusual bruising or bleeding from the eye, gums, or nose signs and symptoms of a blood clot such as breathing problems; changes in vision; chest pain; severe, sudden headache; pain, swelling, warmth in the leg; trouble speaking; sudden numbness or weakness of the face, arm or leg symptoms of a stroke: change in mental awareness, inability to talk or move one side of the body trouble walking, dizziness, loss of balance or coordination Side effects that usually do not require medical attention  (report to your doctor or health care professional if they continue or are bothersome): cold, clammy skin constipation diarrhea headache nausea, vomiting stomach pain unusually slow heartbeat unusually weak or tired This list may not describe all possible side effects. Call your doctor for medical advice about side effects. You may report side effects to FDA at 1-800-FDA-1088. Where should I keep my medication? This drug is given in a hospital or clinic and will not be stored at home. NOTE: This sheet is a summary. It may not cover all possible information. If you have questions about this medicine, talk to your doctor, pharmacist, or health care provider.  2022 Elsevier/Gold Standard (2018-11-12 11:17:50)  Docetaxel injection What is this medication? DOCETAXEL (doe se TAX el) is a chemotherapy drug. It targets fast dividing cells, like cancer cells, and causes these cells to die. This medicine is used to treat many types of cancers like breast cancer, certain stomach cancers, head and neck cancer, lung cancer, and prostate cancer. This medicine may be used for other purposes; ask your health care provider or pharmacist if you have questions. COMMON BRAND NAME(S): Docefrez, Taxotere What should I tell my care team before I take this medication? They need to know if you have any of these conditions: infection (especially a virus infection such as chickenpox, cold sores, or herpes) liver disease low blood counts, like low white cell, platelet, or red cell counts an unusual or allergic reaction to docetaxel, polysorbate 80, other chemotherapy agents, other medicines, foods, dyes, or preservatives pregnant or trying to get pregnant breast-feeding How should I use this medication? This drug is given as an infusion into a vein. It is administered in a hospital or clinic by a specially trained health care professional. Talk to your pediatrician regarding the use of this medicine in children.  Special care may be needed. Overdosage: If you think you have taken too much of this medicine contact a poison control center or emergency room at once. NOTE: This medicine is only for you. Do not share this medicine with others. What if I miss a dose? It is important not to miss your dose. Call your doctor or health care professional if you are unable to keep an appointment. What may interact with this medication? Do not take this medicine with any of the following medications: live virus vaccines This medicine may also interact with the following medications: aprepitant certain antibiotics like erythromycin or clarithromycin certain antivirals for HIV or hepatitis certain medicines for fungal infections like fluconazole, itraconazole, ketoconazole, posaconazole, or voriconazole cimetidine ciprofloxacin conivaptan cyclosporine dronedarone fluvoxamine grapefruit juice imatinib verapamil This list may not describe all possible interactions. Give your health care provider a list of all the medicines, herbs, non-prescription drugs, or dietary supplements you use. Also tell them if you smoke, drink alcohol, or use illegal drugs. Some items may interact with your medicine. What should I watch for while using this medication? Your condition will  be monitored carefully while you are receiving this medicine. You will need important blood work done while you are taking this medicine. Call your doctor or health care professional for advice if you get a fever, chills or sore throat, or other symptoms of a cold or flu. Do not treat yourself. This drug decreases your body's ability to fight infections. Try to avoid being around people who are sick. Some products may contain alcohol. Ask your health care professional if this medicine contains alcohol. Be sure to tell all health care professionals you are taking this medicine. Certain medicines, like metronidazole and disulfiram, can cause an unpleasant  reaction when taken with alcohol. The reaction includes flushing, headache, nausea, vomiting, sweating, and increased thirst. The reaction can last from 30 minutes to several hours. You may get drowsy or dizzy. Do not drive, use machinery, or do anything that needs mental alertness until you know how this medicine affects you. Do not stand or sit up quickly, especially if you are an older patient. This reduces the risk of dizzy or fainting spells. Alcohol may interfere with the effect of this medicine. Talk to your health care professional about your risk of cancer. You may be more at risk for certain types of cancer if you take this medicine. Do not become pregnant while taking this medicine or for 6 months after stopping it. Women should inform their doctor if they wish to become pregnant or think they might be pregnant. There is a potential for serious side effects to an unborn child. Talk to your health care professional or pharmacist for more information. Do not breast-feed an infant while taking this medicine or for 1 week after stopping it. Males who get this medicine must use a condom during sex with females who can get pregnant. If you get a woman pregnant, the baby could have birth defects. The baby could die before they are born. You will need to continue wearing a condom for 3 months after stopping the medicine. Tell your health care provider right away if your partner becomes pregnant while you are taking this medicine. This may interfere with the ability to father a child. You should talk to your doctor or health care professional if you are concerned about your fertility. What side effects may I notice from receiving this medication? Side effects that you should report to your doctor or health care professional as soon as possible: allergic reactions like skin rash, itching or hives, swelling of the face, lips, or tongue blurred vision breathing problems changes in vision low blood counts  - This drug may decrease the number of white blood cells, red blood cells and platelets. You may be at increased risk for infections and bleeding. nausea and vomiting pain, redness or irritation at site where injected pain, tingling, numbness in the hands or feet redness, blistering, peeling, or loosening of the skin, including inside the mouth signs of decreased platelets or bleeding - bruising, pinpoint red spots on the skin, black, tarry stools, nosebleeds signs of decreased red blood cells - unusually weak or tired, fainting spells, lightheadedness signs of infection - fever or chills, cough, sore throat, pain or difficulty passing urine swelling of the ankle, feet, hands Side effects that usually do not require medical attention (report to your doctor or health care professional if they continue or are bothersome): constipation diarrhea fingernail or toenail changes hair loss loss of appetite mouth sores muscle pain This list may not describe all possible side effects. Call your  doctor for medical advice about side effects. You may report side effects to FDA at 1-800-FDA-1088. Where should I keep my medication? This drug is given in a hospital or clinic and will not be stored at home. NOTE: This sheet is a summary. It may not cover all possible information. If you have questions about this medicine, talk to your doctor, pharmacist, or health care provider.  2022 Elsevier/Gold Standard (2018-12-15 19:50:31)

## 2020-11-17 ENCOUNTER — Inpatient Hospital Stay: Payer: Medicare Other

## 2020-11-17 ENCOUNTER — Other Ambulatory Visit: Payer: Self-pay

## 2020-11-17 VITALS — BP 146/80 | HR 87 | Temp 97.5°F | Resp 16

## 2020-11-17 DIAGNOSIS — C7951 Secondary malignant neoplasm of bone: Secondary | ICD-10-CM | POA: Diagnosis not present

## 2020-11-17 DIAGNOSIS — Z5112 Encounter for antineoplastic immunotherapy: Secondary | ICD-10-CM | POA: Diagnosis not present

## 2020-11-17 DIAGNOSIS — C787 Secondary malignant neoplasm of liver and intrahepatic bile duct: Secondary | ICD-10-CM | POA: Diagnosis not present

## 2020-11-17 DIAGNOSIS — C3411 Malignant neoplasm of upper lobe, right bronchus or lung: Secondary | ICD-10-CM | POA: Diagnosis not present

## 2020-11-17 DIAGNOSIS — J90 Pleural effusion, not elsewhere classified: Secondary | ICD-10-CM | POA: Diagnosis not present

## 2020-11-17 DIAGNOSIS — Z5111 Encounter for antineoplastic chemotherapy: Secondary | ICD-10-CM | POA: Diagnosis not present

## 2020-11-17 DIAGNOSIS — C3491 Malignant neoplasm of unspecified part of right bronchus or lung: Secondary | ICD-10-CM

## 2020-11-17 MED ORDER — PEGFILGRASTIM-CBQV 6 MG/0.6ML ~~LOC~~ SOSY
6.0000 mg | PREFILLED_SYRINGE | Freq: Once | SUBCUTANEOUS | Status: AC
  Administered 2020-11-17: 6 mg via SUBCUTANEOUS
  Filled 2020-11-17: qty 0.6

## 2020-11-18 ENCOUNTER — Telehealth: Payer: Self-pay

## 2020-11-18 DIAGNOSIS — C3491 Malignant neoplasm of unspecified part of right bronchus or lung: Secondary | ICD-10-CM

## 2020-11-18 MED ORDER — ONDANSETRON HCL 8 MG PO TABS: 8.0000 mg | ORAL_TABLET | Freq: Three times a day (TID) | ORAL | 2 refills | Status: AC | PRN

## 2020-11-18 NOTE — Telephone Encounter (Signed)
Pt c/o nausea today and compazine is not helping. Pt states he does not have Zofran.  I have sent a refill of Zofran to confirmed pharmacy.

## 2020-11-21 ENCOUNTER — Other Ambulatory Visit: Payer: Self-pay | Admitting: Internal Medicine

## 2020-11-22 ENCOUNTER — Inpatient Hospital Stay: Payer: Medicare Other

## 2020-11-22 ENCOUNTER — Inpatient Hospital Stay (HOSPITAL_BASED_OUTPATIENT_CLINIC_OR_DEPARTMENT_OTHER): Payer: Medicare Other | Admitting: Physician Assistant

## 2020-11-22 ENCOUNTER — Other Ambulatory Visit: Payer: Self-pay

## 2020-11-22 VITALS — BP 106/70 | HR 105 | Temp 97.9°F | Resp 20 | Ht 65.0 in | Wt 142.5 lb

## 2020-11-22 DIAGNOSIS — R6521 Severe sepsis with septic shock: Secondary | ICD-10-CM | POA: Diagnosis not present

## 2020-11-22 DIAGNOSIS — A419 Sepsis, unspecified organism: Secondary | ICD-10-CM | POA: Diagnosis not present

## 2020-11-22 DIAGNOSIS — I3139 Other pericardial effusion (noninflammatory): Secondary | ICD-10-CM | POA: Diagnosis not present

## 2020-11-22 DIAGNOSIS — R0602 Shortness of breath: Secondary | ICD-10-CM | POA: Diagnosis not present

## 2020-11-22 DIAGNOSIS — J90 Pleural effusion, not elsewhere classified: Secondary | ICD-10-CM

## 2020-11-22 DIAGNOSIS — K529 Noninfective gastroenteritis and colitis, unspecified: Secondary | ICD-10-CM | POA: Diagnosis not present

## 2020-11-22 DIAGNOSIS — C3490 Malignant neoplasm of unspecified part of unspecified bronchus or lung: Secondary | ICD-10-CM | POA: Diagnosis not present

## 2020-11-22 DIAGNOSIS — R079 Chest pain, unspecified: Secondary | ICD-10-CM | POA: Diagnosis not present

## 2020-11-22 DIAGNOSIS — Z95828 Presence of other vascular implants and grafts: Secondary | ICD-10-CM

## 2020-11-22 DIAGNOSIS — C3491 Malignant neoplasm of unspecified part of right bronchus or lung: Secondary | ICD-10-CM

## 2020-11-22 DIAGNOSIS — Z20822 Contact with and (suspected) exposure to covid-19: Secondary | ICD-10-CM | POA: Diagnosis not present

## 2020-11-22 DIAGNOSIS — I468 Cardiac arrest due to other underlying condition: Secondary | ICD-10-CM | POA: Diagnosis not present

## 2020-11-22 DIAGNOSIS — Z66 Do not resuscitate: Secondary | ICD-10-CM | POA: Diagnosis not present

## 2020-11-22 DIAGNOSIS — K5730 Diverticulosis of large intestine without perforation or abscess without bleeding: Secondary | ICD-10-CM | POA: Diagnosis not present

## 2020-11-22 DIAGNOSIS — R9431 Abnormal electrocardiogram [ECG] [EKG]: Secondary | ICD-10-CM | POA: Diagnosis not present

## 2020-11-22 LAB — CBC WITH DIFFERENTIAL (CANCER CENTER ONLY)
Abs Immature Granulocytes: 0.01 10*3/uL (ref 0.00–0.07)
Basophils Absolute: 0 10*3/uL (ref 0.0–0.1)
Basophils Relative: 2 %
Eosinophils Absolute: 0.1 10*3/uL (ref 0.0–0.5)
Eosinophils Relative: 9 %
HCT: 38.3 % — ABNORMAL LOW (ref 39.0–52.0)
Hemoglobin: 13 g/dL (ref 13.0–17.0)
Immature Granulocytes: 1 %
Lymphocytes Relative: 25 %
Lymphs Abs: 0.3 10*3/uL — ABNORMAL LOW (ref 0.7–4.0)
MCH: 29.9 pg (ref 26.0–34.0)
MCHC: 33.9 g/dL (ref 30.0–36.0)
MCV: 88 fL (ref 80.0–100.0)
Monocytes Absolute: 0.5 10*3/uL (ref 0.1–1.0)
Monocytes Relative: 38 %
Neutro Abs: 0.3 10*3/uL — CL (ref 1.7–7.7)
Neutrophils Relative %: 25 %
Platelet Count: 87 10*3/uL — ABNORMAL LOW (ref 150–400)
RBC: 4.35 MIL/uL (ref 4.22–5.81)
RDW: 13 % (ref 11.5–15.5)
WBC Count: 1.2 10*3/uL — ABNORMAL LOW (ref 4.0–10.5)
nRBC: 0 % (ref 0.0–0.2)

## 2020-11-22 LAB — CMP (CANCER CENTER ONLY)
ALT: 14 U/L (ref 0–44)
AST: 41 U/L (ref 15–41)
Albumin: 2.8 g/dL — ABNORMAL LOW (ref 3.5–5.0)
Alkaline Phosphatase: 80 U/L (ref 38–126)
Anion gap: 9 (ref 5–15)
BUN: 17 mg/dL (ref 8–23)
CO2: 22 mmol/L (ref 22–32)
Calcium: 9.4 mg/dL (ref 8.9–10.3)
Chloride: 98 mmol/L (ref 98–111)
Creatinine: 0.84 mg/dL (ref 0.61–1.24)
GFR, Estimated: >60 mL/min (ref >60)
Glucose, Bld: 111 mg/dL — ABNORMAL HIGH (ref 70–99)
Potassium: 4.4 mmol/L (ref 3.5–5.1)
Sodium: 129 mmol/L — ABNORMAL LOW (ref 135–145)
Total Bilirubin: 1 mg/dL (ref 0.3–1.2)
Total Protein: 6.3 g/dL — ABNORMAL LOW (ref 6.5–8.1)

## 2020-11-22 MED ORDER — SODIUM CHLORIDE 0.9% FLUSH
10.0000 mL | Freq: Once | INTRAVENOUS | Status: AC
  Administered 2020-11-22: 10 mL

## 2020-11-22 MED ORDER — HEPARIN SOD (PORK) LOCK FLUSH 100 UNIT/ML IV SOLN
500.0000 [IU] | Freq: Once | INTRAVENOUS | Status: AC
  Administered 2020-11-22: 500 [IU]

## 2020-11-23 ENCOUNTER — Emergency Department (HOSPITAL_COMMUNITY): Payer: Medicare Other

## 2020-11-23 ENCOUNTER — Inpatient Hospital Stay (HOSPITAL_COMMUNITY): Payer: Medicare Other

## 2020-11-23 ENCOUNTER — Inpatient Hospital Stay (HOSPITAL_COMMUNITY): Payer: Medicare Other | Admitting: Certified Registered Nurse Anesthetist

## 2020-11-23 ENCOUNTER — Inpatient Hospital Stay (HOSPITAL_COMMUNITY)
Admission: EM | Admit: 2020-11-23 | Discharge: 2020-11-29 | DRG: 871 | Disposition: E | Payer: Medicare Other | Attending: Critical Care Medicine | Admitting: Critical Care Medicine

## 2020-11-23 ENCOUNTER — Telehealth: Payer: Self-pay | Admitting: Medical Oncology

## 2020-11-23 ENCOUNTER — Other Ambulatory Visit: Payer: Self-pay

## 2020-11-23 ENCOUNTER — Encounter (HOSPITAL_COMMUNITY): Payer: Self-pay | Admitting: Critical Care Medicine

## 2020-11-23 DIAGNOSIS — R7401 Elevation of levels of liver transaminase levels: Secondary | ICD-10-CM | POA: Diagnosis present

## 2020-11-23 DIAGNOSIS — Z20822 Contact with and (suspected) exposure to covid-19: Secondary | ICD-10-CM | POA: Diagnosis present

## 2020-11-23 DIAGNOSIS — Z8249 Family history of ischemic heart disease and other diseases of the circulatory system: Secondary | ICD-10-CM

## 2020-11-23 DIAGNOSIS — R0902 Hypoxemia: Secondary | ICD-10-CM | POA: Diagnosis not present

## 2020-11-23 DIAGNOSIS — Z4659 Encounter for fitting and adjustment of other gastrointestinal appliance and device: Secondary | ICD-10-CM

## 2020-11-23 DIAGNOSIS — I468 Cardiac arrest due to other underlying condition: Secondary | ICD-10-CM | POA: Diagnosis not present

## 2020-11-23 DIAGNOSIS — D84821 Immunodeficiency due to drugs: Secondary | ICD-10-CM | POA: Diagnosis present

## 2020-11-23 DIAGNOSIS — R197 Diarrhea, unspecified: Secondary | ICD-10-CM | POA: Diagnosis not present

## 2020-11-23 DIAGNOSIS — K5900 Constipation, unspecified: Secondary | ICD-10-CM | POA: Diagnosis present

## 2020-11-23 DIAGNOSIS — C3411 Malignant neoplasm of upper lobe, right bronchus or lung: Secondary | ICD-10-CM | POA: Diagnosis present

## 2020-11-23 DIAGNOSIS — C3490 Malignant neoplasm of unspecified part of unspecified bronchus or lung: Secondary | ICD-10-CM | POA: Diagnosis not present

## 2020-11-23 DIAGNOSIS — T451X5A Adverse effect of antineoplastic and immunosuppressive drugs, initial encounter: Secondary | ICD-10-CM | POA: Diagnosis present

## 2020-11-23 DIAGNOSIS — R0603 Acute respiratory distress: Secondary | ICD-10-CM | POA: Diagnosis not present

## 2020-11-23 DIAGNOSIS — C782 Secondary malignant neoplasm of pleura: Secondary | ICD-10-CM | POA: Diagnosis present

## 2020-11-23 DIAGNOSIS — R6520 Severe sepsis without septic shock: Secondary | ICD-10-CM | POA: Diagnosis not present

## 2020-11-23 DIAGNOSIS — L8990 Pressure ulcer of unspecified site, unspecified stage: Secondary | ICD-10-CM | POA: Insufficient documentation

## 2020-11-23 DIAGNOSIS — I469 Cardiac arrest, cause unspecified: Secondary | ICD-10-CM | POA: Diagnosis not present

## 2020-11-23 DIAGNOSIS — C787 Secondary malignant neoplasm of liver and intrahepatic bile duct: Secondary | ICD-10-CM | POA: Diagnosis present

## 2020-11-23 DIAGNOSIS — R079 Chest pain, unspecified: Secondary | ICD-10-CM | POA: Diagnosis not present

## 2020-11-23 DIAGNOSIS — K529 Noninfective gastroenteritis and colitis, unspecified: Secondary | ICD-10-CM | POA: Diagnosis not present

## 2020-11-23 DIAGNOSIS — H409 Unspecified glaucoma: Secondary | ICD-10-CM | POA: Diagnosis present

## 2020-11-23 DIAGNOSIS — R1030 Lower abdominal pain, unspecified: Secondary | ICD-10-CM

## 2020-11-23 DIAGNOSIS — Z66 Do not resuscitate: Secondary | ICD-10-CM | POA: Diagnosis present

## 2020-11-23 DIAGNOSIS — C7951 Secondary malignant neoplasm of bone: Secondary | ICD-10-CM | POA: Diagnosis present

## 2020-11-23 DIAGNOSIS — R9431 Abnormal electrocardiogram [ECG] [EKG]: Secondary | ICD-10-CM | POA: Diagnosis not present

## 2020-11-23 DIAGNOSIS — N179 Acute kidney failure, unspecified: Secondary | ICD-10-CM | POA: Diagnosis present

## 2020-11-23 DIAGNOSIS — E871 Hypo-osmolality and hyponatremia: Secondary | ICD-10-CM | POA: Diagnosis present

## 2020-11-23 DIAGNOSIS — Z789 Other specified health status: Secondary | ICD-10-CM

## 2020-11-23 DIAGNOSIS — L89151 Pressure ulcer of sacral region, stage 1: Secondary | ICD-10-CM | POA: Diagnosis present

## 2020-11-23 DIAGNOSIS — I252 Old myocardial infarction: Secondary | ICD-10-CM | POA: Diagnosis not present

## 2020-11-23 DIAGNOSIS — R6521 Severe sepsis with septic shock: Secondary | ICD-10-CM | POA: Diagnosis not present

## 2020-11-23 DIAGNOSIS — E872 Acidosis, unspecified: Secondary | ICD-10-CM | POA: Diagnosis present

## 2020-11-23 DIAGNOSIS — I3139 Other pericardial effusion (noninflammatory): Secondary | ICD-10-CM | POA: Diagnosis present

## 2020-11-23 DIAGNOSIS — R739 Hyperglycemia, unspecified: Secondary | ICD-10-CM | POA: Diagnosis present

## 2020-11-23 DIAGNOSIS — I2510 Atherosclerotic heart disease of native coronary artery without angina pectoris: Secondary | ICD-10-CM | POA: Diagnosis present

## 2020-11-23 DIAGNOSIS — A419 Sepsis, unspecified organism: Secondary | ICD-10-CM | POA: Diagnosis not present

## 2020-11-23 DIAGNOSIS — J90 Pleural effusion, not elsewhere classified: Secondary | ICD-10-CM

## 2020-11-23 DIAGNOSIS — Z83438 Family history of other disorder of lipoprotein metabolism and other lipidemia: Secondary | ICD-10-CM

## 2020-11-23 DIAGNOSIS — I739 Peripheral vascular disease, unspecified: Secondary | ICD-10-CM | POA: Diagnosis present

## 2020-11-23 DIAGNOSIS — I10 Essential (primary) hypertension: Secondary | ICD-10-CM | POA: Diagnosis present

## 2020-11-23 DIAGNOSIS — C3412 Malignant neoplasm of upper lobe, left bronchus or lung: Secondary | ICD-10-CM | POA: Diagnosis present

## 2020-11-23 DIAGNOSIS — Z87891 Personal history of nicotine dependence: Secondary | ICD-10-CM

## 2020-11-23 DIAGNOSIS — K5289 Other specified noninfective gastroenteritis and colitis: Secondary | ICD-10-CM | POA: Diagnosis not present

## 2020-11-23 DIAGNOSIS — K5730 Diverticulosis of large intestine without perforation or abscess without bleeding: Secondary | ICD-10-CM | POA: Diagnosis not present

## 2020-11-23 DIAGNOSIS — R069 Unspecified abnormalities of breathing: Secondary | ICD-10-CM | POA: Diagnosis not present

## 2020-11-23 DIAGNOSIS — R1084 Generalized abdominal pain: Secondary | ICD-10-CM | POA: Diagnosis not present

## 2020-11-23 DIAGNOSIS — J910 Malignant pleural effusion: Secondary | ICD-10-CM | POA: Diagnosis not present

## 2020-11-23 DIAGNOSIS — R0602 Shortness of breath: Secondary | ICD-10-CM

## 2020-11-23 DIAGNOSIS — E785 Hyperlipidemia, unspecified: Secondary | ICD-10-CM | POA: Diagnosis present

## 2020-11-23 DIAGNOSIS — J9811 Atelectasis: Secondary | ICD-10-CM | POA: Diagnosis not present

## 2020-11-23 DIAGNOSIS — Z7982 Long term (current) use of aspirin: Secondary | ICD-10-CM

## 2020-11-23 DIAGNOSIS — Z955 Presence of coronary angioplasty implant and graft: Secondary | ICD-10-CM

## 2020-11-23 DIAGNOSIS — Z79899 Other long term (current) drug therapy: Secondary | ICD-10-CM

## 2020-11-23 DIAGNOSIS — A09 Infectious gastroenteritis and colitis, unspecified: Secondary | ICD-10-CM | POA: Diagnosis present

## 2020-11-23 DIAGNOSIS — K2970 Gastritis, unspecified, without bleeding: Secondary | ICD-10-CM | POA: Diagnosis not present

## 2020-11-23 DIAGNOSIS — Z4682 Encounter for fitting and adjustment of non-vascular catheter: Secondary | ICD-10-CM | POA: Diagnosis not present

## 2020-11-23 HISTORY — DX: Malignant neoplasm of unspecified part of unspecified bronchus or lung: C34.90

## 2020-11-23 LAB — CBC WITH DIFFERENTIAL/PLATELET
Abs Immature Granulocytes: 0.42 10*3/uL — ABNORMAL HIGH (ref 0.00–0.07)
Basophils Absolute: 0 10*3/uL (ref 0.0–0.1)
Basophils Relative: 1 %
Eosinophils Absolute: 0 10*3/uL (ref 0.0–0.5)
Eosinophils Relative: 0 %
HCT: 41.5 % (ref 39.0–52.0)
Hemoglobin: 14.2 g/dL (ref 13.0–17.0)
Immature Granulocytes: 5 %
Lymphocytes Relative: 3 %
Lymphs Abs: 0.3 10*3/uL — ABNORMAL LOW (ref 0.7–4.0)
MCH: 30.1 pg (ref 26.0–34.0)
MCHC: 34.2 g/dL (ref 30.0–36.0)
MCV: 88.1 fL (ref 80.0–100.0)
Monocytes Absolute: 0.5 10*3/uL (ref 0.1–1.0)
Monocytes Relative: 5 %
Neutro Abs: 7.4 10*3/uL (ref 1.7–7.7)
Neutrophils Relative %: 86 %
Platelets: DECREASED 10*3/uL (ref 150–400)
RBC: 4.71 MIL/uL (ref 4.22–5.81)
RDW: 13.5 % (ref 11.5–15.5)
WBC: 8.6 10*3/uL (ref 4.0–10.5)
nRBC: 1.3 % — ABNORMAL HIGH (ref 0.0–0.2)

## 2020-11-23 LAB — TROPONIN I (HIGH SENSITIVITY)
Troponin I (High Sensitivity): 43 ng/L — ABNORMAL HIGH (ref <18)
Troponin I (High Sensitivity): 44 ng/L — ABNORMAL HIGH (ref <18)

## 2020-11-23 LAB — LACTIC ACID, PLASMA
Lactic Acid, Venous: 5 mmol/L (ref 0.5–1.9)
Lactic Acid, Venous: 6.9 mmol/L (ref 0.5–1.9)
Lactic Acid, Venous: 6.6 mmol/L (ref 0.5–1.9)

## 2020-11-23 LAB — URINALYSIS, ROUTINE W REFLEX MICROSCOPIC
Bilirubin Urine: NEGATIVE
Glucose, UA: NEGATIVE mg/dL
Ketones, ur: 5 mg/dL — AB
Leukocytes,Ua: NEGATIVE
Nitrite: NEGATIVE
Protein, ur: 30 mg/dL — AB
Specific Gravity, Urine: 1.032 — ABNORMAL HIGH (ref 1.005–1.030)
pH: 6 (ref 5.0–8.0)

## 2020-11-23 LAB — BASIC METABOLIC PANEL
Anion gap: 12 (ref 5–15)
BUN: 28 mg/dL — ABNORMAL HIGH (ref 8–23)
CO2: 16 mmol/L — ABNORMAL LOW (ref 22–32)
Calcium: 7.3 mg/dL — ABNORMAL LOW (ref 8.9–10.3)
Chloride: 102 mmol/L (ref 98–111)
Creatinine, Ser: 1.62 mg/dL — ABNORMAL HIGH (ref 0.61–1.24)
GFR, Estimated: 44 mL/min — ABNORMAL LOW (ref >60)
Glucose, Bld: 94 mg/dL (ref 70–99)
Potassium: 5 mmol/L (ref 3.5–5.1)
Sodium: 130 mmol/L — ABNORMAL LOW (ref 135–145)

## 2020-11-23 LAB — COMPREHENSIVE METABOLIC PANEL
ALT: 18 U/L (ref 0–44)
AST: 43 U/L — ABNORMAL HIGH (ref 15–41)
Albumin: 2.9 g/dL — ABNORMAL LOW (ref 3.5–5.0)
Alkaline Phosphatase: 88 U/L (ref 38–126)
Anion gap: 14 (ref 5–15)
BUN: 31 mg/dL — ABNORMAL HIGH (ref 8–23)
CO2: 18 mmol/L — ABNORMAL LOW (ref 22–32)
Calcium: 8.5 mg/dL — ABNORMAL LOW (ref 8.9–10.3)
Chloride: 94 mmol/L — ABNORMAL LOW (ref 98–111)
Creatinine, Ser: 1.57 mg/dL — ABNORMAL HIGH (ref 0.61–1.24)
GFR, Estimated: 45 mL/min — ABNORMAL LOW (ref >60)
Glucose, Bld: 105 mg/dL — ABNORMAL HIGH (ref 70–99)
Potassium: 4.8 mmol/L (ref 3.5–5.1)
Sodium: 126 mmol/L — ABNORMAL LOW (ref 135–145)
Total Bilirubin: 1.1 mg/dL (ref 0.3–1.2)
Total Protein: 6.2 g/dL — ABNORMAL LOW (ref 6.5–8.1)

## 2020-11-23 LAB — BLOOD GAS, ARTERIAL
Acid-base deficit: 12.8 mmol/L — ABNORMAL HIGH (ref 0.0–2.0)
Bicarbonate: 13.5 mmol/L — ABNORMAL LOW (ref 20.0–28.0)
O2 Content: 15 L/min
O2 Saturation: 97.2 %
Patient temperature: 39
pCO2 arterial: 36.3 mmHg (ref 32.0–48.0)
pH, Arterial: 7.208 — ABNORMAL LOW (ref 7.350–7.450)
pO2, Arterial: 147 mmHg — ABNORMAL HIGH (ref 83.0–108.0)

## 2020-11-23 LAB — TYPE AND SCREEN
ABO/RH(D): O POS
Antibody Screen: NEGATIVE

## 2020-11-23 LAB — LIPASE, BLOOD: Lipase: 16 U/L (ref 11–51)

## 2020-11-23 LAB — GLUCOSE, CAPILLARY: Glucose-Capillary: 93 mg/dL (ref 70–99)

## 2020-11-23 LAB — RESP PANEL BY RT-PCR (FLU A&B, COVID) ARPGX2
Influenza A by PCR: NEGATIVE
Influenza B by PCR: NEGATIVE
SARS Coronavirus 2 by RT PCR: NEGATIVE

## 2020-11-23 LAB — HEMOGLOBIN A1C
Hgb A1c MFr Bld: 6.3 % — ABNORMAL HIGH (ref 4.8–5.6)
Mean Plasma Glucose: 134.11 mg/dL

## 2020-11-23 MED ORDER — IOHEXOL 350 MG/ML SOLN
80.0000 mL | Freq: Once | INTRAVENOUS | Status: AC | PRN
  Administered 2020-11-23: 80 mL via INTRAVENOUS

## 2020-11-23 MED ORDER — SODIUM CHLORIDE 0.9% FLUSH
10.0000 mL | Freq: Two times a day (BID) | INTRAVENOUS | Status: DC
  Administered 2020-11-23: 10 mL

## 2020-11-23 MED ORDER — HYDROMORPHONE HCL 1 MG/ML IJ SOLN
1.0000 mg | Freq: Once | INTRAMUSCULAR | Status: AC
Start: 2020-11-23 — End: 2020-11-23
  Administered 2020-11-23: 1 mg via INTRAVENOUS
  Filled 2020-11-23: qty 1

## 2020-11-23 MED ORDER — POLYETHYLENE GLYCOL 3350 17 G PO PACK: 17.0000 g | PACK | Freq: Every day | ORAL | Status: DC | PRN

## 2020-11-23 MED ORDER — CHLORHEXIDINE GLUCONATE CLOTH 2 % EX PADS: 6.0000 | MEDICATED_PAD | Freq: Every day | CUTANEOUS | Status: DC

## 2020-11-23 MED ORDER — PHENYLEPHRINE 40 MCG/ML (10ML) SYRINGE FOR IV PUSH (FOR BLOOD PRESSURE SUPPORT)
PREFILLED_SYRINGE | INTRAVENOUS | Status: AC
  Filled 2020-11-23: qty 30

## 2020-11-23 MED ORDER — NOREPINEPHRINE 4 MG/250ML-% IV SOLN
2.0000 ug/min | INTRAVENOUS | Status: DC
  Administered 2020-11-23: 2 ug/min via INTRAVENOUS
  Filled 2020-11-23: qty 250

## 2020-11-23 MED ORDER — ORAL CARE MOUTH RINSE: 15.0000 mL | Freq: Two times a day (BID) | OROMUCOSAL | Status: DC

## 2020-11-23 MED ORDER — POLYETHYLENE GLYCOL 3350 17 G PO PACK: 17.0000 g | PACK | Freq: Every day | ORAL | Status: DC

## 2020-11-23 MED ORDER — HEPARIN SODIUM (PORCINE) 5000 UNIT/ML IJ SOLN
5000.0000 [IU] | Freq: Three times a day (TID) | INTRAMUSCULAR | Status: DC
  Administered 2020-11-23 – 2020-11-24 (×2): 5000 [IU] via SUBCUTANEOUS
  Filled 2020-11-23 (×2): qty 1

## 2020-11-23 MED ORDER — PIPERACILLIN-TAZOBACTAM 3.375 G IVPB
3.3750 g | Freq: Three times a day (TID) | INTRAVENOUS | Status: DC
  Administered 2020-11-24: 3.375 g via INTRAVENOUS
  Filled 2020-11-23: qty 50

## 2020-11-23 MED ORDER — SODIUM CHLORIDE 0.9 % IV BOLUS
1000.0000 mL | Freq: Once | INTRAVENOUS | Status: AC
  Administered 2020-11-23: 1000 mL via INTRAVENOUS

## 2020-11-23 MED ORDER — LACTATED RINGERS IV BOLUS
500.0000 mL | Freq: Once | INTRAVENOUS | Status: AC
  Administered 2020-11-23: 500 mL via INTRAVENOUS

## 2020-11-23 MED ORDER — PANTOPRAZOLE SODIUM 40 MG IV SOLR
40.0000 mg | Freq: Every day | INTRAVENOUS | Status: DC
  Administered 2020-11-23: 40 mg via INTRAVENOUS

## 2020-11-23 MED ORDER — MORPHINE SULFATE (PF) 4 MG/ML IV SOLN
4.0000 mg | Freq: Once | INTRAVENOUS | Status: AC
  Administered 2020-11-23: 4 mg via INTRAVENOUS
  Filled 2020-11-23: qty 1

## 2020-11-23 MED ORDER — PIPERACILLIN-TAZOBACTAM 3.375 G IVPB 30 MIN
3.3750 g | Freq: Once | INTRAVENOUS | Status: AC
  Administered 2020-11-23: 3.375 g via INTRAVENOUS
  Filled 2020-11-23: qty 50

## 2020-11-23 MED ORDER — SODIUM BICARBONATE 8.4 % IV SOLN: 50.0000 meq | Freq: Once | INTRAVENOUS | Status: AC

## 2020-11-23 MED ORDER — VASOPRESSIN 20 UNITS/100 ML INFUSION FOR SHOCK
0.0000 [IU]/min | INTRAVENOUS | Status: DC
  Administered 2020-11-23 – 2020-11-24 (×2): 0.03 [IU]/min via INTRAVENOUS
  Filled 2020-11-23 (×2): qty 100

## 2020-11-23 MED ORDER — FENTANYL 2500MCG IN NS 250ML (10MCG/ML) PREMIX INFUSION
25.0000 ug/h | INTRAVENOUS | Status: DC
  Administered 2020-11-24: 25 ug/h via INTRAVENOUS
  Filled 2020-11-23: qty 250

## 2020-11-23 MED ORDER — ETOMIDATE 2 MG/ML IV SOLN
INTRAVENOUS | Status: DC | PRN
  Administered 2020-11-23: 12 mg via INTRAVENOUS

## 2020-11-23 MED ORDER — INSULIN ASPART 100 UNIT/ML IJ SOLN
0.0000 [IU] | INTRAMUSCULAR | Status: DC
Start: 2020-11-23 — End: 2020-11-24
  Filled 2020-11-23: qty 0.09

## 2020-11-23 MED ORDER — ACETAMINOPHEN 325 MG PO TABS: 650.0000 mg | ORAL_TABLET | ORAL | Status: DC | PRN

## 2020-11-23 MED ORDER — FLUTICASONE FUROATE-VILANTEROL 100-25 MCG/ACT IN AEPB
1.0000 | INHALATION_SPRAY | Freq: Every day | RESPIRATORY_TRACT | Status: DC
  Filled 2020-11-23: qty 28

## 2020-11-23 MED ORDER — LATANOPROST 0.005 % OP SOLN
1.0000 [drp] | Freq: Every day | OPHTHALMIC | Status: DC
  Administered 2020-11-24: 1 [drp] via OPHTHALMIC
  Filled 2020-11-23: qty 2.5

## 2020-11-23 MED ORDER — DOCUSATE SODIUM 50 MG/5ML PO LIQD: 100.0000 mg | Freq: Two times a day (BID) | ORAL | Status: DC

## 2020-11-23 MED ORDER — ONDANSETRON HCL 4 MG/2ML IJ SOLN: 4.0000 mg | Freq: Four times a day (QID) | INTRAMUSCULAR | Status: DC | PRN

## 2020-11-23 MED ORDER — LACTATED RINGERS IV BOLUS
30.0000 mL/kg | Freq: Once | INTRAVENOUS | Status: AC
  Administered 2020-11-23: 1938 mL via INTRAVENOUS

## 2020-11-23 MED ORDER — TIMOLOL MALEATE 0.5 % OP SOLN
1.0000 [drp] | Freq: Every day | OPHTHALMIC | Status: DC
  Filled 2020-11-23: qty 5

## 2020-11-23 MED ORDER — METHYLPREDNISOLONE SODIUM SUCC 125 MG IJ SOLR
60.0000 mg | Freq: Two times a day (BID) | INTRAMUSCULAR | Status: DC
  Administered 2020-11-23: 60 mg via INTRAVENOUS
  Filled 2020-11-23: qty 2

## 2020-11-23 MED ORDER — HYDROMORPHONE HCL 1 MG/ML IJ SOLN: 1.0000 mg | INTRAMUSCULAR | Status: DC | PRN

## 2020-11-23 MED ORDER — SODIUM CHLORIDE 0.9 % IV SOLN: INTRAVENOUS | Status: DC | PRN

## 2020-11-23 MED ORDER — SODIUM CHLORIDE 0.9% FLUSH: 10.0000 mL | INTRAVENOUS | Status: DC | PRN

## 2020-11-23 MED ORDER — FENTANYL BOLUS VIA INFUSION
25.0000 ug | INTRAVENOUS | Status: DC | PRN
Start: 2020-11-23 — End: 2020-11-24
  Administered 2020-11-24: 50 ug via INTRAVENOUS
  Filled 2020-11-23: qty 100

## 2020-11-23 MED ORDER — PROPOFOL 1000 MG/100ML IV EMUL
0.0000 ug/kg/min | INTRAVENOUS | Status: DC
  Administered 2020-11-24: 5 ug/kg/min via INTRAVENOUS
  Filled 2020-11-23: qty 100

## 2020-11-23 MED ORDER — LACTATED RINGERS IV BOLUS
1000.0000 mL | Freq: Once | INTRAVENOUS | Status: AC
  Administered 2020-11-23: 1000 mL via INTRAVENOUS

## 2020-11-23 MED ORDER — PHENYLEPHRINE 40 MCG/ML (10ML) SYRINGE FOR IV PUSH (FOR BLOOD PRESSURE SUPPORT)
PREFILLED_SYRINGE | INTRAVENOUS | Status: DC | PRN
  Administered 2020-11-23: 120 ug via INTRAVENOUS

## 2020-11-23 MED ORDER — NOREPINEPHRINE 4 MG/250ML-% IV SOLN
0.0000 ug/min | INTRAVENOUS | Status: DC
  Administered 2020-11-23: 4 ug/min via INTRAVENOUS
  Administered 2020-11-23 – 2020-11-24 (×2): 50 ug/min via INTRAVENOUS
  Filled 2020-11-23: qty 500

## 2020-11-23 MED ORDER — CHLORHEXIDINE GLUCONATE 0.12 % MT SOLN
15.0000 mL | Freq: Two times a day (BID) | OROMUCOSAL | Status: DC
  Administered 2020-11-24: 15 mL via OROMUCOSAL

## 2020-11-23 MED ORDER — FENTANYL CITRATE (PF) 100 MCG/2ML IJ SOLN
25.0000 ug | Freq: Once | INTRAMUSCULAR | Status: AC
  Administered 2020-11-24: 25 ug via INTRAVENOUS
  Filled 2020-11-23: qty 2

## 2020-11-23 MED ORDER — SODIUM CHLORIDE 0.9 % IV SOLN
250.0000 mL | INTRAVENOUS | Status: DC
  Administered 2020-11-23: 250 mL via INTRAVENOUS

## 2020-11-23 MED ORDER — DORZOLAMIDE HCL 2 % OP SOLN
1.0000 [drp] | Freq: Three times a day (TID) | OPHTHALMIC | Status: DC
  Administered 2020-11-24: 1 [drp] via OPHTHALMIC
  Filled 2020-11-23: qty 10

## 2020-11-23 MED ORDER — BRIMONIDINE TARTRATE 0.2 % OP SOLN
1.0000 [drp] | Freq: Three times a day (TID) | OPHTHALMIC | Status: DC
  Administered 2020-11-24: 1 [drp] via OPHTHALMIC
  Filled 2020-11-23: qty 5

## 2020-11-23 MED ORDER — SUCCINYLCHOLINE CHLORIDE 200 MG/10ML IV SOSY
PREFILLED_SYRINGE | INTRAVENOUS | Status: DC | PRN
  Administered 2020-11-23: 120 mg via INTRAVENOUS

## 2020-11-23 MED ORDER — ONDANSETRON HCL 4 MG/2ML IJ SOLN
4.0000 mg | Freq: Once | INTRAMUSCULAR | Status: AC
  Administered 2020-11-23: 4 mg via INTRAVENOUS
  Filled 2020-11-23: qty 2

## 2020-11-23 MED ORDER — SODIUM BICARBONATE 8.4 % IV SOLN
INTRAVENOUS | Status: DC
  Filled 2020-11-23: qty 150
  Filled 2020-11-23 (×2): qty 1000
  Filled 2020-11-23: qty 150

## 2020-11-23 MED ORDER — SODIUM BICARBONATE 8.4 % IV SOLN
INTRAVENOUS | Status: AC
  Administered 2020-11-23: 50 meq via INTRAVENOUS
  Filled 2020-11-23: qty 50

## 2020-11-23 MED ORDER — HYDROMORPHONE HCL 1 MG/ML IJ SOLN
1.0000 mg | Freq: Once | INTRAMUSCULAR | Status: AC
  Administered 2020-11-23: 1 mg via INTRAVENOUS
  Filled 2020-11-23: qty 1

## 2020-11-23 MED ORDER — DOCUSATE SODIUM 100 MG PO CAPS: 100.0000 mg | ORAL_CAPSULE | Freq: Two times a day (BID) | ORAL | Status: DC | PRN

## 2020-11-23 MED ORDER — UMECLIDINIUM BROMIDE 62.5 MCG/ACT IN AEPB
1.0000 | INHALATION_SPRAY | Freq: Every day | RESPIRATORY_TRACT | Status: DC
  Filled 2020-11-23: qty 7

## 2020-11-23 NOTE — ED Provider Notes (Signed)
Pt signed out by Dr. Maryan Rued.    Pt still quite miserable with HR in the 140s.  Additional dilaudid given and pain has improved somewhat.  IVFs given as well.  CT abd/pelvis:  IMPRESSION:  1. No acute aortic syndrome identified. Stable abdominal aortic  aneurysm with previous aorto bi-iliac endograft.  2. Evidence of acute distal ileitis and mild right colitis as  described, new since previous study.  3. Numerous additional findings as above and seen previously.      Due to colitis, pt given IV zosyn.  Due to severe pain, I consulted surgery (Dr. Marcello Moores) to see patient for suspicion of ischemic colitis.  Surgery does not feel that surgery is indicated at this time.  Pressures soft.  Pt given more fluids.  Due to abnormal vitals and the patient's clinical picture; I spoke with CCM (Dr. Carlis Abbott) who will see pt.  She saw pt and will admit to the ICU.  CRITICAL CARE Performed by: Isla Pence   Total critical care time: 30 minutes  Critical care time was exclusive of separately billable procedures and treating other patients.  Critical care was necessary to treat or prevent imminent or life-threatening deterioration.  Critical care was time spent personally by me on the following activities: development of treatment plan with patient and/or surrogate as well as nursing, discussions with consultants, evaluation of patient's response to treatment, examination of patient, obtaining history from patient or surrogate, ordering and performing treatments and interventions, ordering and review of laboratory studies, ordering and review of radiographic studies, pulse oximetry and re-evaluation of patient's condition.        Isla Pence, MD 11/02/2020 [phone]

## 2020-11-23 NOTE — Transfer of Care (Signed)
Immediate Anesthesia Transfer of Care Note  Patient: Riley Sharp  Procedure(s) Performed: AN AD HOC INTUBATION  Patient Location: ICU  Anesthesia Type:General  Level of Consciousness: obtunded  Airway & Oxygen Therapy: Patient re-intubated and Patient placed on Ventilator (see vital sign flow sheet for setting)  Post-op Assessment: Report given to RN and VS being monitored by ICU staff  Post vital signs: Reviewed  Last Vitals:  Vitals Value Taken Time  BP 58/41 11/12/2020 2308  Temp 38.6 C 11/27/2020 2312  Pulse 141 11/26/2020 2309  Resp 16 11/21/2020 2312  SpO2 59 % 11/04/2020 2309  Vitals shown include unvalidated device data.  Last Pain:  Vitals:   11/03/2020 2048  TempSrc: Bladder  PainSc:          Complications: No notable events documented.

## 2020-11-23 NOTE — H&P (Addendum)
NAME:  Riley Sharp, MRN:  [id number], DOB:  Aug 14, 1945, LOS: 0 ADMISSION DATE:  11/26/2020, CONSULTATION DATE:  10/26 REFERRING MD:  Dr. Gilford Raid, CHIEF COMPLAINT:  N/V/D, abdominal pain   History of Present Illness:  75 y/o M who presented 10/26 to [facility] ER with abdominal pain, nausea, vomiting, diarrhea.   He was diagnosed in March 2022 Stage IV Non-Small Cell Lung Cancer, Squamous Cell.  Initial presentation with a RUL, LUL lung mass and loculated right pleural effusion.  PD-L1 expression 0%.  In addition, PET CT showed hypermetabolic areas in the right hilar, paramediastinal lymphadenopathy, small bone lesions on C2, L5, right ribs and right iliac crest.  He was started on palliative radiotherapy, last treatment in 05/2020.  In addition to radiotherapy he was treated with paclitaxel, carboplatin & Beryle Flock.  His last dose of keytruda was 10/19/20.  He had a restaging PET CT on 10/12 which showed evidence of disease progression.  On 10/14 he had 1.1L of cloudy dark brown pleural fluid drained.  He was subsequently started on second line systemic chemotherapy with docetaxel, cyramza and nueulasta support, first dose 11/15/20.  He has known small pericardial effusion.  He was seen in the [facility] on 10/25 for approximately 1 week of increasing fatigue, diarrhea not responsive to imodium, nausea/vomiting not responsive to compazine. During that visit, he also noted increased HR to the 100's despite no exertion.   Last night he developed severe abdominal pain and vomiting. Previously his diarrhea had been painless.  No confusion, rashes, hot swollen joints, coughing, sputum production.  On 10/26, the cancer center called to check on the patient and he was clearly in distress and they instructed him to call 911. He reported severe sudden onset of abdominal pain at the umbilicus.  Initial ER work up showed Na 126, K 4.8, Cl 94, CO2 18, glucose 105, BUN 31 / Sr Cr 1.57, albumin 2.9, AST 43, bilirubin 1.1,  troponin 43, WBC 8.6, Hgb 14.2, platelets clumped.   PCCM called for evaluation.  His significant other Riley Sharp is at bedside with his pastor.  Pertinent  Medical History  HTN HLD AAA CAD s/p MI  Stage IV Non-Small Cell Lung Cancer, Squamous Cell  Significant Hospital Events: Including procedures, antibiotic start and stop dates in addition to other pertinent events   10/26 Presented to [facility] ER with N/V/D, abdominal pain  Interim History / Subjective:    Objective   Blood pressure 106/76, pulse (!) 139, temperature (!) 97.2 F (36.2 C), temperature source Oral, resp. rate (!) 42, height $RemoveBe'5\' 5"'aipeXHcxt$  (1.651 m), weight 64.6 kg, SpO2 99 %.        Intake/Output Summary (Last 24 hours) at 11/06/2020 1904 Last data filed at 11/23/2020 1819 Gross per 24 hour  Intake 50 ml  Output --  Net 50 ml   Filed Weights   11/23/20 1359  Weight: 64.6 kg    Examination: General: Acutely ill-appearing man lying on the stretcher, occasionally groaning in pain HENT: Riley Sharp/AT, dry oral mucosa Lungs: Tachypnea, clear to auscultation bilaterally Cardiovascular: Distant heart sounds, tachycardic, thready radial pulses Abdomen: Hypoactive bowel sounds, diffusely tender to palpation, distended, guarding Extremities: No peripheral edema, no cyanosis Neuro: Sleepy but comprehension appears intact, able to follow commands in all extremities. Derm: Warm, dry, mottled lower extremities to the mid thighs  Bicarb 18 BUN 31 Creatinine 1.57 Lipase 16 AST 43 ALT 18 Bilirubin 1.1  EKG reviewed> low voltage in limb leads, no ischemic changes, sinus tachycardia  Resolved Hospital Problem list     Assessment & Plan:  Sepsis due to colitis, concern for bacteremia from bacterial translocation.  Immunocompromise from recent chemotherapy, receiving Neulasta infusions. Immune-checkpoint inhibitor colitis is also possible. - Broad antibiotics with Zosyn - Aggressive volume resuscitation -stool studies - If he  progresses to shock, vasopressors as required to maintain MAP greater than 65.  Has port for central access. - No surgical or invasive source control required. -IV Dilaudid for pain control -steroids in case this in immune-checkpoint inhibitor related; this would be class IV symptoms  Nausea and vomiting -zofran -volume resuscitation -PPI  AKI, assume prerenal - Aggressive volume resuscitation.  Additional fluid boluses given after sepsis bolus. - Place Foley, strict I's/O - Renally dose meds and avoid nephrotoxic meds  Hyponatremia, assume due to low solute intake and high GI losses recently - Continue to monitor  Stage IV squamous cell lung carcinoma, with mets to bone, pleura, liver.  H/o MPE; last thora 10/15. Small pericardial effusion on CT- concern this is likely metastatic. -Holding all cancer medications at this time. -Echo to assess effusion  Glaucoma - Continue PTA eyedrops  Hypertension - Holding PTA antihypertensives due to concern for hypotension  H/o CAD with MI -hold ASA, Bblocker, statin until abdominal pain and nausea improve -tele monitoring  PAD with aortic aneurysm and previous biiliac bypass, severe celiac trunk narrowing on CT -hold statin and aspirin until PO intake   Mild hyperglycemia -accuchecks, SSI PRN -goal BG <180  I am very concerned that [name] pain predicts that he will have a progressive clinical course, possibly with worsened MOF. I have expressed my concerns to his s/o Riley Sharp and son Riley Sharp [facility]). I discussed code status with [name] and his son, and they both confirmed full code. His son understands my concern for a poor outcome.  Best Practice (right click and "Reselect all SmartList Selections" daily)  Diet/type: NPO DVT prophylaxis: prophylactic heparin  GI prophylaxis: PPI Lines: yes and it is still needed- port Foley:  Yes, and it is still needed Code Status:  full code Last date of multidisciplinary goals of care  discussion [10/26-- confirmed full code by patient and son via phone. ]  Labs   CBC: Recent Labs  Lab 11/22/20 1110 11/20/2020 1408  WBC 1.2* 8.6  NEUTROABS 0.3* PENDING  HGB 13.0 14.2  HCT 38.3* 41.5  MCV 88.0 88.1  PLT 87* PLATELET CLUMPS NOTED ON SMEAR, COUNT APPEARS DECREASED    Basic Metabolic Panel: Recent Labs  Lab 11/22/20 1110 11/28/2020 1408  NA 129* 126*  K 4.4 4.8  CL 98 94*  CO2 22 18*  GLUCOSE 111* 105*  BUN 17 31*  CREATININE 0.84 1.57*  CALCIUM 9.4 8.5*   GFR: Estimated Creatinine Clearance: 34.8 mL/min (A) (by C-G formula based on SCr of 1.57 mg/dL (H)). Recent Labs  Lab 11/22/20 1110 11/21/2020 1408  WBC 1.2* 8.6    Liver Function Tests: Recent Labs  Lab 11/22/20 1110 11/04/2020 1408  AST 41 43*  ALT 14 18  ALKPHOS 80 88  BILITOT 1.0 1.1  PROT 6.3* 6.2*  ALBUMIN 2.8* 2.9*   Recent Labs  Lab 10/29/2020 1408  LIPASE 16   No results for input(s): AMMONIA in the last 168 hours.  ABG    Component Value Date/Time   PHART 7.390 11/09/2015 1455   PCO2ART 34.8 11/09/2015 1455   PO2ART 92.7 11/09/2015 1455   HCO3 20.6 11/09/2015 1455   ACIDBASEDEF 3.5 (H) 11/09/2015 1455  O2SAT 97.1 11/09/2015 1455     Coagulation Profile: No results for input(s): INR, PROTIME in the last 168 hours.  Cardiac Enzymes: No results for input(s): CKTOTAL, CKMB, CKMBINDEX, TROPONINI in the last 168 hours.  HbA1C: No results found for: HGBA1C  CBG: No results for input(s): GLUCAP in the last 168 hours.  Review of Systems:   Limited due to acuity of situation.  Past Medical History:  He,  has a past medical history of AAA (abdominal aortic aneurysm), CAD (coronary artery disease), Cancer ([place]), Glaucoma, Hyperlipidemia, Hypertension, Lung cancer ([place]), Myocardial infarction ([place]) (03/2003), and Wears glasses.   Surgical History:   Past Surgical History:  Procedure Laterality Date   ABDOMINAL AORTIC ENDOVASCULAR STENT GRAFT N/A 11/14/2015   Procedure:  ABDOMINAL AORTIC ENDOVASCULAR STENT GRAFT;  Surgeon: Elam Dutch, MD;  Location: [place];  Service: Vascular;  Laterality: N/A;   APPENDECTOMY     BRONCHIAL BIOPSY  04/18/2020   Procedure: BRONCHIAL BIOPSIES;  Surgeon: [name], DO;  Location: [place] [facility];  Service: Pulmonary;;   BRONCHIAL BRUSHINGS  04/18/2020   Procedure: BRONCHIAL BRUSHINGS;  Surgeon: [name], DO;  Location: [place] [facility];  Service: Pulmonary;;   BRONCHIAL NEEDLE ASPIRATION BIOPSY  04/18/2020   Procedure: BRONCHIAL NEEDLE ASPIRATION BIOPSIES;  Surgeon: [name], DO;  Location: [place] [facility];  Service: Pulmonary;;   BRONCHIAL WASHINGS  04/18/2020   Procedure: BRONCHIAL WASHINGS;  Surgeon: [name], DO;  Location: [place] [facility];  Service: Pulmonary;;   CHOLECYSTECTOMY  2007   Gall Bladder   COLONOSCOPY W/ BIOPSIES AND POLYPECTOMY     EYE SURGERY Bilateral    laser for glaucoma both eyesn x 2   heart stent  Feb. 14, 2005   IR IMAGING GUIDED PORT INSERTION  05/03/2020   THORACENTESIS Left 04/18/2020   Procedure: THORACENTESIS;  Surgeon: [name], DO;  Location: [place];  Service: Pulmonary;  Laterality: Left;   VIDEO BRONCHOSCOPY WITH ENDOBRONCHIAL NAVIGATION Bilateral 04/18/2020   Procedure: VIDEO BRONCHOSCOPY WITH ENDOBRONCHIAL NAVIGATION;  Surgeon: [name], DO;  Location: [place];  Service: Pulmonary;  Laterality: Bilateral;     Social History:   reports that he has quit smoking. His smoking use included cigars and cigarettes. He has a 90.00 pack-year smoking history. He has never used smokeless tobacco. He reports current alcohol use. He reports that he does not use drugs.   Family History:  His family history includes Cancer in his father and another family member; Heart attack in his father and sister; Heart disease in his father, sister, and another family member; Hyperlipidemia in his father and sister; Hypertension in his father and another family member.    Allergies No Known Allergies   Home Medications  Prior to Admission medications   Medication Sig Start Date End Date Taking? Authorizing Provider  albuterol (PROVENTIL) (2.5 MG/3ML) 0.083% nebulizer solution Take 3 mLs (2.5 mg total) by nebulization every 6 (six) hours as needed for wheezing or shortness of breath. 06/13/20   Icard, Octavio Graves, DO  albuterol (VENTOLIN HFA) 108 (90 Base) MCG/ACT inhaler Inhale 2 puffs into the lungs every 6 (six) hours as needed for wheezing or shortness of breath. 06/13/20   [name], DO  aspirin EC 81 MG tablet Take 81 mg by mouth daily.    [provider]  brimonidine (ALPHAGAN) 0.2 % ophthalmic solution Place 1 drop into both eyes 3 (three) times daily.    [provider]  dexamethasone (DECADRON) 4  MG tablet Take 2 tablets (8 mg total) by mouth 2 (two) times daily with a meal. Decadron 8 mg p.o. twice daily the day before, day of and day after the chemotherapy every 3 weeks. 11/14/20   Curt Bears, MD  dorzolamide (TRUSOPT) 2 % ophthalmic solution Place 1 drop into both eyes 3 (three) times daily.    [provider]  gabapentin (NEURONTIN) 100 MG capsule TAKE 1 CAPSULE BY MOUTH TWICE A DAY 11/21/20   Curt Bears, MD  latanoprost (XALATAN) 0.005 % ophthalmic solution Place 1 drop into both eyes at bedtime.    [provider]  lidocaine-prilocaine (EMLA) cream Apply 1 application topically as needed. 04/26/20   Heilingoetter, Cassandra L, PA-C  loratadine (CLARITIN) 10 MG tablet Take 10 mg by mouth daily.    [provider]  metoprolol succinate (TOPROL-XL) 25 MG 24 hr tablet Take 25 mg by mouth daily.    [provider]  ondansetron (ZOFRAN) 8 MG tablet Take 1 tablet (8 mg total) by mouth every 8 (eight) hours as needed. 11/18/20   Curt Bears, MD  prochlorperazine (COMPAZINE) 10 MG tablet Take 1 tablet (10 mg total) by mouth every 6 (six) hours as needed. 04/26/20   Heilingoetter,  Cassandra L, PA-C  ramipril (ALTACE) 5 MG tablet Take 5 mg by mouth daily.    [provider]  rosuvastatin (CRESTOR) 10 MG tablet Take 10 mg by mouth daily.    [provider]  timolol (TIMOPTIC) 0.5 % ophthalmic solution Place 1 drop into both eyes daily. 02/23/20   [provider]  Tiotropium Bromide-Olodaterol 2.5-2.5 MCG/ACT AERS Inhale 2 puffs into the lungs daily. Patient not taking: Reported on 11/09/2020 06/13/20   [name], DO     Critical care time: 45 min.    Julian Hy, DO 11/09/2020 7:04 PM [facility]

## 2020-11-23 NOTE — Progress Notes (Addendum)
[facility] Progress Note Patient Name: Riley Sharp DOB: March 29, 1945 MRN: [id number]   Date of Service  11/07/2020  HPI/Events of Note  Patient has just arrived to the ICU. Reviewed notes from PCCM, surgery and other teams. He is on 10 mic/min levophed infusion, remains tachycardic and is difficult to obtain a pulse ox wave as his extremities are mottled, per RN. He is awake and on a NRB mask since a waveform could not be obtained.   eICU Interventions  Stat ABG, lactate and BMP ordered. Last LA was 6.9. Abdominal exam unchanged.  Has been given over 5 liter fluids  CXR to check if he has volume overload Based on this, will adjust and add fluids as needed/permitted Changed the order of levophed to allow titration since the port is being used for infusion Overall very ill. May end up needing intubation and likely will not do well with his underlying condition.      Intervention Category Major Interventions: Acid-Base disturbance - evaluation and management;Sepsis - evaluation and management Evaluation Type: New Patient Evaluation  Margaretmary Lombard 11/05/2020, 8:54 PM  Addendum at 10:45 pm  Labs noted Seen once again on camera Patient looks very tachypneic now and is severely short of breath, and less responsive as well Levophed is at 4 mic/min at this time but his overall condition has declined He needs to be intubated I discussed with the patient's son and his partner who are in the room, both are aware he is severely ill and may not make it but say that he did want to give it one best attempt and are willing to try intubation Orders placed to intubate and for arterial line I also ordered a bicarb push and a bicarb drip since he has worsening AKI and probably bicarb loss from GI tract as well Vent settings, post intubation ABG, CXR and sedation also ordered Of note, LA is down to 6.6 from 6.9 - continue to trend  Addendum at 11 pm  Hypotension - increasing levophed, LR  bolus, add vasopressin Art line is being placed as well  Addendum 12:30 am Notified that RT and CRNA unable to place art line High grade fevers, worsening shock Adding vancomycin and anti fungal as well Difficult to obtain Bps and waveforms and has a port and one PIV  Asked RN to place additional IV lines if possible  He is on levophed, vasopressin, and requires a bicarb drip, sedation and 3 antibiotics Have reached out to ground team for placing vascular lines since bedside unable to place art line/more iV at this time   Addendum  Reviewed post intubation CXR Also ABG and LA resulted, not surprising with worsening shock  I had already added anti microbials and will push 2 more amps of bicarb, increase bicarb drip to 150 cc/hour Lower Fio2 to 80% Repeat ABG and LA at 4 am   Addendum at 3:55 am ABG noted Lower the Fio2 to 60% Push 1 more amp bicarb Other labs pending   Addendum 6:45 am  Unfortunately patient had a cardiac arrest 10 min ago, ROSC in 15 min Got multiple rounds of bicarb and continued on massive doses of pressors Called and spoke with the son Events discussed, and strongly suggest DNR at this point since he has been on maximum therapy and continues to decline Stage 4 cancer along with multi organ failure - prognosis is grave Son is in agreement with DNR and wants to discuss with the patient's partner who had  just stepped out Also updated Dr Loletta Specter on overnight events, she will be in shortly I answered the questions that the son had, and at the end of our talk, he did not have more doubts at this time

## 2020-11-23 NOTE — ED Triage Notes (Signed)
Pt BIBA. Per ems, Pt presents with c/o abdominal pain,SOB and diarrhea. Pt reports he took kaopectate for the diarrhea and that's when abdominal pain began. Pt also reports giving himself an albuterol tx with no relief. Denies any chest pain. Hx cancer  151/76 HR 70 97% on non rebreather.  [place].

## 2020-11-23 NOTE — ED Provider Notes (Signed)
[facility] DEPT Provider Note   CSN: [id number] Arrival date & time: 11/14/2020  1342     History Chief Complaint  Patient presents with   Respiratory Distress   Abdominal Pain    Riley Sharp is a 75 y.o. male.  Patient is a 75 year old male with a history of AAA status post stenting with fempop bypass, lung cancer currently under palliative care with ongoing chemotherapy and recent thoracentesis with 1.5 L of fluid removed last week who is presenting today with sudden onset of severe lower abdominal pain.  Patient reports that he had 2 episodes of emesis early this morning and he suddenly developed severe abdominal pain early this morning around and underneath his umbilicus.  The pain is 10 out of 10 and feels like a knife stabbing him.  It does not radiate.  Nothing seems to make it better.  He had been having significant diarrhea over the last week and had been taking Imodium last bowel movement was yesterday.  He is also complained of feeling short of breath for weeks now seems to be worse today.  He has had a productive cough but denies any fever.  Other than his aortic repair he has had no other abdominal surgeries.  He only takes Tylenol at home but reports that was not helping his pain.  He does not wear oxygen at home but has also felt very short of breath this morning.  The history is provided by the patient and medical records.  Abdominal Pain     Past Medical History:  Diagnosis Date   AAA (abdominal aortic aneurysm)    CAD (coronary artery disease)    Cancer (HCC)    basal cell carcimona right ear and upper left at shoulder   Glaucoma    Hyperlipidemia    Hypertension    Myocardial infarction [facility]) 03/2003   Wears glasses     Patient Active Problem List   Diagnosis Date Noted   Allergic rhinitis 06/13/2020   Benign prostatic hyperplasia 06/13/2020   Macrocytosis 06/13/2020   Malignant neoplasm of lung ([place]) 06/13/2020   Other  specified diseases of blood and blood-forming organs 06/13/2020   Personal history of colonic polyps 06/13/2020   Sciatica 06/13/2020   Secondary malignant neoplasm of left lung ([place]) 06/13/2020   Secondary malignant neoplasm of right lung ([place]) 06/13/2020   Tobacco user 06/13/2020   Hemoptysis 05/25/2020   Port-A-Cath in place 05/18/2020   Squamous cell carcinoma of lung, stage IV, right ([place]) 04/26/2020   Goals of care, counseling/discussion 04/26/2020   Encounter for antineoplastic chemotherapy 04/26/2020   Encounter for antineoplastic immunotherapy 04/26/2020   Pleural effusion    Lung mass 04/07/2020   Hypercholesterolemia 11/09/2016   AAA (abdominal aortic aneurysm) without rupture 11/14/2015   Atherosclerotic heart disease of native coronary artery without angina pectoris 11/24/2014   Low-tension glaucoma of left eye, mild stage 01/15/2014   Low-tension glaucoma of right eye, moderate stage 01/15/2014   Abdominal aneurysm without mention of rupture 02/05/2013   Bilateral dry eyes 01/09/2013   Nuclear sclerotic cataract of both eyes 01/09/2013   Pain in limb 02/04/2012   AAA (abdominal aortic aneurysm) 11/23/2010    Past Surgical History:  Procedure Laterality Date   ABDOMINAL AORTIC ENDOVASCULAR STENT GRAFT N/A 11/14/2015   Procedure: ABDOMINAL AORTIC ENDOVASCULAR STENT GRAFT;  Surgeon: Elam Dutch, MD;  Location: [place];  Service: Vascular;  Laterality: N/A;   APPENDECTOMY     BRONCHIAL BIOPSY  04/18/2020  Procedure: BRONCHIAL BIOPSIES;  Surgeon: [name], DO;  Location: [facility];  Service: Pulmonary;;   BRONCHIAL BRUSHINGS  04/18/2020   Procedure: BRONCHIAL BRUSHINGS;  Surgeon: [name], DO;  Location: [place] [facility];  Service: Pulmonary;;   BRONCHIAL NEEDLE ASPIRATION BIOPSY  04/18/2020   Procedure: BRONCHIAL NEEDLE ASPIRATION BIOPSIES;  Surgeon: [name], DO;  Location: [facility];  Service: Pulmonary;;   BRONCHIAL WASHINGS  04/18/2020    Procedure: BRONCHIAL WASHINGS;  Surgeon: [name], DO;  Location: [facility];  Service: Pulmonary;;   CHOLECYSTECTOMY  2007   Gall Bladder   COLONOSCOPY W/ BIOPSIES AND POLYPECTOMY     EYE SURGERY Bilateral    laser for glaucoma both eyesn x 2   heart stent  Feb. 14, 2005   IR IMAGING GUIDED PORT INSERTION  05/03/2020   THORACENTESIS Left 04/18/2020   Procedure: THORACENTESIS;  Surgeon: [name], DO;  Location: [facility];  Service: Pulmonary;  Laterality: Left;   VIDEO BRONCHOSCOPY WITH ENDOBRONCHIAL NAVIGATION Bilateral 04/18/2020   Procedure: VIDEO BRONCHOSCOPY WITH ENDOBRONCHIAL NAVIGATION;  Surgeon: [name], DO;  Location: [facility];  Service: Pulmonary;  Laterality: Bilateral;       Family History  Problem Relation Age of Onset   Cancer Father    Heart disease Father        After age 10   Hyperlipidemia Father    Hypertension Father    Heart attack Father    Heart disease Sister        After age 63   Hyperlipidemia Sister    Heart attack Sister    Hypertension Other    Heart disease Other    Cancer Other     Social History   Tobacco Use   Smoking status: Former    Packs/day: 2.00    Years: 45.00    Pack years: 90.00    Types: Cigars, Cigarettes   Smokeless tobacco: Never   Tobacco comments:    2-3 cigars per day--quit Jan 2022  Vaping Use   Vaping Use: Never used  Substance Use Topics   Alcohol use: Yes    Alcohol/week: 0.0 standard drinks    Comment: rare   Drug use: No    Home Medications Prior to Admission medications   Medication Sig Start Date End Date Taking? Authorizing Provider  albuterol (PROVENTIL) (2.5 MG/3ML) 0.083% nebulizer solution Take 3 mLs (2.5 mg total) by nebulization every 6 (six) hours as needed for wheezing or shortness of breath. 06/13/20   Icard, Octavio Graves, DO  albuterol (VENTOLIN HFA) 108 (90 Base) MCG/ACT inhaler Inhale 2 puffs into the lungs every 6 (six) hours as needed for wheezing or shortness of  breath. 06/13/20   [name], DO  aspirin EC 81 MG tablet Take 81 mg by mouth daily.    [provider]  brimonidine (ALPHAGAN) 0.2 % ophthalmic solution Place 1 drop into both eyes 3 (three) times daily.    [provider]  dexamethasone (DECADRON) 4 MG tablet Take 2 tablets (8 mg total) by mouth 2 (two) times daily with a meal. Decadron 8 mg p.o. twice daily the day before, day of and day after the chemotherapy every 3 weeks. 11/14/20   Curt Bears, MD  dorzolamide (TRUSOPT) 2 % ophthalmic solution Place 1 drop into both eyes 3 (three) times daily.    [provider]  gabapentin (NEURONTIN) 100 MG capsule TAKE 1 CAPSULE BY MOUTH TWICE A DAY 11/21/20   Mohamed,  Mohamed, MD  latanoprost (XALATAN) 0.005 % ophthalmic solution Place 1 drop into both eyes at bedtime.    [provider]  lidocaine-prilocaine (EMLA) cream Apply 1 application topically as needed. 04/26/20   Heilingoetter, Cassandra L, PA-C  loratadine (CLARITIN) 10 MG tablet Take 10 mg by mouth daily.    [provider]  metoprolol succinate (TOPROL-XL) 25 MG 24 hr tablet Take 25 mg by mouth daily.    [provider]  ondansetron (ZOFRAN) 8 MG tablet Take 1 tablet (8 mg total) by mouth every 8 (eight) hours as needed. 11/18/20   Curt Bears, MD  prochlorperazine (COMPAZINE) 10 MG tablet Take 1 tablet (10 mg total) by mouth every 6 (six) hours as needed. 04/26/20   Heilingoetter, Cassandra L, PA-C  ramipril (ALTACE) 5 MG tablet Take 5 mg by mouth daily.    [provider]  rosuvastatin (CRESTOR) 10 MG tablet Take 10 mg by mouth daily.    [provider]  timolol (TIMOPTIC) 0.5 % ophthalmic solution Place 1 drop into both eyes daily. 02/23/20   [provider]  Tiotropium Bromide-Olodaterol 2.5-2.5 MCG/ACT AERS Inhale 2 puffs into the lungs daily. Patient not taking: Reported on 11/09/2020 06/13/20   [name], DO    Allergies    Patient  has no known allergies.  Review of Systems   Review of Systems  Gastrointestinal:  Positive for abdominal pain.  All other systems reviewed and are negative.  Physical Exam Updated Vital Signs BP 100/71 (BP Location: Right Arm)   Temp (!) 97.2 F (36.2 C) (Oral)   Resp (!) 35   Ht 5\' 5"  (1.651 m)   Wt 64.6 kg   SpO2 91%   BMI 23.71 kg/m   Physical Exam Vitals and nursing note reviewed.  Constitutional:      General: He is in acute distress.     Appearance: He is well-developed. He is ill-appearing.  HENT:     Head: Normocephalic and atraumatic.     Mouth/Throat:     Mouth: Mucous membranes are dry.  Eyes:     Conjunctiva/sclera: Conjunctivae normal.     Pupils: Pupils are equal, round, and reactive to light.  Cardiovascular:     Rate and Rhythm: Regular rhythm. Tachycardia present.     Heart sounds: No murmur heard. Pulmonary:     Effort: Pulmonary effort is normal. Tachypnea present. No respiratory distress.     Breath sounds: Normal breath sounds. Decreased air movement present. No wheezing or rales.     Comments: Power port present in the right upper chest Abdominal:     General: Abdomen is flat. Bowel sounds are decreased. There is no distension.     Palpations: Abdomen is soft.     Tenderness: There is abdominal tenderness in the right lower quadrant, periumbilical area, suprapubic area and left lower quadrant. There is no right CVA tenderness, left CVA tenderness, guarding or rebound.     Hernia: No hernia is present.  Musculoskeletal:        General: No tenderness. Normal range of motion.     Cervical back: Normal range of motion and neck supple.  Skin:    General: Skin is warm and dry.     Coloration: Skin is pale.     Findings: No erythema or rash.     Comments: Slight ducks canis in bilateral toes and feet bilaterally are cold.  Thready pulse noted in the PT bilaterally.  Sensation intact  Neurological:  Mental Status: He is alert and oriented to  person, place, and time. Mental status is at baseline.     Sensory: No sensory deficit.     Motor: No weakness.  Psychiatric:        Mood and Affect: Mood normal.        Behavior: Behavior normal.    ED Results / Procedures / Treatments   Labs (all labs ordered are listed, but only abnormal results are displayed) Labs Reviewed  URINALYSIS, ROUTINE W REFLEX MICROSCOPIC  CBC WITH DIFFERENTIAL/PLATELET  COMPREHENSIVE METABOLIC PANEL  LIPASE, BLOOD  TYPE AND SCREEN  TROPONIN I (HIGH SENSITIVITY)    EKG EKG Interpretation  Date/Time:  Wednesday November 23 2020 14:17:36 EDT Ventricular Rate:  144 PR Interval:  151 QRS Duration: 82 QT Interval:  311 QTC Calculation: 482 R Axis:   23 Text Interpretation: Sinus tachycardia Anterior infarct, old Nonspecific T abnormalities, lateral leads No significant change since last tracing Confirmed by Blanchie Dessert [phone]) on 11/18/2020 2:33:17 PM  Radiology No results found.  Procedures Procedures   Medications Ordered in ED Medications  morphine 4 MG/ML injection 4 mg (4 mg Intravenous Given 11/01/2020 1413)  ondansetron (ZOFRAN) injection 4 mg (4 mg Intravenous Given 10/30/2020 1413)  lactated ringers bolus 1,000 mL (1,000 mLs Intravenous Bolus 11/26/2020 1432)  morphine 4 MG/ML injection 4 mg (4 mg Intravenous Given 11/27/2020 1430)    ED Course  I have reviewed the triage vital signs and the nursing notes.  Pertinent labs & imaging results that were available during my care of the patient were reviewed by me and considered in my medical decision making (see chart for details).    MDM Rules/Calculators/A&P                           Patient is a 75 year old ill-appearing male presenting today with sudden onset of severe abdominal pain, tachycardia.  Patient looks unwell on exam and appears to be in a lot of pain.  His feet bilaterally are slightly dusky but a thready pulse is noted in the PT area bilaterally.  Patient does have  prior history of AAA that has undergone repair.  Patient's pain on exam is around the umbilicus and below.  He has been having diarrhea for some time now since starting a new chemotherapy medication 1 week ago.  He denies any blood in his stool but does report that he takes anticoagulation.  Concern for possible mesenteric ischemia versus acute graft thrombosis or dissection.  Also concern for possibility for bowel obstruction or perforation.  Patient is also complaining of being short of breath and is recently had thoracentesis with 1.5 L of fluid removed.  He also has known pericardial effusion but that is currently being watched.  Patient has sinus tachycardia on his EKG today with blood pressures in the low 100s.  Low suspicion for ACS.  Not displaying significant evidence of tamponade at this time.  Patient given nasal cannula oxygen but he reports has not been helping.  Also concern for possible infectious etiology, reaccumulation of pleural effusion related to the cancer.  CTA of the chest abdomen pelvis ordered to evaluate for ongoing vascular issue.  Patient given morphine for pain.  No fever at this time or noted at home.  Patient was noted to be neutropenic and did receive Neulasta 1 week ago.  He denies any urinary symptoms.  Did discuss with patient and he is a full code at  this time.  Final Clinical Impression(s) / ED Diagnoses Final diagnoses:  None    Rx / DC Orders ED Discharge Orders     None        Blanchie Dessert, MD 11/01/2020 1501

## 2020-11-23 NOTE — Progress Notes (Signed)
Pharmacy Antibiotic Note  Riley Sharp is a 75 y.o. male admitted on 11/10/2020 with intra-abdominal infection.  Pharmacy has been consulted for zosyn dosing.  Plan: Zosyn 3.375g IV q8h (4 hour infusion). Follow up renal function & cultures  Height: 5\' 5"  (165.1 cm) Weight: 64.6 kg (142 lb 8 oz) IBW/kg (Calculated) : 61.5  Temp (24hrs), Avg:97.2 F (36.2 C), Min:97.2 F (36.2 C), Max:97.2 F (36.2 C)  Recent Labs  Lab 11/22/20 1110 11/09/2020 1408  WBC 1.2* 8.6  CREATININE 0.84 1.57*    Estimated Creatinine Clearance: 34.8 mL/min (A) (by C-G formula based on SCr of 1.57 mg/dL (H)).    No Known Allergies  Antimicrobials this admission: 10/26 Zosyn >>  Dose adjustments this admission:  Microbiology results: 10/26 COVID: neg 10/26 Influenza: neg 10/26 BCx:  Thank you for allowing pharmacy to be a part of this patient's care.  Peggyann Juba, PharmD, BCPS Pharmacy: [phone] 11/22/2020 6:47 PM

## 2020-11-23 NOTE — ED Notes (Signed)
ED TO INPATIENT HANDOFF REPORT  Name/Age/Gender Riley Sharp 75 y.o. male  Code Status    Code Status Orders  (From admission, onward)           Start     Ordered   11/02/2020 1837  Full code  Continuous        10/29/2020 1838           Code Status History     Date Active Date Inactive Code Status Order ID Comments User Context   11/14/2015 1505 11/15/2015 1658 Full Code [id number]  Gabriel Earing, PA-C Inpatient      Advance Directive Documentation    Flowsheet Row Most Recent Value  Type of Advance Directive Healthcare Power of Attorney  Pre-existing out of facility DNR order (yellow form or pink MOST form) --  "MOST" Form in Place? --       Home/SNF/Other Home  Chief Complaint Sepsis ([place]) [A41.9]  Level of Care/Admitting Diagnosis ED Disposition     ED Disposition  Admit   Condition  --   [place]: [facility] [[id number]]  Level of Care: ICU [6]  May admit patient to [facility] or [facility] if equivalent level of care is available:: Yes  Covid Evaluation: Asymptomatic Screening Protocol (No Symptoms)  Diagnosis: Sepsis [facility]) [[id number]]  Admitting Physician: Julian Hy [[id number]]  Attending Physician: Julian Hy [[id number]]  Estimated length of stay: 3 - 4 days  Certification:: I certify this patient will need inpatient services for at least 2 midnights          Medical History Past Medical History:  Diagnosis Date   AAA (abdominal aortic aneurysm)    CAD (coronary artery disease)    Cancer ([place])    basal cell carcimona right ear and upper left at shoulder   Glaucoma    Hyperlipidemia    Hypertension    Lung cancer ([place])    squamous cell carcinoma   Myocardial infarction ([place]) 03/2003   Wears glasses     Allergies No Known Allergies  IV Location/Drains/Wounds Patient Lines/Drains/Airways Status     Active Line/Drains/Airways     Name Placement date Placement time Site Days    Implanted Port 05/03/20 Right Chest 05/03/20  --  Chest  204   Peripheral IV 11/28/2020 20 G Left Antecubital 11/25/2020  1519  Antecubital  less than 1   Incision (Closed) 11/14/15 Groin Right 11/14/15  0950  -- 1836   Incision (Closed) 11/14/15 Groin Left 11/14/15  0950  -- 1836   Incision (Closed) 05/03/20 Chest Right;Upper 05/03/20  0930  -- 204            Labs/Imaging Results for orders placed or performed during the hospital encounter of 11/03/2020 (from the past 48 hour(s))  CBC with Differential/Platelet     Status: Abnormal (Preliminary result)   Collection Time: 11/22/2020  2:08 PM  Result Value Ref Range   WBC 8.6 4.0 - 10.5 K/uL   RBC 4.71 4.22 - 5.81 MIL/uL   Hemoglobin 14.2 13.0 - 17.0 g/dL   HCT 41.5 39.0 - 52.0 %   MCV 88.1 80.0 - 100.0 fL   MCH 30.1 26.0 - 34.0 pg   MCHC 34.2 30.0 - 36.0 g/dL   RDW 13.5 11.5 - 15.5 %   Platelets  150 - 400 K/uL    PLATELET CLUMPS NOTED ON SMEAR, COUNT APPEARS DECREASED   nRBC 1.3 (H) 0.0 - 0.2 %  Comment: Performed at [facility], [place] [address] [place], [place] [address]   Neutrophils Relative % PENDING %   Neutro Abs PENDING 1.7 - 7.7 K/uL   Band Neutrophils PENDING %   Lymphocytes Relative PENDING %   Lymphs Abs PENDING 0.7 - 4.0 K/uL   Monocytes Relative PENDING %   Monocytes Absolute PENDING 0.1 - 1.0 K/uL   Eosinophils Relative PENDING %   Eosinophils Absolute PENDING 0.0 - 0.5 K/uL   Basophils Relative PENDING %   Basophils Absolute PENDING 0.0 - 0.1 K/uL   WBC Morphology PENDING    RBC Morphology PENDING    Smear Review PENDING    Other PENDING %   nRBC PENDING 0 /100 WBC   Metamyelocytes Relative PENDING %   Myelocytes PENDING %   Promyelocytes Relative PENDING %   Blasts PENDING %   Immature Granulocytes PENDING %   Abs Immature Granulocytes PENDING 0.00 - 0.07 K/uL  Comprehensive metabolic panel     Status: Abnormal   Collection Time: 10/29/2020  2:08 PM  Result Value Ref Range   Sodium  126 (L) 135 - 145 mmol/L   Potassium 4.8 3.5 - 5.1 mmol/L   Chloride 94 (L) 98 - 111 mmol/L   CO2 18 (L) 22 - 32 mmol/L   Glucose, Bld 105 (H) 70 - 99 mg/dL    Comment: Glucose reference range applies only to samples taken after fasting for at least 8 hours.   BUN 31 (H) 8 - 23 mg/dL   Creatinine, Ser 1.57 (H) 0.61 - 1.24 mg/dL   Calcium 8.5 (L) 8.9 - 10.3 mg/dL   Total Protein 6.2 (L) 6.5 - 8.1 g/dL   Albumin 2.9 (L) 3.5 - 5.0 g/dL   AST 43 (H) 15 - 41 U/L   ALT 18 0 - 44 U/L   Alkaline Phosphatase 88 38 - 126 U/L   Total Bilirubin 1.1 0.3 - 1.2 mg/dL   GFR, Estimated 45 (L) >60 mL/min    Comment: (NOTE) Calculated using the CKD-EPI Creatinine Equation (2021)    Anion gap 14 5 - 15    Comment: Performed at [facility], [place] [address], [place], [place] [address]  Lipase, blood     Status: None   Collection Time: 11/17/2020  2:08 PM  Result Value Ref Range   Lipase 16 11 - 51 U/L    Comment: Performed at [facility], [place] [address], [place], [place] [address]  Troponin I (High Sensitivity)     Status: Abnormal   Collection Time: 11/03/2020  2:08 PM  Result Value Ref Range   Troponin I (High Sensitivity) 43 (H) <18 ng/L    Comment: (NOTE) Elevated high sensitivity troponin I (hsTnI) values and significant  changes across serial measurements may suggest ACS but many other  chronic and acute conditions are known to elevate hsTnI results.  Refer to the "Links" section for chest pain algorithms and additional  guidance. Performed at [facility], [place] [address], [place], [place] [address]   Type and screen     Status: None   Collection Time: 11/07/2020  2:15 PM  Result Value Ref Range   ABO/RH(D) O POS    Antibody Screen NEG    Sample Expiration      11/26/2020,2359 Performed at [place] [address] [place], [place] [address]   Troponin I (High Sensitivity)     Status: Abnormal   Collection  Time:  11/17/2020  4:12 PM  Result Value Ref Range   Troponin I (High Sensitivity) 44 (H) <18 ng/L    Comment: (NOTE) Elevated high sensitivity troponin I (hsTnI) values and significant  changes across serial measurements may suggest ACS but many other  chronic and acute conditions are known to elevate hsTnI results.  Refer to the "Links" section for chest pain algorithms and additional  guidance. Performed at [facility], [place] [address], [place], [place] [address]   Resp Panel by RT-PCR (Flu A&B, Covid) Nasopharyngeal Swab     Status: None   Collection Time: 11/28/2020  5:43 PM   Specimen: Nasopharyngeal Swab; Nasopharyngeal(NP) swabs in vial transport medium  Result Value Ref Range   SARS Coronavirus 2 by RT PCR NEGATIVE NEGATIVE    Comment: (NOTE) SARS-CoV-2 target nucleic acids are NOT DETECTED.  The SARS-CoV-2 RNA is generally detectable in upper respiratory specimens during the acute phase of infection. The lowest concentration of SARS-CoV-2 viral copies this assay can detect is 138 copies/mL. A negative result does not preclude SARS-Cov-2 infection and should not be used as the sole basis for treatment or other patient management decisions. A negative result may occur with  improper specimen collection/handling, submission of specimen other than nasopharyngeal swab, presence of viral mutation(s) within the areas targeted by this assay, and inadequate number of viral copies(<138 copies/mL). A negative result must be combined with clinical observations, patient history, and epidemiological information. The expected result is Negative.  Fact Sheet for Patients:  EntrepreneurPulse.com.au  Fact Sheet for Healthcare Providers:  IncredibleEmployment.be  This test is no t yet approved or cleared by the [place] FDA and  has been authorized for detection and/or diagnosis of SARS-CoV-2 by FDA under an Emergency Use  Authorization (EUA). This EUA will remain  in effect (meaning this test can be used) for the duration of the COVID-19 declaration under Section 564(b)(1) of the Act, 21 U.S.C.section 360bbb-3(b)(1), unless the authorization is terminated  or revoked sooner.       Influenza A by PCR NEGATIVE NEGATIVE   Influenza B by PCR NEGATIVE NEGATIVE    Comment: (NOTE) The Xpert Xpress SARS-CoV-2/FLU/RSV plus assay is intended as an aid in the diagnosis of influenza from Nasopharyngeal swab specimens and should not be used as a sole basis for treatment. Nasal washings and aspirates are unacceptable for Xpert Xpress SARS-CoV-2/FLU/RSV testing.  Fact Sheet for Patients: EntrepreneurPulse.com.au  Fact Sheet for Healthcare Providers: IncredibleEmployment.be  This test is not yet approved or cleared by the [place] FDA and has been authorized for detection and/or diagnosis of SARS-CoV-2 by FDA under an Emergency Use Authorization (EUA). This EUA will remain in effect (meaning this test can be used) for the duration of the COVID-19 declaration under Section 564(b)(1) of the Act, 21 U.S.C. section 360bbb-3(b)(1), unless the authorization is terminated or revoked.  Performed at [facility], [place] [address], [place], [place] [address]    DG Chest Port 1 View  Result Date: 11/16/2020 CLINICAL DATA:  Shortness of breath EXAM: PORTABLE CHEST 1 VIEW COMPARISON:  11/11/2020 FINDINGS: Power port remains in place with the tip at the SVC RA junction. The right chest shows hazy opacity in the upper lobe consistent with chronic scarring. This could be slightly more prominent and a mild right upper lobe pneumonia is not excluded. Persistent chronic pleural density on the left with abnormal density of the adjacent left lung, possibly with a slight increase in pleural density. Otherwise no change.  IMPRESSION: Question slight increase in chronic pleural  density on the left. Question slight increase in hazy pulmonary opacity in the right upper lobe which could indicate mild pneumonia. Electronically Signed   By: Nelson Chimes M.D.   On: 11/12/2020 14:59   CT Angio Chest/Abd/Pel for Dissection W and/or Wo Contrast  Result Date: 11/06/2020 CLINICAL DATA:  Chest pain.  Aortic dissection suspected. EXAM: CT ANGIOGRAPHY CHEST, ABDOMEN AND PELVIS TECHNIQUE: Non-contrast CT of the chest was initially obtained. Multidetector CT imaging through the chest, abdomen and pelvis was performed using the standard protocol during bolus administration of intravenous contrast. Multiplanar reconstructed images and MIPs were obtained and reviewed to evaluate the vascular anatomy. CONTRAST:  54mL OMNIPAQUE IOHEXOL 350 MG/ML SOLN COMPARISON:  CT chest, abdomen and pelvis 09/05/2020 FINDINGS: CTA CHEST FINDINGS Cardiovascular: Heart size is normal. Small pericardial effusion. Extensive coronary artery calcifications. Main pulmonary artery is normal caliber. Thoracic aorta is tortuous and within normal limits caliber. No intramural hematoma or dissection identified. Moderate to severe atherosclerotic plaques. Mediastinum/Nodes: No bulky lymphadenopathy visualized. A few small calcified lymph nodes are again seen. Stomach is mildly distended and fluid-filled with mild wall thickening throughout. Lungs/Pleura: Large loculated left-sided pleural effusion with associated compressive atelectatic changes. The effusion is mildly decreased in size since previous study with improved aeration of the lower lobe. Similar size and appearance of bilateral masslike consolidative densities in the bilateral upper lobes. Musculoskeletal: Redemonstration of scattered lytic lesions in the ribs as well as the T1 and T7 vertebral bodies. Review of the MIP images confirms the above findings. CTA ABDOMEN AND PELVIS FINDINGS VASCULAR Aorta: Infrarenal abdominal aortic aneurysm again seen with previous surgical  changes of aorto bi-iliac bypass graft. The native lumen of the inferior abdominal aorta measures up to 5.4 cm in diameter. No aortic dissection visualized. Celiac: Severe narrowing at the origin of the celiac trunk. SMA: Patent with mild narrowing at the origin. Renals: Patent bilaterally with moderate narrowing on the right and mild narrowing on the left. IMA: Not visualized. Inflow: Dilatation of the distal left common iliac artery measuring up to 1.8 cm in diameter. Bilateral external iliac arteries and visualized femoral arteries are patent. Veins: Grossly unremarkable. Review of the MIP images confirms the above findings. NON-VASCULAR Hepatobiliary: Liver is normal in size. Grossly similar appearance of several hypodense hepatic metastases, given limited evaluation due to phase of contrast. Gallbladder surgically absent. No biliary ductal dilatation appreciated. Pancreas: No mass or ductal dilatation identified. Spleen: Normal in size without focal abnormality. Adrenals/Urinary Tract: Adrenal glands appear normal. No new mass or hydronephrosis identified bilaterally. Areas of scarring most prominent in the lower right kidney. Urinary bladder appears grossly normal. Stomach/Bowel: Small hiatal hernia. No bowel obstruction, free air or pneumatosis. Segment of distal/terminal ileum demonstrates diffuse wall thickening/edema and adjacent fat stranding in the mesentery. Mild wall thickening of the cecum and proximal ascending colon with pericolic fat stranding. Mild colonic diverticulosis. Lymphatic: No bulky lymphadenopathy identified. Reproductive: Prostate gland upper normal size. Other: Trace ascites. Musculoskeletal: Stable appearing lytic lesion in the right iliac bone and in the L5 and S1 vertebral bodies. Review of the MIP images confirms the above findings. IMPRESSION: 1. No acute aortic syndrome identified. Stable abdominal aortic aneurysm with previous aorto bi-iliac endograft. 2. Evidence of acute  distal ileitis and mild right colitis as described, new since previous study. 3. Numerous additional findings as above and seen previously. Electronically Signed   By: Ofilia Neas M.D.   On: 11/19/2020 16:39  Pending Labs Unresulted Labs (From admission, onward)     Start     Ordered   December 21, 2020 0500  CBC  Tomorrow morning,   R        11/25/2020 1838   12-21-20 0500  Magnesium  Tomorrow morning,   R        11/21/2020 1838   2020/12/21 0500  Phosphorus  Tomorrow morning,   R        11/14/2020 1838   2020/12/21 0500  Comprehensive metabolic panel  Daily,   R      10/30/2020 1838   11/04/2020 1924  Lactoferrin, Fecal, Qualitative  Once,   R        11/27/2020 1923   11/04/2020 1924  OVA + PARASITE EXAM  Once,   R        11/22/2020 1923   11/26/2020 1907  Hemoglobin A1c  Add-on,   AD       Comments: To assess prior glycemic control    11/22/2020 1906   10/30/2020 1726  Culture, blood (routine x 2)  BLOOD CULTURE X 2,   STAT      11/03/2020 1725   11/21/2020 1723  Lactic acid, plasma  Now then every 2 hours,   STAT      10/29/2020 1722   11/13/2020 1408  Urinalysis, Routine w reflex microscopic  Once,   STAT        10/31/2020 1407            Vitals/Pain Today's Vitals   11/20/2020 1825 11/18/2020 1830 11/27/2020 1848 11/23/2020 1900  BP: 115/66 (!) 114/37 106/76 104/67  Pulse: (!) 137 (!) 138 (!) 139 (!) 139  Resp: (!) 41 (!) 35 (!) 42 (!) 39  Temp:      TempSrc:      SpO2: 100% 100% 99% 100%  Weight:      Height:      PainSc:        Isolation Precautions No active isolations  Medications Medications  docusate sodium (COLACE) capsule 100 mg (has no administration in time range)  polyethylene glycol (MIRALAX / GLYCOLAX) packet 17 g (has no administration in time range)  heparin injection 5,000 Units (has no administration in time range)  acetaminophen (TYLENOL) tablet 650 mg (has no administration in time range)  ondansetron (ZOFRAN) injection 4 mg (has no administration in time range)  pantoprazole  (PROTONIX) injection 40 mg (has no administration in time range)  0.9 %  sodium chloride infusion (has no administration in time range)  norepinephrine (LEVOPHED) 4mg  in 231mL premix infusion (has no administration in time range)  HYDROmorphone (DILAUDID) injection 1-2 mg (has no administration in time range)  piperacillin-tazobactam (ZOSYN) IVPB 3.375 g (has no administration in time range)  insulin aspart (novoLOG) injection 0-9 Units (has no administration in time range)  methylPREDNISolone sodium succinate (SOLU-MEDROL) 125 mg/2 mL injection 60 mg (has no administration in time range)  morphine 4 MG/ML injection 4 mg (4 mg Intravenous Given 11/18/2020 1413)  ondansetron (ZOFRAN) injection 4 mg (4 mg Intravenous Given 11/08/2020 1413)  lactated ringers bolus 1,000 mL (1,000 mLs Intravenous Bolus 11/19/2020 1432)  morphine 4 MG/ML injection 4 mg (4 mg Intravenous Given 11/02/2020 1430)  HYDROmorphone (DILAUDID) injection 1 mg (1 mg Intravenous Given 11/25/2020 1505)  iohexol (OMNIPAQUE) 350 MG/ML injection 80 mL (80 mLs Intravenous Contrast Given 11/13/2020 1546)  HYDROmorphone (DILAUDID) injection 1 mg (1 mg Intravenous Given 11/17/2020 1658)  sodium chloride 0.9 % bolus 1,000 mL (1,000  mLs Intravenous Bolus 10/31/2020 1633)  piperacillin-tazobactam (ZOSYN) IVPB 3.375 g (0 g Intravenous Stopped 11/18/2020 1819)  lactated ringers bolus 1,938 mL (1,938 mLs Intravenous Bolus 11/21/2020 1839)  sodium chloride 0.9 % bolus 1,000 mL (1,000 mLs Intravenous Bolus 11/21/2020 1806)    Mobility walks with person assist

## 2020-11-23 NOTE — Consult Note (Signed)
CC: abd pain  Requesting provider: Dr Gilford Raid  HPI: Riley Sharp is an 75 y.o. male who presents to the ED with a history of AAA s/p endovascular stent, lung cancer currently under palliative care with ongoing chemotherapy (last dose 7 days ago) and recent thoracentesis with 1.5 L of fluid removed last week with sudden onset of severe lower abdominal pain.  Patient reports that he has had diarrhea for the last week and then had 2 episodes of emesis this morning, and he suddenly developed severe abdominal pain around and underneath his umbilicus.   He is also complained of feeling short of breath for weeks now but seems to be worse today.  He has had a productive cough but denies any fever.  No h/o abdominal surgeries except for appendectomy and cholecystectomy.  He only takes Tylenol at home but reports that was not helping his pain.  He does not wear oxygen at home. Past Medical History:  Diagnosis Date   AAA (abdominal aortic aneurysm)    CAD (coronary artery disease)    Cancer (HCC)    basal cell carcimona right ear and upper left at shoulder   Glaucoma    Hyperlipidemia    Hypertension    Lung cancer ([place])    squamous cell carcinoma   Myocardial infarction ([facility]) 03/2003   Wears glasses     Past Surgical History:  Procedure Laterality Date   ABDOMINAL AORTIC ENDOVASCULAR STENT GRAFT N/A 11/14/2015   Procedure: ABDOMINAL AORTIC ENDOVASCULAR STENT GRAFT;  Surgeon: Elam Dutch, MD;  Location: [facility];  Service: Vascular;  Laterality: N/A;   APPENDECTOMY     BRONCHIAL BIOPSY  04/18/2020   Procedure: BRONCHIAL BIOPSIES;  Surgeon: [name], DO;  Location: [facility];  Service: Pulmonary;;   BRONCHIAL BRUSHINGS  04/18/2020   Procedure: BRONCHIAL BRUSHINGS;  Surgeon: [name], DO;  Location: [facility];  Service: Pulmonary;;   BRONCHIAL NEEDLE ASPIRATION BIOPSY  04/18/2020   Procedure: BRONCHIAL NEEDLE ASPIRATION BIOPSIES;  Surgeon: [name], DO;   Location: [facility];  Service: Pulmonary;;   BRONCHIAL WASHINGS  04/18/2020   Procedure: BRONCHIAL WASHINGS;  Surgeon: [name], DO;  Location: [facility];  Service: Pulmonary;;   CHOLECYSTECTOMY  2007   Gall Bladder   COLONOSCOPY W/ BIOPSIES AND POLYPECTOMY     EYE SURGERY Bilateral    laser for glaucoma both eyesn x 2   heart stent  Feb. 14, 2005   IR IMAGING GUIDED PORT INSERTION  05/03/2020   THORACENTESIS Left 04/18/2020   Procedure: THORACENTESIS;  Surgeon: [name], DO;  Location: [facility];  Service: Pulmonary;  Laterality: Left;   VIDEO BRONCHOSCOPY WITH ENDOBRONCHIAL NAVIGATION Bilateral 04/18/2020   Procedure: VIDEO BRONCHOSCOPY WITH ENDOBRONCHIAL NAVIGATION;  Surgeon: [name], DO;  Location: [facility];  Service: Pulmonary;  Laterality: Bilateral;    Family History  Problem Relation Age of Onset   Cancer Father    Heart disease Father        After age 69   Hyperlipidemia Father    Hypertension Father    Heart attack Father    Heart disease Sister        After age 17   Hyperlipidemia Sister    Heart attack Sister    Hypertension Other    Heart disease Other    Cancer Other     Social:  reports that he has quit smoking. His smoking use included cigars and cigarettes. He has  a 90.00 pack-year smoking history. He has never used smokeless tobacco. He reports current alcohol use. He reports that he does not use drugs.  Allergies: No Known Allergies  Medications: I have reviewed the patient's current medications.  Results for orders placed or performed during the hospital encounter of 11/13/2020 (from the past 48 hour(s))  CBC with Differential/Platelet     Status: Abnormal (Preliminary result)   Collection Time: 11/10/2020  2:08 PM  Result Value Ref Range   WBC 8.6 4.0 - 10.5 K/uL   RBC 4.71 4.22 - 5.81 MIL/uL   Hemoglobin 14.2 13.0 - 17.0 g/dL   HCT 41.5 39.0 - 52.0 %   MCV 88.1 80.0 - 100.0 fL   MCH 30.1 26.0 - 34.0 pg   MCHC 34.2  30.0 - 36.0 g/dL   RDW 13.5 11.5 - 15.5 %   Platelets  150 - 400 K/uL    PLATELET CLUMPS NOTED ON SMEAR, COUNT APPEARS DECREASED   nRBC 1.3 (H) 0.0 - 0.2 %    Comment: Performed at [facility], [place] [address], [place], [place] [address]   Neutrophils Relative % PENDING %   Neutro Abs PENDING 1.7 - 7.7 K/uL   Band Neutrophils PENDING %   Lymphocytes Relative PENDING %   Lymphs Abs PENDING 0.7 - 4.0 K/uL   Monocytes Relative PENDING %   Monocytes Absolute PENDING 0.1 - 1.0 K/uL   Eosinophils Relative PENDING %   Eosinophils Absolute PENDING 0.0 - 0.5 K/uL   Basophils Relative PENDING %   Basophils Absolute PENDING 0.0 - 0.1 K/uL   WBC Morphology PENDING    RBC Morphology PENDING    Smear Review PENDING    Other PENDING %   nRBC PENDING 0 /100 WBC   Metamyelocytes Relative PENDING %   Myelocytes PENDING %   Promyelocytes Relative PENDING %   Blasts PENDING %   Immature Granulocytes PENDING %   Abs Immature Granulocytes PENDING 0.00 - 0.07 K/uL  Comprehensive metabolic panel     Status: Abnormal   Collection Time: 11/01/2020  2:08 PM  Result Value Ref Range   Sodium 126 (L) 135 - 145 mmol/L   Potassium 4.8 3.5 - 5.1 mmol/L   Chloride 94 (L) 98 - 111 mmol/L   CO2 18 (L) 22 - 32 mmol/L   Glucose, Bld 105 (H) 70 - 99 mg/dL    Comment: Glucose reference range applies only to samples taken after fasting for at least 8 hours.   BUN 31 (H) 8 - 23 mg/dL   Creatinine, Ser 1.57 (H) 0.61 - 1.24 mg/dL   Calcium 8.5 (L) 8.9 - 10.3 mg/dL   Total Protein 6.2 (L) 6.5 - 8.1 g/dL   Albumin 2.9 (L) 3.5 - 5.0 g/dL   AST 43 (H) 15 - 41 U/L   ALT 18 0 - 44 U/L   Alkaline Phosphatase 88 38 - 126 U/L   Total Bilirubin 1.1 0.3 - 1.2 mg/dL   GFR, Estimated 45 (L) >60 mL/min    Comment: (NOTE) Calculated using the CKD-EPI Creatinine Equation (2021)    Anion gap 14 5 - 15    Comment: Performed at [facility], [place] [address] [place], [place] [address]   Lipase, blood     Status: None   Collection Time: 11/21/2020  2:08 PM  Result Value Ref Range   Lipase 16 11 - 51 U/L    Comment: Performed at [facility], [place] [place], [place] [address]  Troponin I (High Sensitivity)     Status: Abnormal   Collection Time: 11/15/2020  2:08 PM  Result Value Ref Range   Troponin I (High Sensitivity) 43 (H) <18 ng/L    Comment: (NOTE) Elevated high sensitivity troponin I (hsTnI) values and significant  changes across serial measurements may suggest ACS but many other  chronic and acute conditions are known to elevate hsTnI results.  Refer to the "Links" section for chest pain algorithms and additional  guidance. Performed at [facility], [place] [address], [place], [place] [address]   Type and screen     Status: None   Collection Time: 11/14/2020  2:15 PM  Result Value Ref Range   ABO/RH(D) O POS    Antibody Screen NEG    Sample Expiration      11/26/2020,2359 Performed at [place] [address], [place], [place] [address]   Troponin I (High Sensitivity)     Status: Abnormal   Collection Time: 11/19/2020  4:12 PM  Result Value Ref Range   Troponin I (High Sensitivity) 44 (H) <18 ng/L    Comment: (NOTE) Elevated high sensitivity troponin I (hsTnI) values and significant  changes across serial measurements may suggest ACS but many other  chronic and acute conditions are known to elevate hsTnI results.  Refer to the "Links" section for chest pain algorithms and additional  guidance. Performed at [facility], [place] [address] [place], [place] [address]   Resp Panel by RT-PCR (Flu A&B, Covid) Nasopharyngeal Swab     Status: None   Collection Time: 11/22/2020  5:43 PM   Specimen: Nasopharyngeal Swab; Nasopharyngeal(NP) swabs in vial transport medium  Result Value Ref Range   SARS Coronavirus 2 by RT PCR NEGATIVE NEGATIVE    Comment: (NOTE) SARS-CoV-2 target  nucleic acids are NOT DETECTED.  The SARS-CoV-2 RNA is generally detectable in upper respiratory specimens during the acute phase of infection. The lowest concentration of SARS-CoV-2 viral copies this assay can detect is 138 copies/mL. A negative result does not preclude SARS-Cov-2 infection and should not be used as the sole basis for treatment or other patient management decisions. A negative result may occur with  improper specimen collection/handling, submission of specimen other than nasopharyngeal swab, presence of viral mutation(s) within the areas targeted by this assay, and inadequate number of viral copies(<138 copies/mL). A negative result must be combined with clinical observations, patient history, and epidemiological information. The expected result is Negative.  Fact Sheet for Patients:  EntrepreneurPulse.com.au  Fact Sheet for Healthcare Providers:  IncredibleEmployment.be  This test is no t yet approved or cleared by the [place] FDA and  has been authorized for detection and/or diagnosis of SARS-CoV-2 by FDA under an Emergency Use Authorization (EUA). This EUA will remain  in effect (meaning this test can be used) for the duration of the COVID-19 declaration under Section 564(b)(1) of the Act, 21 U.S.C.section 360bbb-3(b)(1), unless the authorization is terminated  or revoked sooner.       Influenza A by PCR NEGATIVE NEGATIVE   Influenza B by PCR NEGATIVE NEGATIVE    Comment: (NOTE) The Xpert Xpress SARS-CoV-2/FLU/RSV plus assay is intended as an aid in the diagnosis of influenza from Nasopharyngeal swab specimens and should not be used as a sole basis for treatment. Nasal washings and aspirates are unacceptable for Xpert Xpress SARS-CoV-2/FLU/RSV testing.  Fact Sheet for Patients: EntrepreneurPulse.com.au  Fact Sheet for Healthcare Providers: IncredibleEmployment.be  This  test  is not yet approved or cleared by the [place] and has been authorized for detection and/or diagnosis of SARS-CoV-2 by FDA under an Emergency Use Authorization (EUA). This EUA will remain in effect (meaning this test can be used) for the duration of the COVID-19 declaration under Section 564(b)(1) of the Act, 21 U.S.C. section 360bbb-3(b)(1), unless the authorization is terminated or revoked.  Performed at [facility], [place] [address] [place], [place] [address]     DG Chest Port 1 View  Result Date: 11/11/2020 CLINICAL DATA:  Shortness of breath EXAM: PORTABLE CHEST 1 VIEW COMPARISON:  11/11/2020 FINDINGS: Power port remains in place with the tip at the SVC RA junction. The right chest shows hazy opacity in the upper lobe consistent with chronic scarring. This could be slightly more prominent and a mild right upper lobe pneumonia is not excluded. Persistent chronic pleural density on the left with abnormal density of the adjacent left lung, possibly with a slight increase in pleural density. Otherwise no change. IMPRESSION: Question slight increase in chronic pleural density on the left. Question slight increase in hazy pulmonary opacity in the right upper lobe which could indicate mild pneumonia. Electronically Signed   By: Nelson Chimes M.D.   On: 11/22/2020 14:59   CT Angio Chest/Abd/Pel for Dissection W and/or Wo Contrast  Result Date: 11/21/2020 CLINICAL DATA:  Chest pain.  Aortic dissection suspected. EXAM: CT ANGIOGRAPHY CHEST, ABDOMEN AND PELVIS TECHNIQUE: Non-contrast CT of the chest was initially obtained. Multidetector CT imaging through the chest, abdomen and pelvis was performed using the standard protocol during bolus administration of intravenous contrast. Multiplanar reconstructed images and MIPs were obtained and reviewed to evaluate the vascular anatomy. CONTRAST:  84mL OMNIPAQUE IOHEXOL 350 MG/ML SOLN COMPARISON:  CT chest, abdomen and pelvis  09/05/2020 FINDINGS: CTA CHEST FINDINGS Cardiovascular: Heart size is normal. Small pericardial effusion. Extensive coronary artery calcifications. Main pulmonary artery is normal caliber. Thoracic aorta is tortuous and within normal limits caliber. No intramural hematoma or dissection identified. Moderate to severe atherosclerotic plaques. Mediastinum/Nodes: No bulky lymphadenopathy visualized. A few small calcified lymph nodes are again seen. Stomach is mildly distended and fluid-filled with mild wall thickening throughout. Lungs/Pleura: Large loculated left-sided pleural effusion with associated compressive atelectatic changes. The effusion is mildly decreased in size since previous study with improved aeration of the lower lobe. Similar size and appearance of bilateral masslike consolidative densities in the bilateral upper lobes. Musculoskeletal: Redemonstration of scattered lytic lesions in the ribs as well as the T1 and T7 vertebral bodies. Review of the MIP images confirms the above findings. CTA ABDOMEN AND PELVIS FINDINGS VASCULAR Aorta: Infrarenal abdominal aortic aneurysm again seen with previous surgical changes of aorto bi-iliac bypass graft. The native lumen of the inferior abdominal aorta measures up to 5.4 cm in diameter. No aortic dissection visualized. Celiac: Severe narrowing at the origin of the celiac trunk. SMA: Patent with mild narrowing at the origin. Renals: Patent bilaterally with moderate narrowing on the right and mild narrowing on the left. IMA: Not visualized. Inflow: Dilatation of the distal left common iliac artery measuring up to 1.8 cm in diameter. Bilateral external iliac arteries and visualized femoral arteries are patent. Veins: Grossly unremarkable. Review of the MIP images confirms the above findings. NON-VASCULAR Hepatobiliary: Liver is normal in size. Grossly similar appearance of several hypodense hepatic metastases, given limited evaluation due to phase of contrast.  Gallbladder surgically absent. No biliary ductal dilatation appreciated. Pancreas: No mass or ductal  dilatation identified. Spleen: Normal in size without focal abnormality. Adrenals/Urinary Tract: Adrenal glands appear normal. No new mass or hydronephrosis identified bilaterally. Areas of scarring most prominent in the lower right kidney. Urinary bladder appears grossly normal. Stomach/Bowel: Small hiatal hernia. No bowel obstruction, free air or pneumatosis. Segment of distal/terminal ileum demonstrates diffuse wall thickening/edema and adjacent fat stranding in the mesentery. Mild wall thickening of the cecum and proximal ascending colon with pericolic fat stranding. Mild colonic diverticulosis. Lymphatic: No bulky lymphadenopathy identified. Reproductive: Prostate gland upper normal size. Other: Trace ascites. Musculoskeletal: Stable appearing lytic lesion in the right iliac bone and in the L5 and S1 vertebral bodies. Review of the MIP images confirms the above findings. IMPRESSION: 1. No acute aortic syndrome identified. Stable abdominal aortic aneurysm with previous aorto bi-iliac endograft. 2. Evidence of acute distal ileitis and mild right colitis as described, new since previous study. 3. Numerous additional findings as above and seen previously. Electronically Signed   By: Ofilia Neas M.D.   On: 11/28/2020 16:39    ROS - all of the below systems have been reviewed with the patient and positives are indicated with bold text General: chills, fever or night sweats Eyes: blurry vision or double vision ENT: epistaxis or sore throat Allergy/Immunology: itchy/watery eyes or nasal congestion Hematologic/Lymphatic: bleeding problems, blood clots or swollen lymph nodes Endocrine: temperature intolerance or unexpected weight changes Breast: new or changing breast lumps or nipple discharge Resp: cough, shortness of breath, or wheezing CV: chest pain or dyspnea on exertion GI: as per HPI GU: dysuria,  trouble voiding, or hematuria MSK: joint pain or joint stiffness Neuro: TIA or stroke symptoms Derm: pruritus and skin lesion changes Psych: anxiety and depression  PE Blood pressure 104/67, pulse (!) 139, temperature (!) 97.2 F (36.2 C), temperature source Oral, resp. rate (!) 39, height 5\' 5"  (1.651 m), weight 64.6 kg, SpO2 100 %. Constitutional: NAD; conversant; no deformities Eyes: Moist conjunctiva; no lid lag; anicteric; PERRL Neck: Trachea midline; no thyromegaly Lungs: Normal respiratory effort; no tactile fremitus CV: RRR; no palpable thrills; no pitting edema GI: Abd soft, mildly distended, mild TTP lower abd without peritonitis; no palpable hepatosplenomegaly MSK: Normal range of motion of extremities; no clubbing/cyanosis Psychiatric: Appropriate affect; alert and oriented x3 Lymphatic: No palpable cervical or axillary lymphadenopathy  Results for orders placed or performed during the hospital encounter of 11/08/2020 (from the past 48 hour(s))  CBC with Differential/Platelet     Status: Abnormal (Preliminary result)   Collection Time: 11/17/2020  2:08 PM  Result Value Ref Range   WBC 8.6 4.0 - 10.5 K/uL   RBC 4.71 4.22 - 5.81 MIL/uL   Hemoglobin 14.2 13.0 - 17.0 g/dL   HCT 41.5 39.0 - 52.0 %   MCV 88.1 80.0 - 100.0 fL   MCH 30.1 26.0 - 34.0 pg   MCHC 34.2 30.0 - 36.0 g/dL   RDW 13.5 11.5 - 15.5 %   Platelets  150 - 400 K/uL    PLATELET CLUMPS NOTED ON SMEAR, COUNT APPEARS DECREASED   nRBC 1.3 (H) 0.0 - 0.2 %    Comment: Performed at [facility], [place] [address], [place], [place] [address]   Neutrophils Relative % PENDING %   Neutro Abs PENDING 1.7 - 7.7 K/uL   Band Neutrophils PENDING %   Lymphocytes Relative PENDING %   Lymphs Abs PENDING 0.7 - 4.0 K/uL   Monocytes Relative PENDING %   Monocytes Absolute PENDING 0.1 - 1.0 K/uL  Eosinophils Relative PENDING %   Eosinophils Absolute PENDING 0.0 - 0.5 K/uL   Basophils Relative PENDING %    Basophils Absolute PENDING 0.0 - 0.1 K/uL   WBC Morphology PENDING    RBC Morphology PENDING    Smear Review PENDING    Other PENDING %   nRBC PENDING 0 /100 WBC   Metamyelocytes Relative PENDING %   Myelocytes PENDING %   Promyelocytes Relative PENDING %   Blasts PENDING %   Immature Granulocytes PENDING %   Abs Immature Granulocytes PENDING 0.00 - 0.07 K/uL  Comprehensive metabolic panel     Status: Abnormal   Collection Time: 11/04/2020  2:08 PM  Result Value Ref Range   Sodium 126 (L) 135 - 145 mmol/L   Potassium 4.8 3.5 - 5.1 mmol/L   Chloride 94 (L) 98 - 111 mmol/L   CO2 18 (L) 22 - 32 mmol/L   Glucose, Bld 105 (H) 70 - 99 mg/dL    Comment: Glucose reference range applies only to samples taken after fasting for at least 8 hours.   BUN 31 (H) 8 - 23 mg/dL   Creatinine, Ser 1.57 (H) 0.61 - 1.24 mg/dL   Calcium 8.5 (L) 8.9 - 10.3 mg/dL   Total Protein 6.2 (L) 6.5 - 8.1 g/dL   Albumin 2.9 (L) 3.5 - 5.0 g/dL   AST 43 (H) 15 - 41 U/L   ALT 18 0 - 44 U/L   Alkaline Phosphatase 88 38 - 126 U/L   Total Bilirubin 1.1 0.3 - 1.2 mg/dL   GFR, Estimated 45 (L) >60 mL/min    Comment: (NOTE) Calculated using the CKD-EPI Creatinine Equation (2021)    Anion gap 14 5 - 15    Comment: Performed at [facility], [place] [address] [place], [place] [address]  Lipase, blood     Status: None   Collection Time: 10/29/2020  2:08 PM  Result Value Ref Range   Lipase 16 11 - 51 U/L    Comment: Performed at [facility], [place] [address], [place], [place] [address]  Troponin I (High Sensitivity)     Status: Abnormal   Collection Time: 11/12/2020  2:08 PM  Result Value Ref Range   Troponin I (High Sensitivity) 43 (H) <18 ng/L    Comment: (NOTE) Elevated high sensitivity troponin I (hsTnI) values and significant  changes across serial measurements may suggest ACS but many other  chronic and acute conditions are known to elevate hsTnI results.  Refer to the "Links"  section for chest pain algorithms and additional  guidance. Performed at [facility], [place] [address], [place], [place] [address]   Type and screen     Status: None   Collection Time: 11/03/2020  2:15 PM  Result Value Ref Range   ABO/RH(D) O POS    Antibody Screen NEG    Sample Expiration      11/26/2020,2359 Performed at [place] [address], [place], [place] [address]   Troponin I (High Sensitivity)     Status: Abnormal   Collection Time: 11/19/2020  4:12 PM  Result Value Ref Range   Troponin I (High Sensitivity) 44 (H) <18 ng/L    Comment: (NOTE) Elevated high sensitivity troponin I (hsTnI) values and significant  changes across serial measurements may suggest ACS but many other  chronic and acute conditions are known to elevate hsTnI results.  Refer to the "Links" section for chest pain algorithms and additional  guidance. Performed at [facility], [place] [address] [place], [place] [address]   Resp Panel by RT-PCR (Flu A&B, Covid) Nasopharyngeal Swab     Status: None   Collection Time: 11/25/2020  5:43 PM   Specimen: Nasopharyngeal Swab; Nasopharyngeal(NP) swabs in vial transport medium  Result Value Ref Range   SARS Coronavirus 2 by RT PCR NEGATIVE NEGATIVE    Comment: (NOTE) SARS-CoV-2 target nucleic acids are NOT DETECTED.  The SARS-CoV-2 RNA is generally detectable in upper respiratory specimens during the acute phase of infection. The lowest concentration of SARS-CoV-2 viral copies this assay can detect is 138 copies/mL. A negative result does not preclude SARS-Cov-2 infection and should not be used as the sole basis for treatment or other patient management decisions. A negative result may occur with  improper specimen collection/handling, submission of specimen other than nasopharyngeal swab, presence of viral mutation(s) within the areas targeted by this assay, and inadequate number of viral copies(<138  copies/mL). A negative result must be combined with clinical observations, patient history, and epidemiological information. The expected result is Negative.  Fact Sheet for Patients:  EntrepreneurPulse.com.au  Fact Sheet for Healthcare Providers:  IncredibleEmployment.be  This test is no t yet approved or cleared by the [place] FDA and  has been authorized for detection and/or diagnosis of SARS-CoV-2 by FDA under an Emergency Use Authorization (EUA). This EUA will remain  in effect (meaning this test can be used) for the duration of the COVID-19 declaration under Section 564(b)(1) of the Act, 21 U.S.C.section 360bbb-3(b)(1), unless the authorization is terminated  or revoked sooner.       Influenza A by PCR NEGATIVE NEGATIVE   Influenza B by PCR NEGATIVE NEGATIVE    Comment: (NOTE) The Xpert Xpress SARS-CoV-2/FLU/RSV plus assay is intended as an aid in the diagnosis of influenza from Nasopharyngeal swab specimens and should not be used as a sole basis for treatment. Nasal washings and aspirates are unacceptable for Xpert Xpress SARS-CoV-2/FLU/RSV testing.  Fact Sheet for Patients: EntrepreneurPulse.com.au  Fact Sheet for Healthcare Providers: IncredibleEmployment.be  This test is not yet approved or cleared by the [place] FDA and has been authorized for detection and/or diagnosis of SARS-CoV-2 by FDA under an Emergency Use Authorization (EUA). This EUA will remain in effect (meaning this test can be used) for the duration of the COVID-19 declaration under Section 564(b)(1) of the Act, 21 U.S.C. section 360bbb-3(b)(1), unless the authorization is terminated or revoked.  Performed at [facility], [place] [address], [place], [place] [address]     DG Chest Port 1 View  Result Date: 11/04/2020 CLINICAL DATA:  Shortness of breath EXAM: PORTABLE CHEST 1 VIEW COMPARISON:   11/11/2020 FINDINGS: Power port remains in place with the tip at the SVC RA junction. The right chest shows hazy opacity in the upper lobe consistent with chronic scarring. This could be slightly more prominent and a mild right upper lobe pneumonia is not excluded. Persistent chronic pleural density on the left with abnormal density of the adjacent left lung, possibly with a slight increase in pleural density. Otherwise no change. IMPRESSION: Question slight increase in chronic pleural density on the left. Question slight increase in hazy pulmonary opacity in the right upper lobe which could indicate mild pneumonia. Electronically Signed   By: Nelson Chimes M.D.   On: 11/27/2020 14:59   CT Angio Chest/Abd/Pel for Dissection W and/or Wo Contrast  Result Date: 11/02/2020 CLINICAL DATA:  Chest pain.  Aortic dissection suspected.  EXAM: CT ANGIOGRAPHY CHEST, ABDOMEN AND PELVIS TECHNIQUE: Non-contrast CT of the chest was initially obtained. Multidetector CT imaging through the chest, abdomen and pelvis was performed using the standard protocol during bolus administration of intravenous contrast. Multiplanar reconstructed images and MIPs were obtained and reviewed to evaluate the vascular anatomy. CONTRAST:  37mL OMNIPAQUE IOHEXOL 350 MG/ML SOLN COMPARISON:  CT chest, abdomen and pelvis 09/05/2020 FINDINGS: CTA CHEST FINDINGS Cardiovascular: Heart size is normal. Small pericardial effusion. Extensive coronary artery calcifications. Main pulmonary artery is normal caliber. Thoracic aorta is tortuous and within normal limits caliber. No intramural hematoma or dissection identified. Moderate to severe atherosclerotic plaques. Mediastinum/Nodes: No bulky lymphadenopathy visualized. A few small calcified lymph nodes are again seen. Stomach is mildly distended and fluid-filled with mild wall thickening throughout. Lungs/Pleura: Large loculated left-sided pleural effusion with associated compressive atelectatic changes. The  effusion is mildly decreased in size since previous study with improved aeration of the lower lobe. Similar size and appearance of bilateral masslike consolidative densities in the bilateral upper lobes. Musculoskeletal: Redemonstration of scattered lytic lesions in the ribs as well as the T1 and T7 vertebral bodies. Review of the MIP images confirms the above findings. CTA ABDOMEN AND PELVIS FINDINGS VASCULAR Aorta: Infrarenal abdominal aortic aneurysm again seen with previous surgical changes of aorto bi-iliac bypass graft. The native lumen of the inferior abdominal aorta measures up to 5.4 cm in diameter. No aortic dissection visualized. Celiac: Severe narrowing at the origin of the celiac trunk. SMA: Patent with mild narrowing at the origin. Renals: Patent bilaterally with moderate narrowing on the right and mild narrowing on the left. IMA: Not visualized. Inflow: Dilatation of the distal left common iliac artery measuring up to 1.8 cm in diameter. Bilateral external iliac arteries and visualized femoral arteries are patent. Veins: Grossly unremarkable. Review of the MIP images confirms the above findings. NON-VASCULAR Hepatobiliary: Liver is normal in size. Grossly similar appearance of several hypodense hepatic metastases, given limited evaluation due to phase of contrast. Gallbladder surgically absent. No biliary ductal dilatation appreciated. Pancreas: No mass or ductal dilatation identified. Spleen: Normal in size without focal abnormality. Adrenals/Urinary Tract: Adrenal glands appear normal. No new mass or hydronephrosis identified bilaterally. Areas of scarring most prominent in the lower right kidney. Urinary bladder appears grossly normal. Stomach/Bowel: Small hiatal hernia. No bowel obstruction, free air or pneumatosis. Segment of distal/terminal ileum demonstrates diffuse wall thickening/edema and adjacent fat stranding in the mesentery. Mild wall thickening of the cecum and proximal ascending colon  with pericolic fat stranding. Mild colonic diverticulosis. Lymphatic: No bulky lymphadenopathy identified. Reproductive: Prostate gland upper normal size. Other: Trace ascites. Musculoskeletal: Stable appearing lytic lesion in the right iliac bone and in the L5 and S1 vertebral bodies. Review of the MIP images confirms the above findings. IMPRESSION: 1. No acute aortic syndrome identified. Stable abdominal aortic aneurysm with previous aorto bi-iliac endograft. 2. Evidence of acute distal ileitis and mild right colitis as described, new since previous study. 3. Numerous additional findings as above and seen previously. Electronically Signed   By: Ofilia Neas M.D.   On: 11/04/2020 16:39     A/P: Riley Sharp is an 75 y.o. 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  No role for surgery currently.  NPO, IVF's, broad spectrum antibiotics.  Trend lactate, hgb and wbc.   Rec Palliative consult to discuss goals of care  Rosario Adie, MD  [facility]

## 2020-11-23 NOTE — ED Notes (Signed)
Pt transported to CT ?

## 2020-11-23 NOTE — Telephone Encounter (Addendum)
Returned pt call   Pt audibly in respiratory distress telling me about his uncontrolled nausea and diarrhea and SOB. Stage 4 lung cancer and Hx pericardial effusion.Pt neutropenic yesterday . Neutropenic precautions reviewed with pt. Received neulasta last Thursday.  I instructed pt to call 911 now. He said he will call them now.  He said he is not alone.  Mohamed advised of above situation.

## 2020-11-23 NOTE — Anesthesia Procedure Notes (Signed)
Procedure Name: Intubation Date/Time: 11/26/2020 8:50 PM Performed by: Raenette Rover, CRNA Pre-anesthesia Checklist: Patient identified, Emergency Drugs available, Suction available and Patient being monitored Patient Re-evaluated:Patient Re-evaluated prior to induction Oxygen Delivery Method: Circle system utilized Preoxygenation: Pre-oxygenation with 100% oxygen Induction Type: IV induction and Rapid sequence Ventilation: Mask ventilation without difficulty Laryngoscope Size: Mac and 4 Grade View: Grade I Tube type: Oral Tube size: 7.5 mm Number of attempts: 1 Airway Equipment and Method: Stylet Placement Confirmation: ETT inserted through vocal cords under direct vision, positive ETCO2 and breath sounds checked- equal and bilateral Secured at: 23 cm Tube secured with: Tape Dental Injury: Teeth and Oropharynx as per pre-operative assessment

## 2020-11-24 ENCOUNTER — Inpatient Hospital Stay (HOSPITAL_COMMUNITY): Payer: Medicare Other

## 2020-11-24 DIAGNOSIS — I469 Cardiac arrest, cause unspecified: Secondary | ICD-10-CM | POA: Diagnosis not present

## 2020-11-24 DIAGNOSIS — A419 Sepsis, unspecified organism: Secondary | ICD-10-CM | POA: Diagnosis not present

## 2020-11-24 DIAGNOSIS — L8990 Pressure ulcer of unspecified site, unspecified stage: Secondary | ICD-10-CM | POA: Insufficient documentation

## 2020-11-24 DIAGNOSIS — R6521 Severe sepsis with septic shock: Secondary | ICD-10-CM

## 2020-11-24 LAB — BLOOD CULTURE ID PANEL (REFLEXED) - BCID2

## 2020-11-24 LAB — CBC
HCT: 40.2 % (ref 39.0–52.0)
Hemoglobin: 12.6 g/dL — ABNORMAL LOW (ref 13.0–17.0)
MCH: 30.5 pg (ref 26.0–34.0)
MCHC: 31.3 g/dL (ref 30.0–36.0)
MCV: 97.3 fL (ref 80.0–100.0)
Platelets: 72 10*3/uL — ABNORMAL LOW (ref 150–400)
RBC: 4.13 MIL/uL — ABNORMAL LOW (ref 4.22–5.81)
RDW: 13.7 % (ref 11.5–15.5)
WBC: 11 10*3/uL — ABNORMAL HIGH (ref 4.0–10.5)
nRBC: 3.3 % — ABNORMAL HIGH (ref 0.0–0.2)

## 2020-11-24 LAB — BLOOD GAS, ARTERIAL
Acid-base deficit: 18 mmol/L — ABNORMAL HIGH (ref 0.0–2.0)
Acid-base deficit: 21.3 mmol/L — ABNORMAL HIGH (ref 0.0–2.0)
Bicarbonate: 10.6 mmol/L — ABNORMAL LOW (ref 20.0–28.0)
Bicarbonate: 7.9 mmol/L — ABNORMAL LOW (ref 20.0–28.0)
FIO2: 100
FIO2: 100
MECHVT: 430 mL
MECHVT: 430 mL
O2 Saturation: 95.7 %
O2 Saturation: 98 %
PEEP: 8 cmH2O
PEEP: 8 cmH2O
Patient temperature: 36.6
Patient temperature: 38.5
RATE: 16 resp/min
RATE: 16 resp/min
pCO2 arterial: 32.6 mmHg (ref 32.0–48.0)
pCO2 arterial: 37.8 mmHg (ref 32.0–48.0)
pH, Arterial: 7.013 — CL (ref 7.350–7.450)
pH, Arterial: 7.087 — CL (ref 7.350–7.450)
pO2, Arterial: 135 mmHg — ABNORMAL HIGH (ref 83.0–108.0)
pO2, Arterial: 226 mmHg — ABNORMAL HIGH (ref 83.0–108.0)

## 2020-11-24 LAB — GLUCOSE, CAPILLARY
Glucose-Capillary: 190 mg/dL — ABNORMAL HIGH (ref 70–99)
Glucose-Capillary: 35 mg/dL — CL (ref 70–99)

## 2020-11-24 LAB — LACTIC ACID, PLASMA
Lactic Acid, Venous: 8.4 mmol/L (ref 0.5–1.9)
Lactic Acid, Venous: >9 mmol/L (ref 0.5–1.9)

## 2020-11-24 LAB — COMPREHENSIVE METABOLIC PANEL
ALT: 89 U/L — ABNORMAL HIGH (ref 0–44)
AST: 227 U/L — ABNORMAL HIGH (ref 15–41)
Albumin: 1.7 g/dL — ABNORMAL LOW (ref 3.5–5.0)
Alkaline Phosphatase: 107 U/L (ref 38–126)
Anion gap: 13 (ref 5–15)
BUN: 30 mg/dL — ABNORMAL HIGH (ref 8–23)
CO2: 14 mmol/L — ABNORMAL LOW (ref 22–32)
Calcium: 6.9 mg/dL — ABNORMAL LOW (ref 8.9–10.3)
Chloride: 99 mmol/L (ref 98–111)
Creatinine, Ser: 2.07 mg/dL — ABNORMAL HIGH (ref 0.61–1.24)
GFR, Estimated: 33 mL/min — ABNORMAL LOW (ref >60)
Glucose, Bld: 133 mg/dL — ABNORMAL HIGH (ref 70–99)
Potassium: 5.1 mmol/L (ref 3.5–5.1)
Sodium: 126 mmol/L — ABNORMAL LOW (ref 135–145)
Total Bilirubin: 1.3 mg/dL — ABNORMAL HIGH (ref 0.3–1.2)
Total Protein: 3.8 g/dL — ABNORMAL LOW (ref 6.5–8.1)

## 2020-11-24 LAB — MAGNESIUM: Magnesium: 2.1 mg/dL (ref 1.7–2.4)

## 2020-11-24 LAB — TRIGLYCERIDES: Triglycerides: 35 mg/dL (ref <150)

## 2020-11-24 LAB — MRSA NEXT GEN BY PCR, NASAL: MRSA by PCR Next Gen: NOT DETECTED

## 2020-11-24 LAB — PHOSPHORUS: Phosphorus: 7.6 mg/dL — ABNORMAL HIGH (ref 2.5–4.6)

## 2020-11-24 MED ORDER — NOREPINEPHRINE 16 MG/250ML-% IV SOLN
0.0000 ug/min | INTRAVENOUS | Status: DC
  Administered 2020-11-24: 50 ug/min via INTRAVENOUS
  Administered 2020-11-24: 55 ug/min via INTRAVENOUS
  Administered 2020-11-24: 50 ug/min via INTRAVENOUS
  Filled 2020-11-24 (×2): qty 250

## 2020-11-24 MED ORDER — SODIUM CHLORIDE 0.9 % IV SOLN
200.0000 mg | Freq: Once | INTRAVENOUS | Status: AC
  Administered 2020-11-24: 200 mg via INTRAVENOUS
  Filled 2020-11-24: qty 200

## 2020-11-24 MED ORDER — DEXTROSE 50 % IV SOLN: 25.0000 g | INTRAVENOUS | Status: AC

## 2020-11-24 MED ORDER — SODIUM BICARBONATE 8.4 % IV SOLN
100.0000 meq | Freq: Once | INTRAVENOUS | Status: AC
  Administered 2020-11-24: 100 meq via INTRAVENOUS

## 2020-11-24 MED ORDER — VANCOMYCIN HCL 1250 MG/250ML IV SOLN: 1250.0000 mg | INTRAVENOUS | Status: DC

## 2020-11-24 MED ORDER — PHENYLEPHRINE 40 MCG/ML (10ML) SYRINGE FOR IV PUSH (FOR BLOOD PRESSURE SUPPORT)
PREFILLED_SYRINGE | INTRAVENOUS | Status: AC
  Administered 2020-11-24: 400 ug
  Filled 2020-11-24: qty 10

## 2020-11-24 MED ORDER — DEXTROSE 50 % IV SOLN
INTRAVENOUS | Status: AC
  Administered 2020-11-24: 25 g via INTRAVENOUS
  Filled 2020-11-24: qty 50

## 2020-11-24 MED ORDER — VANCOMYCIN HCL 1500 MG/300ML IV SOLN
1500.0000 mg | Freq: Once | INTRAVENOUS | Status: AC
  Administered 2020-11-24: 1500 mg via INTRAVENOUS
  Filled 2020-11-24: qty 300

## 2020-11-24 MED ORDER — SODIUM BICARBONATE 8.4 % IV SOLN
50.0000 meq | Freq: Once | INTRAVENOUS | Status: AC
  Administered 2020-11-24: 50 meq via INTRAVENOUS
  Filled 2020-11-24: qty 50

## 2020-11-24 MED ORDER — SODIUM CHLORIDE 0.9 % IV SOLN: 100.0000 mg | INTRAVENOUS | Status: DC

## 2020-11-24 MED FILL — Medication: Qty: 1 | Status: AC

## 2020-11-26 LAB — CULTURE, BLOOD (ROUTINE X 2)
Special Requests: ADEQUATE
Special Requests: ADEQUATE

## 2020-11-29 NOTE — Progress Notes (Signed)
Mr. Riley Sharp and s/o [name] are at bedside.  He is now DNR.  Anticipate he will pass away this morning. Chaplain at bedside with family.  Julian Hy, DO 12-24-2020 7:25 AM [facility]

## 2020-11-29 NOTE — Addendum Note (Signed)
Addendum  created 2020/12/04 0106 by Raenette Rover, CRNA   Clinical Note Signed

## 2020-11-29 NOTE — Progress Notes (Signed)
Spiritual care paged for [place].  Provided support with s/o Riley Sharp and son, Riley Sharp.  Daughter in law Riley Sharp and granddaughter at bedside now.    Riley Sharp is described as a caring, thoughtful, intelligent man who is able to fix anything he touches.  Riley Sharp describes all the ways he cares for others that no one else notices.   Riley Sharp and Riley Sharp are [employer].  Riley Sharp, Riley Sharp, is [place].   Riley Sharp, MDiv. [place]

## 2020-11-29 NOTE — Death Summary Note (Signed)
DEATH SUMMARY   Patient Details  Name: Riley Sharp MRN: [id number] DOB: 1945/07/23  Admission/Discharge Information   Admit Date:  12-13-2020  Date of Death: Date of Death: 12-14-2020  Time of Death: Time of Death: 0924  Length of Stay: 1  Referring Physician: [name], MD (Inactive)   Reason(s) for Hospitalization  Septic shock  Diagnoses  Preliminary cause of death: septic shock- present on admission Secondary Diagnoses (including complications and co-morbidities):  Active Problems:   Septic shock (HCC)   Pressure injury of skin Acute infectious colitis- present on admission Gram variable rod bacteremia, present on admission Lactic acidosis- present on admission Stage IV lung cancer with mets to bone, liver, pleura- present on admission Immunocompromised due to chemotherapy- present on admission Nausea and vomiting, present on admission AKI- present on admission hyperphosphatemia Hyponatremia, present on admission Hyperbilirubinemia, elevated transaminases Glaucoma- present on admission HTN H/o CAD with MI PAD with aortic aneurysm and previous biiliac bypass hyperglycemia  Brief Hospital Course (including significant findings, care, treatment, and services provided and events leading to death)  Riley Sharp is a 75 y.o. year old male who presented 2022/12/14 to [facility] ER with abdominal pain, nausea, vomiting, diarrhea.    He was diagnosed in March 2022 Stage IV Non-Small Cell Lung Cancer, Squamous Cell.  Initial presentation with a RUL, LUL lung mass and loculated right pleural effusion.  PD-L1 expression 0%.  In addition, PET CT showed hypermetabolic areas in the right hilar, paramediastinal lymphadenopathy, small bone lesions on C2, L5, right ribs and right iliac crest.  He was started on palliative radiotherapy, last treatment in 05/2020.  In addition to radiotherapy he was treated with paclitaxel, carboplatin & Beryle Flock.  His last dose of keytruda was 10/19/20.  He had a  restaging PET CT on 10/12 which showed evidence of disease progression.  On 10/14 he had 1.1L of cloudy dark brown pleural fluid drained.  He was subsequently started on second line systemic chemotherapy with docetaxel, cyramza and nueulasta support, first dose 11/15/20.  He has known small pericardial effusion.   He was seen in the [facility] on 10/25 for approximately 1 week of increasing fatigue, diarrhea not responsive to imodium, nausea/vomiting not responsive to compazine. During that visit, he also noted increased HR to the 100's despite no exertion.   Last night he developed severe abdominal pain and vomiting. Previously his diarrhea had been painless.  No confusion, rashes, hot swollen joints, coughing, sputum production.   On 14-Dec-2022, the cancer center called to check on the patient and he was clearly in distress and they instructed him to call 911. He reported severe sudden onset of abdominal pain at the umbilicus.  Initial ER work up showed Na 126, K 4.8, Cl 94, CO2 18, glucose 105, BUN 31 / Sr Cr 1.57, albumin 2.9, AST 43, bilirubin 1.1, troponin 43, WBC 8.6, Hgb 14.2, platelets clumped.    PCCM called for evaluation.  His significant other [name] is at bedside with his pastor.  He was started on broad spectrum antibiotics with addition of antifungals. He quickly developed worsening MOF, septic shock, refractory metabolic acidosis requiring intubation, bicarbonate, escalating pressors. His family was at bedside throughout the night. He had a cardiac arrest with ROSC but ongoing acidosis and escalating pressor requirements. Family changed code status to DNR. He expired with family at bedside on 12-14-2020 at 9:24.   Pertinent Labs and Studies  Significant Diagnostic Studies DG Chest 1 View  Result Date: 11/11/2020 CLINICAL DATA:  Post thoracentesis on the left. EXAM: CHEST  1 VIEW COMPARISON:  Radiographs 09/19/2020 and 04/18/2020.  CT 11/08/2020. FINDINGS: 1135 hours. Right IJ Port-A-Cath  extends to the level of the upper right atrium. The heart size and mediastinal contours are stable. The left pleural effusion has significantly decreased in volume compared with the recent CT. There is residual partially loculated fluid laterally which is similar to that demonstrated on radiographs of 2 months ago. Stable left lung compressive atelectasis and right upper lobe mass-like consolidation. There is no significant right-sided pleural effusion or pneumothorax. The bones appear unchanged. IMPRESSION: Interval decreased volume of left-sided pleural effusion without complicating pneumothorax. A residual partially loculated effusion laterally is similar to prior radiographs from 2 months ago. Stable underlying bilateral pulmonary opacities. Electronically Signed   By: Richardean Sale M.D.   On: 11/11/2020 12:10   CT Chest W Contrast  Result Date: 11/10/2020 CLINICAL DATA:  Lung cancer treated with chemotherapy and radiation therapy. Ongoing Keytruda. Worsening shortness of breath. EXAM: CT CHEST, ABDOMEN, AND PELVIS WITH CONTRAST TECHNIQUE: Multidetector CT imaging of the chest, abdomen and pelvis was performed following the standard protocol during bolus administration of intravenous contrast. CONTRAST:  71m OMNIPAQUE IOHEXOL 350 MG/ML SOLN COMPARISON:  CT chest 09/19/2020 and CT chest abdomen pelvis 09/05/2020. FINDINGS: CT CHEST FINDINGS Cardiovascular: Right IJ Port-A-Cath terminates at the SVC RA junction. Atherosclerotic calcification of the aorta and coronary arteries. Heart size normal. Small pericardial effusion is new. There may be slight associated peripheral hyperattenuation. Mediastinum/Nodes: No pathologically enlarged mediastinal or hilar lymph nodes. Calcified subcarinal and hilar lymph nodes. No axillary adenopathy. Esophagus is unremarkable. Lungs/Pleura: Enlarging loculated thick-walled left pleural fluid collection with compressive atelectasis in the left upper and left lower lobes.  Nodular and masslike areas of consolidation in both upper lobes appear similar to 09/19/2020. Centrilobular emphysema. 4 mm subpleural peripheral right lower lobe nodule (7/93), increased in size from 3 mm on 09/05/2020. No right pleural fluid. Airway is unremarkable. Musculoskeletal: There are new lytic lesions, namely within the right second and fifth ribs with a pathologic fracture involving the right fifth rib. Lytic lesion in the T7 vertebral body is new. Possible lytic lesion in the T1 vertebral body. CT ABDOMEN PELVIS FINDINGS Hepatobiliary: New mildly heterogeneous lesions in the liver measure up to 2.0 cm in the dome of the right hepatic lobe. Cholecystectomy. No abnormal biliary ductal dilatation. Pancreas: Negative. Spleen: Negative. Adrenals/Urinary Tract: Adrenal glands are unremarkable. Scarring in the lower pole right kidney. Subcentimeter low-attenuation lesions in the kidneys are too small to characterize but statistically, cysts are likely. Ureters are decompressed. Bladder is grossly unremarkable. Stomach/Bowel: Stomach, small bowel and colon are unremarkable. Fair amount of stool is seen in the colon, indicative of constipation. Appendix is not readily visualized. Vascular/Lymphatic: Atherosclerotic calcification of the aorta with an endovascular aorto bi-iliac stent graft. Native aneurysm sac measures 5.5 cm, as before. Gastrohepatic ligament lymph nodes are not enlarged by CT size criteria. Reproductive: Prostate is visualized. Other: No free fluid.  Mesenteries and peritoneum are unremarkable. Musculoskeletal: New lytic lesion in the right iliac wing. Degenerative changes in the spine. Levoconvex scoliosis. IMPRESSION: 1. Normal progression disease as evidenced by an enlarging right pulmonary nodule, new hepatic metastatic disease and new osseous metastatic disease. 2. Masslike areas of consolidation in the upper lobes, similar. 3. Large loculated left pleural effusion with pleural thickening,  increased in size from 09/19/2020 and compatible with an empyema or malignant pleural effusion. 4. New small pericardial effusion. Peripheral hyperattenuation,  difficult to exclude pericarditis. 5. Aorto bi-iliac endograft stent with stable native aneurysm sac measuring 5.5 cm. 6. Aortic atherosclerosis (ICD10-I70.0). Coronary artery calcification. 7.  Emphysema (ICD10-J43.9). Electronically Signed   By: Lorin Picket M.D.   On: 11/10/2020 11:38   CT Abdomen Pelvis W Contrast  Result Date: 11/10/2020 CLINICAL DATA:  Lung cancer treated with chemotherapy and radiation therapy. Ongoing Keytruda. Worsening shortness of breath. EXAM: CT CHEST, ABDOMEN, AND PELVIS WITH CONTRAST TECHNIQUE: Multidetector CT imaging of the chest, abdomen and pelvis was performed following the standard protocol during bolus administration of intravenous contrast. CONTRAST:  59m OMNIPAQUE IOHEXOL 350 MG/ML SOLN COMPARISON:  CT chest 09/19/2020 and CT chest abdomen pelvis 09/05/2020. FINDINGS: CT CHEST FINDINGS Cardiovascular: Right IJ Port-A-Cath terminates at the SVC RA junction. Atherosclerotic calcification of the aorta and coronary arteries. Heart size normal. Small pericardial effusion is new. There may be slight associated peripheral hyperattenuation. Mediastinum/Nodes: No pathologically enlarged mediastinal or hilar lymph nodes. Calcified subcarinal and hilar lymph nodes. No axillary adenopathy. Esophagus is unremarkable. Lungs/Pleura: Enlarging loculated thick-walled left pleural fluid collection with compressive atelectasis in the left upper and left lower lobes. Nodular and masslike areas of consolidation in both upper lobes appear similar to 09/19/2020. Centrilobular emphysema. 4 mm subpleural peripheral right lower lobe nodule (7/93), increased in size from 3 mm on 09/05/2020. No right pleural fluid. Airway is unremarkable. Musculoskeletal: There are new lytic lesions, namely within the right second and fifth ribs with a  pathologic fracture involving the right fifth rib. Lytic lesion in the T7 vertebral body is new. Possible lytic lesion in the T1 vertebral body. CT ABDOMEN PELVIS FINDINGS Hepatobiliary: New mildly heterogeneous lesions in the liver measure up to 2.0 cm in the dome of the right hepatic lobe. Cholecystectomy. No abnormal biliary ductal dilatation. Pancreas: Negative. Spleen: Negative. Adrenals/Urinary Tract: Adrenal glands are unremarkable. Scarring in the lower pole right kidney. Subcentimeter low-attenuation lesions in the kidneys are too small to characterize but statistically, cysts are likely. Ureters are decompressed. Bladder is grossly unremarkable. Stomach/Bowel: Stomach, small bowel and colon are unremarkable. Fair amount of stool is seen in the colon, indicative of constipation. Appendix is not readily visualized. Vascular/Lymphatic: Atherosclerotic calcification of the aorta with an endovascular aorto bi-iliac stent graft. Native aneurysm sac measures 5.5 cm, as before. Gastrohepatic ligament lymph nodes are not enlarged by CT size criteria. Reproductive: Prostate is visualized. Other: No free fluid.  Mesenteries and peritoneum are unremarkable. Musculoskeletal: New lytic lesion in the right iliac wing. Degenerative changes in the spine. Levoconvex scoliosis. IMPRESSION: 1. Normal progression disease as evidenced by an enlarging right pulmonary nodule, new hepatic metastatic disease and new osseous metastatic disease. 2. Masslike areas of consolidation in the upper lobes, similar. 3. Large loculated left pleural effusion with pleural thickening, increased in size from 09/19/2020 and compatible with an empyema or malignant pleural effusion. 4. New small pericardial effusion. Peripheral hyperattenuation, difficult to exclude pericarditis. 5. Aorto bi-iliac endograft stent with stable native aneurysm sac measuring 5.5 cm. 6. Aortic atherosclerosis (ICD10-I70.0). Coronary artery calcification. 7.  Emphysema  (ICD10-J43.9). Electronically Signed   By: MLorin PicketM.D.   On: 11/10/2020 11:38   DG CHEST PORT 1 VIEW  Result Date: 1November 17, 2022CLINICAL DATA:  Respiratory distress EXAM: PORTABLE CHEST 1 VIEW COMPARISON:  Yesterday FINDINGS: Endotracheal tube with tip at the clavicular heads. Porta catheter on the right with tip at the upper cavoatrial junction. Worsening aeration of the left lung which is partially collapsed by a large left pleural  effusion that is loculated and complex. Right apical nodular opacity. Stable heart size. IMPRESSION: 1. Worsening left-sided aeration. Large loculated left pleural effusion. 2. Stable hardware. Electronically Signed   By: Jorje Guild M.D.   On: 12-19-2020 07:27   DG CHEST PORT 1 VIEW  Result Date: 11/09/2020 CLINICAL DATA:  Status post intubation. EXAM: PORTABLE CHEST 1 VIEW COMPARISON:  Chest radiograph dated 11/09/2020 FINDINGS: The endotracheal tube with tip approximately 2 cm above the carina. Right-sided Port-A-Cath with tip at the cavoatrial junction. No interval change in the left pleural effusion and right lung opacity. No pneumothorax. Stable cardiomediastinal silhouette. No acute osseous pathology. IMPRESSION: Endotracheal tube with tip approximately 2 cm above the carina. No other interval change. Electronically Signed   By: Anner Crete M.D.   On: 11/15/2020 23:30   DG Chest Port 1 View  Result Date: 11/18/2020 CLINICAL DATA:  Lung cancer and hypoxia. EXAM: PORTABLE CHEST 1 VIEW COMPARISON:  Chest radiograph dated 11/17/2020.  CT dated 11/03/2020 FINDINGS: Right-sided Port-A-Cath in similar position. Left-sided pleural effusion similar or slightly decreased since the earlier radiograph. Associated compressive atelectasis of the left lung. Ill-defined opacity in the right upper lobe similar to prior radiograph. Stable cardiomediastinal silhouette. No acute osseous pathology. IMPRESSION: Left-sided pleural effusion similar or slightly decreased  since the earlier radiograph. Electronically Signed   By: Anner Crete M.D.   On: 11/06/2020 21:33   DG Chest Port 1 View  Result Date: 11/17/2020 CLINICAL DATA:  Shortness of breath EXAM: PORTABLE CHEST 1 VIEW COMPARISON:  11/11/2020 FINDINGS: Power port remains in place with the tip at the SVC RA junction. The right chest shows hazy opacity in the upper lobe consistent with chronic scarring. This could be slightly more prominent and a mild right upper lobe pneumonia is not excluded. Persistent chronic pleural density on the left with abnormal density of the adjacent left lung, possibly with a slight increase in pleural density. Otherwise no change. IMPRESSION: Question slight increase in chronic pleural density on the left. Question slight increase in hazy pulmonary opacity in the right upper lobe which could indicate mild pneumonia. Electronically Signed   By: Nelson Chimes M.D.   On: 11/16/2020 14:59   CT Angio Chest/Abd/Pel for Dissection W and/or Wo Contrast  Result Date: 11/21/2020 CLINICAL DATA:  Chest pain.  Aortic dissection suspected. EXAM: CT ANGIOGRAPHY CHEST, ABDOMEN AND PELVIS TECHNIQUE: Non-contrast CT of the chest was initially obtained. Multidetector CT imaging through the chest, abdomen and pelvis was performed using the standard protocol during bolus administration of intravenous contrast. Multiplanar reconstructed images and MIPs were obtained and reviewed to evaluate the vascular anatomy. CONTRAST:  26m OMNIPAQUE IOHEXOL 350 MG/ML SOLN COMPARISON:  CT chest, abdomen and pelvis 09/05/2020 FINDINGS: CTA CHEST FINDINGS Cardiovascular: Heart size is normal. Small pericardial effusion. Extensive coronary artery calcifications. Main pulmonary artery is normal caliber. Thoracic aorta is tortuous and within normal limits caliber. No intramural hematoma or dissection identified. Moderate to severe atherosclerotic plaques. Mediastinum/Nodes: No bulky lymphadenopathy visualized. A few small  calcified lymph nodes are again seen. Stomach is mildly distended and fluid-filled with mild wall thickening throughout. Lungs/Pleura: Large loculated left-sided pleural effusion with associated compressive atelectatic changes. The effusion is mildly decreased in size since previous study with improved aeration of the lower lobe. Similar size and appearance of bilateral masslike consolidative densities in the bilateral upper lobes. Musculoskeletal: Redemonstration of scattered lytic lesions in the ribs as well as the T1 and T7 vertebral bodies. Review of the MIP  images confirms the above findings. CTA ABDOMEN AND PELVIS FINDINGS VASCULAR Aorta: Infrarenal abdominal aortic aneurysm again seen with previous surgical changes of aorto bi-iliac bypass graft. The native lumen of the inferior abdominal aorta measures up to 5.4 cm in diameter. No aortic dissection visualized. Celiac: Severe narrowing at the origin of the celiac trunk. SMA: Patent with mild narrowing at the origin. Renals: Patent bilaterally with moderate narrowing on the right and mild narrowing on the left. IMA: Not visualized. Inflow: Dilatation of the distal left common iliac artery measuring up to 1.8 cm in diameter. Bilateral external iliac arteries and visualized femoral arteries are patent. Veins: Grossly unremarkable. Review of the MIP images confirms the above findings. NON-VASCULAR Hepatobiliary: Liver is normal in size. Grossly similar appearance of several hypodense hepatic metastases, given limited evaluation due to phase of contrast. Gallbladder surgically absent. No biliary ductal dilatation appreciated. Pancreas: No mass or ductal dilatation identified. Spleen: Normal in size without focal abnormality. Adrenals/Urinary Tract: Adrenal glands appear normal. No new mass or hydronephrosis identified bilaterally. Areas of scarring most prominent in the lower right kidney. Urinary bladder appears grossly normal. Stomach/Bowel: Small hiatal hernia.  No bowel obstruction, free air or pneumatosis. Segment of distal/terminal ileum demonstrates diffuse wall thickening/edema and adjacent fat stranding in the mesentery. Mild wall thickening of the cecum and proximal ascending colon with pericolic fat stranding. Mild colonic diverticulosis. Lymphatic: No bulky lymphadenopathy identified. Reproductive: Prostate gland upper normal size. Other: Trace ascites. Musculoskeletal: Stable appearing lytic lesion in the right iliac bone and in the L5 and S1 vertebral bodies. Review of the MIP images confirms the above findings. IMPRESSION: 1. No acute aortic syndrome identified. Stable abdominal aortic aneurysm with previous aorto bi-iliac endograft. 2. Evidence of acute distal ileitis and mild right colitis as described, new since previous study. 3. Numerous additional findings as above and seen previously. Electronically Signed   By: Ofilia Neas M.D.   On: 11/07/2020 16:39   US Thoracentesis Asp Pleural space w/IMG guide  Result Date: 11/11/2020 INDICATION: Patient with history stage IV non-small cell lung cancer, spine cell carcinoma with recurrent pleural effusion. Request for diagnostic and therapeutic thoracentesis. EXAM: ULTRASOUND GUIDED DIAGNOSTIC AND THERAPEUTIC THORACENTESIS MEDICATIONS: 8 mL 1% lidocaine COMPLICATIONS: None immediate. PROCEDURE: An ultrasound guided thoracentesis was thoroughly discussed with the patient and questions answered. The benefits, risks, alternatives and complications were also discussed. The patient understands and wishes to proceed with the procedure. Written consent was obtained. Ultrasound was performed to localize and mark an adequate pocket of fluid in the left chest. The area was then prepped and draped in the normal sterile fashion. 1% Lidocaine was used for local anesthesia. Under ultrasound guidance a 6 Fr Safe-T-Centesis catheter was introduced. Thoracentesis was performed. The catheter was removed and a dressing  applied. FINDINGS: A total of approximately 1.1 L of cloudy, dark brown fluid was removed. Samples were sent to the laboratory as requested by the clinical team. IMPRESSION: Successful ultrasound guided left thoracentesis yielding 1.1 L of pleural fluid. Read by: Narda Rutherford, NP Electronically Signed   By: Markus Daft M.D.   On: 11/11/2020 12:26    Microbiology Recent Results (from the past 240 hour(s))  Culture, blood (routine x 2)     Status: None (Preliminary result)   Collection Time: 11/04/2020  5:26 PM   Specimen: BLOOD  Result Value Ref Range Status   Specimen Description   Final    BLOOD PORTA CATH Performed at [facility]  [address] [place], [place] [address]    Special Requests   Final    BOTTLES DRAWN AEROBIC AND ANAEROBIC Blood Culture adequate volume Performed at [facility] [address] [place], [place] [address]    Culture   Final    NO GROWTH < 12 HOURS Performed at [facility] [address] [place], [place] [address]    Report Status PENDING  Incomplete  Culture, blood (routine x 2)     Status: None (Preliminary result)   Collection Time: 11/26/2020  5:31 PM   Specimen: BLOOD  Result Value Ref Range Status   Specimen Description   Final    BLOOD LEFT ANTECUBITAL Performed at [facility] [address] [place], [place] [address]    Special Requests   Final    BOTTLES DRAWN AEROBIC AND ANAEROBIC Blood Culture adequate volume Performed at [facility] [address], [place], [place] [address]    Culture   Final    NO GROWTH < 12 HOURS Performed at [facility] [address], [place], [place] [address]    Report Status PENDING  Incomplete  Resp Panel by RT-PCR (Flu A&B, Covid) Nasopharyngeal Swab     Status: None   Collection Time: 11/10/2020  5:43 PM   Specimen: Nasopharyngeal Swab; Nasopharyngeal(NP) swabs in vial transport medium  Result Value Ref Range  Status   SARS Coronavirus 2 by RT PCR NEGATIVE NEGATIVE Final    Comment: (NOTE) SARS-CoV-2 target nucleic acids are NOT DETECTED.  The SARS-CoV-2 RNA is generally detectable in upper respiratory specimens during the acute phase of infection. The lowest concentration of SARS-CoV-2 viral copies this assay can detect is 138 copies/mL. A negative result does not preclude SARS-Cov-2 infection and should not be used as the sole basis for treatment or other patient management decisions. A negative result may occur with  improper specimen collection/handling, submission of specimen other than nasopharyngeal swab, presence of viral mutation(s) within the areas targeted by this assay, and inadequate number of viral copies(<138 copies/mL). A negative result must be combined with clinical observations, patient history, and epidemiological information. The expected result is Negative.  Fact Sheet for Patients:  EntrepreneurPulse.com.au  Fact Sheet for Healthcare Providers:  IncredibleEmployment.be  This test is no t yet approved or cleared by the [place] FDA and  has been authorized for detection and/or diagnosis of SARS-CoV-2 by FDA under an Emergency Use Authorization (EUA). This EUA will remain  in effect (meaning this test can be used) for the duration of the COVID-19 declaration under Section 564(b)(1) of the Act, 21 U.S.C.section 360bbb-3(b)(1), unless the authorization is terminated  or revoked sooner.       Influenza A by PCR NEGATIVE NEGATIVE Final   Influenza B by PCR NEGATIVE NEGATIVE Final    Comment: (NOTE) The Xpert Xpress SARS-CoV-2/FLU/RSV plus assay is intended as an aid in the diagnosis of influenza from Nasopharyngeal swab specimens and should not be used as a sole basis for treatment. Nasal washings and aspirates are unacceptable for Xpert Xpress SARS-CoV-2/FLU/RSV testing.  Fact Sheet for  Patients: EntrepreneurPulse.com.au  Fact Sheet for Healthcare Providers: IncredibleEmployment.be  This test is not yet approved or cleared by the [place] FDA and has been authorized for detection and/or diagnosis of SARS-CoV-2 by FDA under an Emergency Use Authorization (EUA). This EUA will remain in effect (meaning this test can be used) for the duration of the COVID-19 declaration under Section 564(b)(1) of the  Act, 21 U.S.C. section 360bbb-3(b)(1), unless the authorization is terminated or revoked.  Performed at [facility], [place] [address], [place], [place] [address]   MRSA Next Gen by PCR, Nasal     Status: None   Collection Time: 11/02/2020 10:46 PM   Specimen: Nasal Mucosa; Nasal Swab  Result Value Ref Range Status   MRSA by PCR Next Gen NOT DETECTED NOT DETECTED Final    Comment: (NOTE) The GeneXpert MRSA Assay (FDA approved for NASAL specimens only), is one component of a comprehensive MRSA colonization surveillance program. It is not intended to diagnose MRSA infection nor to guide or monitor treatment for MRSA infections. Test performance is not FDA approved in patients less than 54 years old. Performed at [facility], [address] [place], [place] [address]     Lab Basic Metabolic Panel: Recent Labs  Lab 11/22/20 1110 11/01/2020 1408 11/08/2020 2135 12-09-20 0010  NA 129* 126* 130* 126*  K 4.4 4.8 5.0 5.1  CL 98 94* 102 99  CO2 22 18* 16* 14*  GLUCOSE 111* 105* 94 133*  BUN 17 31* 28* 30*  CREATININE 0.84 1.57* 1.62* 2.07*  CALCIUM 9.4 8.5* 7.3* 6.9*  MG  --   --   --  2.1  PHOS  --   --   --  7.6*   Liver Function Tests: Recent Labs  Lab 11/22/20 1110 11/18/2020 1408 09-Dec-2020 0010  AST 41 43* 227*  ALT 14 18 89*  ALKPHOS 80 88 107  BILITOT 1.0 1.1 1.3*  PROT 6.3* 6.2* 3.8*  ALBUMIN 2.8* 2.9* 1.7*   Recent Labs  Lab 11/22/2020 1408  LIPASE 16   No results for  input(s): AMMONIA in the last 168 hours. CBC: Recent Labs  Lab 11/22/20 1110 11/15/2020 1408 Dec 09, 2020 0010  WBC 1.2* 8.6 11.0*  NEUTROABS 0.3* 7.4  --   HGB 13.0 14.2 12.6*  HCT 38.3* 41.5 40.2  MCV 88.0 88.1 97.3  PLT 87* PLATELET CLUMPS NOTED ON SMEAR, COUNT APPEARS DECREASED 72*   Cardiac Enzymes: No results for input(s): CKTOTAL, CKMB, CKMBINDEX, TROPONINI in the last 168 hours. Sepsis Labs: Recent Labs  Lab 11/22/20 1110 10/31/2020 1408 11/28/2020 1723 11/04/2020 1900 11/06/2020 2135 12/09/20 0010 Dec 09, 2020 0246  WBC 1.2* 8.6  --   --   --  11.0*  --   LATICACIDVEN  --   --    < > 6.9* 6.6* 8.4* >9.0*   < > = values in this interval not displayed.    Procedures/Operations  Endotracheal intubation CVC placement A-line placement   Julian Hy Dec 09, 2020, 10:42 AM

## 2020-11-29 NOTE — Care Management Note (Signed)
Attempted arterial line in ICU, with no success. Attempted right side radial arterial line with no success x1 attempt. Ultrasound showed no visible radial artery. Attempted left side radial arterial line with no success x2 attempts. On the left side, I was able to obtain arterial blood in line once, but unable to thread catheter. Attempted to use a wire to thread catheter with no success. Pressure held until hemostasis on both sites. ICU RN informed to please consult attending intensivist to obtain arterial line that I could not place.

## 2020-11-29 NOTE — Procedures (Signed)
Central Venous Catheter Insertion Procedure Note  KAENAN JAKE  [id number]  1945/08/23  Date:12-09-2020  Time:2:25 AM   Provider Performing:Larisha Vencill R Nadeem Romanoski   Procedure: Insertion of Non-tunneled Central Venous [phone]) with US guidance ([id number])   Indication(s) Medication administration  Consent Risks of the procedure as well as the alternatives and risks of each were explained to the patient and/or caregiver.  Consent for the procedure was obtained and is signed in the bedside chart  Anesthesia Topical only with 1% lidocaine   Timeout Verified patient identification, verified procedure, site/side was marked, verified correct patient position, special equipment/implants available, medications/allergies/relevant history reviewed, required imaging and test results available.  Sterile Technique Maximal sterile technique including full sterile barrier drape, hand hygiene, sterile gown, sterile gloves, mask, hair covering, sterile ultrasound probe cover (if used).  Procedure Description Area of catheter insertion was cleaned with chlorhexidine and draped in sterile fashion.  With real-time ultrasound guidance a central venous catheter was placed into the right femoral vein. Nonpulsatile blood flow and easy flushing noted in all ports.  The catheter was sutured in place and sterile dressing applied.  Complications/Tolerance None; patient tolerated the procedure well. Chest X-ray is ordered to verify placement for internal jugular or subclavian cannulation.   Chest x-ray is not ordered for femoral cannulation.  EBL Minimal  Specimen(s) None   Otilio Carpen Ellora Varnum, PA-C

## 2020-11-29 NOTE — Progress Notes (Signed)
Chaplain provided support to family after the death of patient and as they waited to return to see him.  Their pastor arrived after several minutes and remained with the family to accompany them back to the room.  [place], Bcc Pager, [phone] 4:57 PM

## 2020-11-29 NOTE — Procedures (Signed)
Arterial Catheter Insertion Procedure Note  RODNEY YERA  [id number]  [birth year]  Date:Dec 12, 2020  Time:2:25 AM    Provider Performing: Otilio Carpen Maud Rubendall    Procedure: Insertion of Arterial Line [phone]) with US guidance ([id number])   Indication(s) Blood pressure monitoring and/or need for frequent ABGs  Consent Risks of the procedure as well as the alternatives and risks of each were explained to the patient and/or caregiver.  Consent for the procedure was obtained and is signed in the bedside chart  Anesthesia None   Time Out Verified patient identification, verified procedure, site/side was marked, verified correct patient position, special equipment/implants available, medications/allergies/relevant history reviewed, required imaging and test results available.   Sterile Technique Maximal sterile technique including full sterile barrier drape, hand hygiene, sterile gown, sterile gloves, mask, hair covering, sterile ultrasound probe cover (if used).   Procedure Description Area of catheter insertion was cleaned with chlorhexidine and draped in sterile fashion. With real-time ultrasound guidance an arterial catheter was placed into the left femoral artery.  Appropriate arterial tracings confirmed on monitor.     Complications/Tolerance None; patient tolerated the procedure well.   EBL Minimal   Specimen(s) None  \\Kloey Cazarez R Hannia Matchett, PA-C

## 2020-11-29 NOTE — Progress Notes (Signed)
Pharmacy Antibiotic Note  Riley Sharp is a 75 y.o. male admitted on 11/27/2020 with intra-abdominal infection.  Pharmacy has been consulted for zosyn dosing.  Upon admission, patient intubated.  Pharmacy consulted to dose Vancomycin for bacteremia  Plan: Vancomycin 1500mg  IV x 1 followed by 1250 mg IV Q 36 hrs. Goal AUC 400-550.  Expected AUC: 516.8  SCr used: 1.62 Zosyn 3.375g IV q8h (4 hour infusion). Eraxis per MD Follow up renal function (daily SCr while on both Zosyn and Vancomycin) & cultures  Height: 5\' 5"  (165.1 cm) Weight: 69.1 kg (152 lb 5.4 oz) IBW/kg (Calculated) : 61.5  Temp (24hrs), Avg:99.9 F (37.7 C), Min:97.2 F (36.2 C), Max:101.7 F (38.7 C)  Recent Labs  Lab 11/22/20 1110 10/31/2020 1408 11/06/2020 1723 11/05/2020 1900 11/13/2020 2135 2020-12-12 0010  WBC 1.2* 8.6  --   --   --  11.0*  CREATININE 0.84 1.57*  --   --  1.62*  --   LATICACIDVEN  --   --  5.0* 6.9* 6.6*  --      Estimated Creatinine Clearance: 33.7 mL/min (A) (by C-G formula based on SCr of 1.62 mg/dL (H)).    No Known Allergies  Antimicrobials this admission: 10/26 Zosyn >> 10/27 Eraxis >> 10/27 Vancomycin >>  Dose adjustments this admission:  Microbiology results: 10/26 COVID: neg 10/26 Influenza: neg 10/26 BCx: 10/26 MRSA PCR:  Thank you for allowing pharmacy to be a part of this patient's care.  Leone Haven, PharmD December 12, 2020 12:38 AM

## 2020-11-29 NOTE — Code Documentation (Signed)
Code Blue called overhead. I was at bedside within 5 mins. CPR already in progress. Was told that pt was already on IV levophed due to hypotension and that 1 mg IV epi and 1 amp IV bicarb had already been administered.  At next pulse check, pt noted to have coarse Vfib. Pt defibrillated x 1. Rhythm changed to sinus tachycardia but no pulse.  Changed to PEA algorithm. CPR resumed. Another 1 mg IV epi given. Pt had reported been acidotic prior to code blue. Another 1 amp of bicarb given.  At next pulse check, pt noted to have pulse felt in carotid. ROSC called.  Good arterial waveform from arterial line.  PCXR and abg ordered.  Discussed pt's case with his son Riley Sharp and pt's significant other Riley Sharp.  Informed them that pt has potential for another cardiac arrest in a short period of time.  Pt's son and pt's significant other are going to discuss if they want pt to continue with current FULL CODE status.

## 2020-11-29 NOTE — Progress Notes (Signed)
Patient time of death 0924 on 23-Dec-2020, Noemi Chapel, DO notified. Patient made DNR post code, family remained at bedside until time of death. All belongings sent home with family and patient transported to [place].

## 2020-11-29 NOTE — Plan of Care (Signed)
Pt's son jeff and pt's significant other myra have decided to make pt DNR.

## 2020-11-29 DEATH — deceased

## 2020-11-30 ENCOUNTER — Other Ambulatory Visit: Payer: Medicare Other

## 2020-11-30 ENCOUNTER — Ambulatory Visit: Payer: Medicare Other

## 2020-11-30 ENCOUNTER — Ambulatory Visit: Payer: Medicare Other | Admitting: Internal Medicine

## 2020-12-07 ENCOUNTER — Ambulatory Visit: Payer: Medicare Other

## 2020-12-07 ENCOUNTER — Ambulatory Visit: Payer: Medicare Other | Admitting: Internal Medicine

## 2020-12-07 ENCOUNTER — Other Ambulatory Visit: Payer: Medicare Other

## 2020-12-09 ENCOUNTER — Ambulatory Visit: Payer: Medicare Other

## 2020-12-14 ENCOUNTER — Other Ambulatory Visit: Payer: Medicare Other

## 2020-12-21 ENCOUNTER — Other Ambulatory Visit: Payer: Medicare Other

## 2020-12-28 ENCOUNTER — Other Ambulatory Visit: Payer: Medicare Other

## 2020-12-28 ENCOUNTER — Ambulatory Visit: Payer: Medicare Other | Admitting: Internal Medicine

## 2020-12-28 ENCOUNTER — Ambulatory Visit: Payer: Medicare Other

## 2020-12-30 ENCOUNTER — Ambulatory Visit: Payer: Medicare Other

## 2021-01-18 ENCOUNTER — Other Ambulatory Visit: Payer: Medicare Other

## 2021-01-18 ENCOUNTER — Ambulatory Visit: Payer: Medicare Other

## 2021-01-18 ENCOUNTER — Ambulatory Visit: Payer: Medicare Other | Admitting: Physician Assistant

## 2021-01-20 ENCOUNTER — Ambulatory Visit: Payer: Medicare Other

## 2021-02-08 ENCOUNTER — Ambulatory Visit: Payer: Medicare Other | Admitting: Internal Medicine

## 2021-02-08 ENCOUNTER — Other Ambulatory Visit: Payer: Medicare Other

## 2021-02-08 ENCOUNTER — Ambulatory Visit: Payer: Medicare Other

## 2021-02-10 ENCOUNTER — Ambulatory Visit: Payer: Medicare Other

## 2022-04-09 IMAGING — CT CT ABD-PELV W/ CM
2 of 5 series · 12 of 36 positions shown, 15 images · IV contrast (APPLIED)
Comparison: Most recent CT chest, abdomen and pelvis 07/04/2020.
04/20/2020 PET-CT.

CLINICAL DATA: Primary Cancer Type: Lung
TECHNIQUE: Multidetector CT imaging of the chest was performed during
intravenous contrast administration.

CONTRAST:  80mL OMNIPAQUE IOHEXOL 350 MG/ML SOLN, additional oral
enteric contrast

[Series 2: cap with · axial · 0.75mm/px · z∈[-600,-70]mm · 9 of 130 slices shown, 12 images]
[im 12/130  mediastinal]
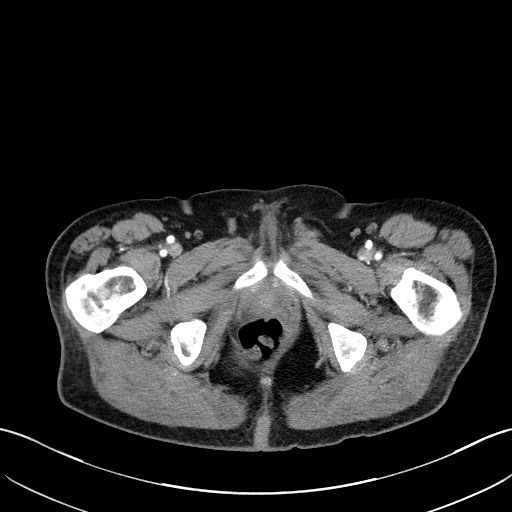
[im 12/130  lung]
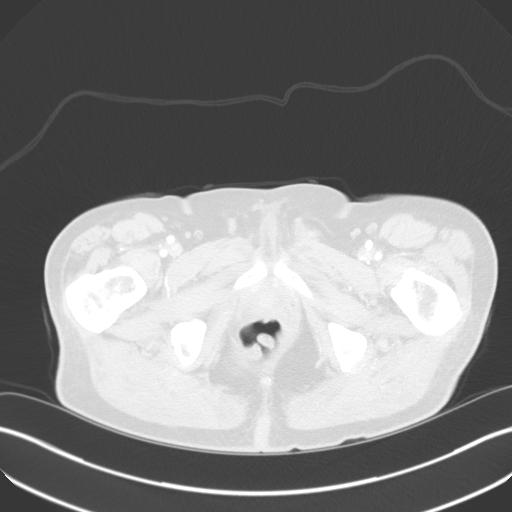
[im 24/130  lung]
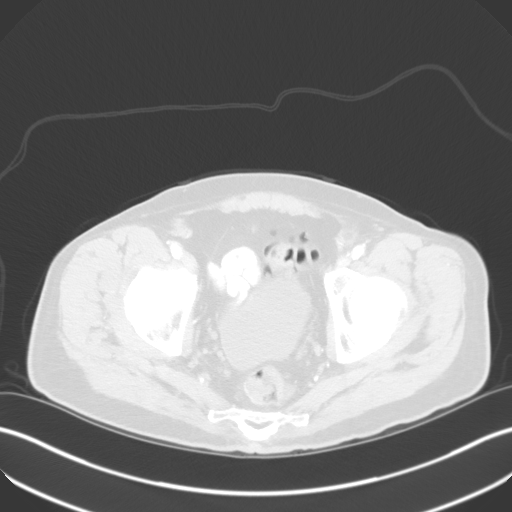
[im 36/130  lung]
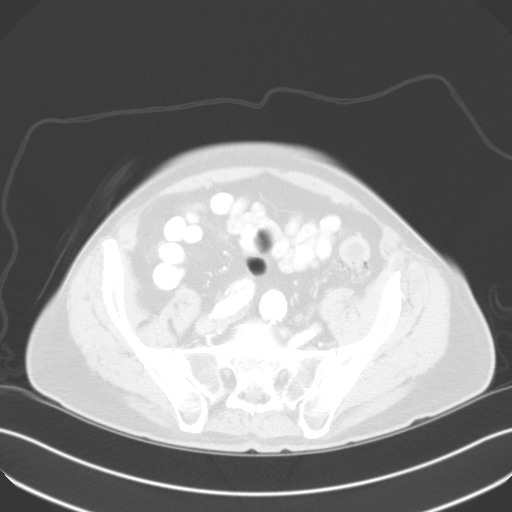
[im 47/130  lung]
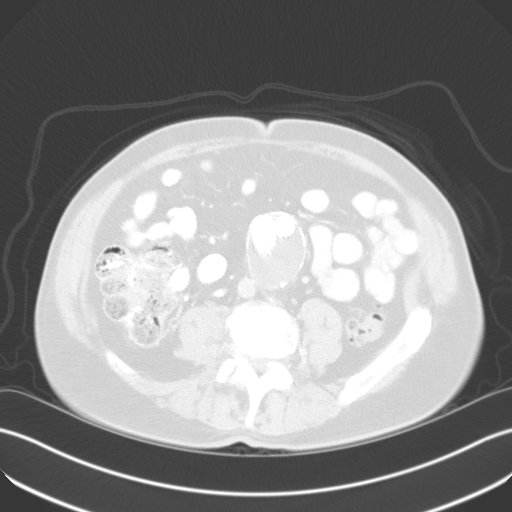
[im 71/130  mediastinal]
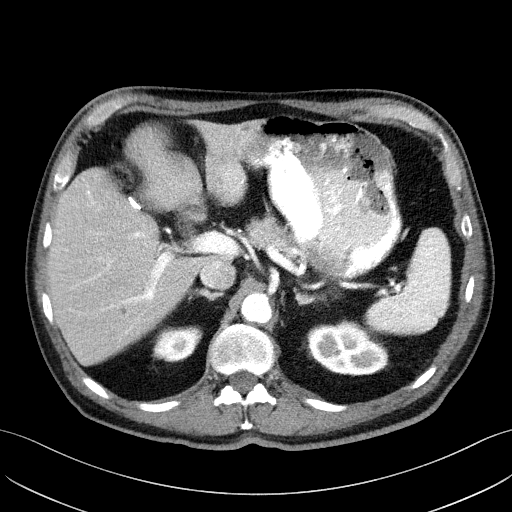
[im 71/130  lung]
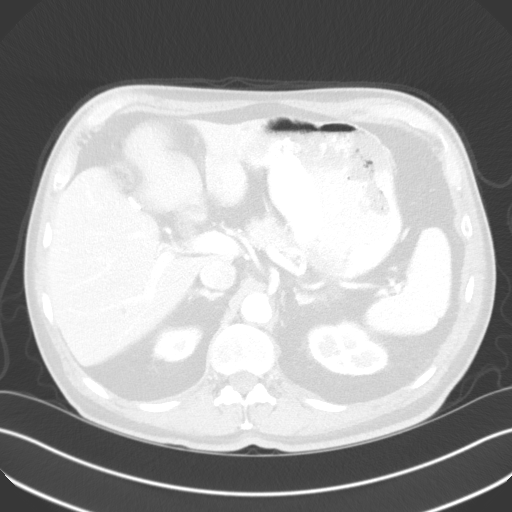
[im 83/130  lung]
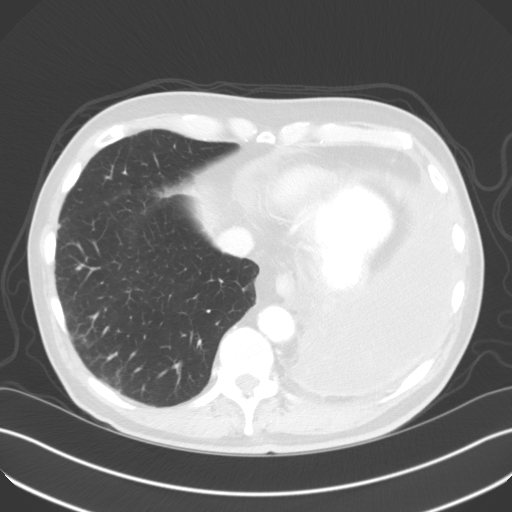
[im 94/130  lung]
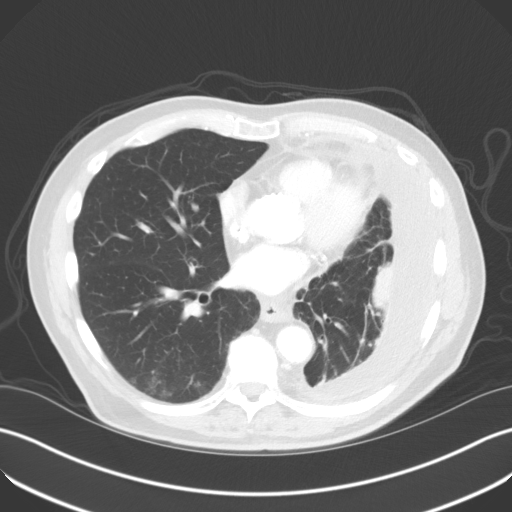
[im 106/130  lung]
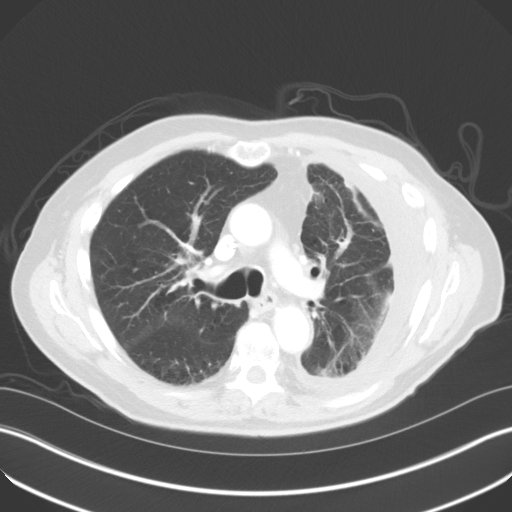
[im 118/130  mediastinal]
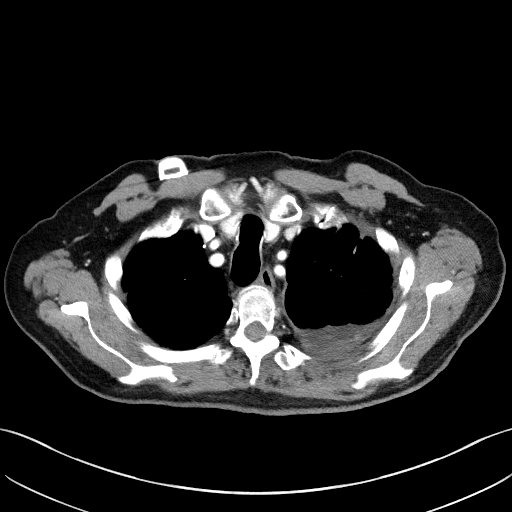
[im 118/130  lung]
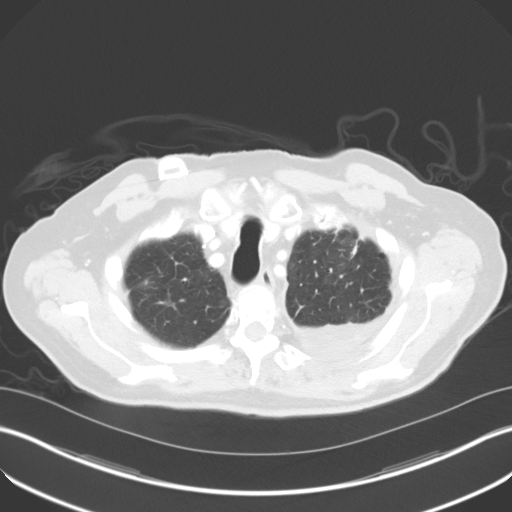

[Series 5: coronals · coronal · 0.82mm/px · 3 of 146 slices shown]
[im 30/146  lung]
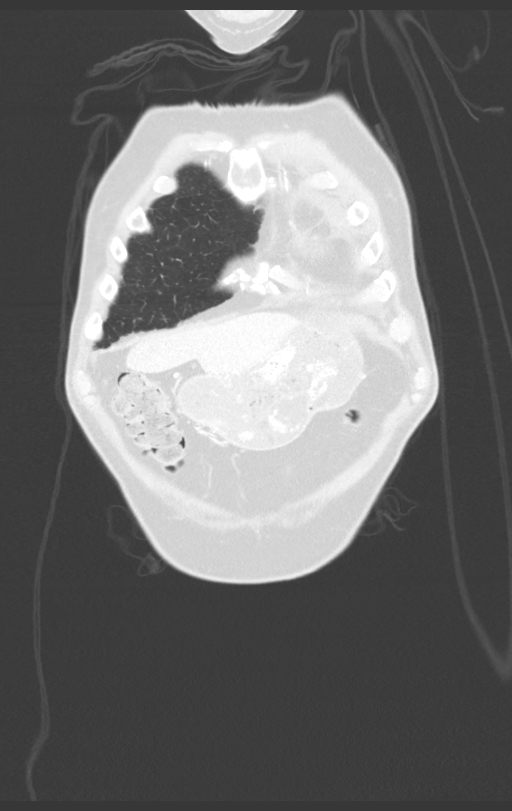
[im 59/146  lung]
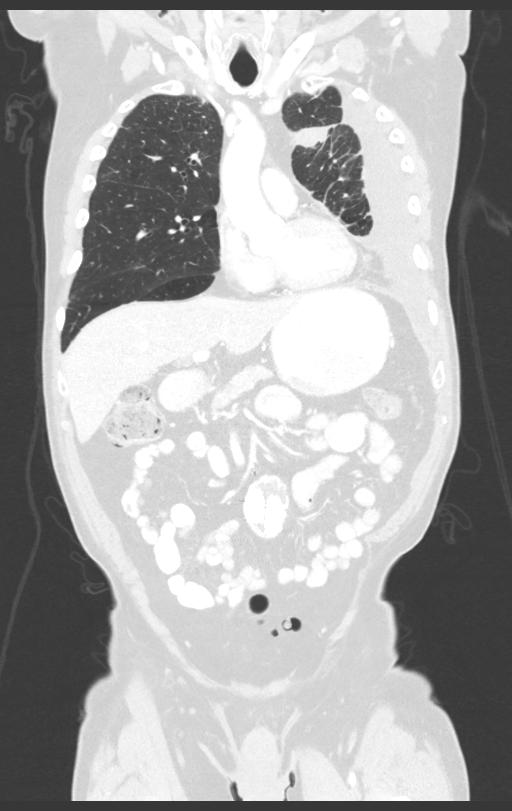
[im 88/146  lung]
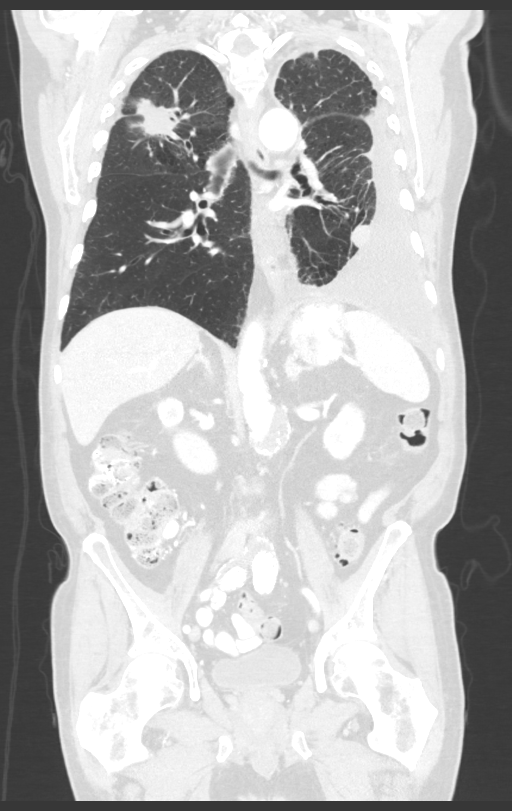

[12 of 36 positions shown; findings below may reference images not displayed]

Imaging Indication: Assess response to therapy

Interval therapy since last imaging? Yes

Initial Cancer Diagnosis

Date: 04/18/2020; Established by: Biopsy-proven

Detailed Pathology: Stage IV non-small cell lung cancer, squamous
cell carcinoma.

Primary Tumor location:  Bilateral upper lobe pulmonary masses.

Surgeries: Aortoiliac stent graft 0249. Appendectomy.
Cholecystectomy. Cardiac stent.

Chemotherapy: Yes; Ongoing? No; Most recent administration:
07/06/2020

Immunotherapy?  Yes; Type: Keytruda; Ongoing? Yes

Radiation therapy? Yes; Date Range: 06/06/2020 - 06/24/2020; Target:
Chest

EXAM:
CT CHEST, ABDOMEN AND PELVIS WITH CONTRAST
FINDINGS: CT CHEST FINDINGS

Cardiovascular: Right chest port catheter. Aortic atherosclerosis.
Normal heart size. Three-vessel coronary artery calcifications no
pericardial effusion.

Mediastinum/Nodes: No enlarged mediastinal, hilar, or axillary lymph
nodes. Thyroid gland, trachea, and esophagus demonstrate no
significant findings.

Lungs/Pleura: Moderate, loculated appearing left pleural effusion is
increased compared to prior examination, with associated pleural
thickening, particularly dependently (series 2, image 48). Unchanged
spiculated masses of the central left upper lobe measuring 5.5 x
cm (series 4, image 51) and of the posterior right upper lobe
measuring 3.1 x 2.4 cm (series 4, image 44). Mild, predominantly
paraseptal emphysema.

Musculoskeletal: No chest wall mass or suspicious bone lesions
identified.

CT ABDOMEN PELVIS FINDINGS

Hepatobiliary: No focal liver abnormality is seen. Status post
cholecystectomy. No biliary dilatation.

Pancreas: Unremarkable. No pancreatic ductal dilatation or
surrounding inflammatory changes.

Spleen: Normal in size without significant abnormality.

Adrenals/Urinary Tract: Adrenal glands are unremarkable. Kidneys are
normal, without renal calculi, solid lesion, or hydronephrosis.
Bladder is unremarkable.

Stomach/Bowel: Stomach is within normal limits. Appendix is not
clearly visualized. Large burden of stool throughout the colon and
rectum. Descending and sigmoid diverticulosis. No evidence of bowel
wall thickening, distention, or inflammatory changes.

Vascular/Lymphatic: Aortic atherosclerosis. Status post aortobiiliac
stent endograft repair of an abdominal aortic aneurysm. No evidence
of endoleak or other complication on this single phase examination.
No enlarged abdominal or pelvic lymph nodes.

Reproductive: No mass or other abnormality.

Other: No abdominal wall hernia or abnormality. No abdominopelvic
ascites.

Musculoskeletal: No acute or significant osseous findings.
IMPRESSION: 1. Unchanged post treatment appearance of spiculated masses of the
central left upper lobe and of the posterior right upper lobe,
findings are consistent with primary lung malignancy.
2. Moderate, loculated appearing left pleural effusion is increased
compared to prior examination, with associated pleural thickening,
particularly dependently. Findings are are concerning for pleural
metastatic disease.
3. No evidence of metastatic disease in the abdomen or pelvis.
4. Emphysema.
5. Coronary artery disease.
6. Status post aortobiiliac stent endograft repair of an abdominal
aortic aneurysm. No evidence of endoleak or other complication on
this non tailored, single phase examination.

Aortic Atherosclerosis (0MVQ3-3ZL.L) and Emphysema (0MVQ3-R6Y.B).

## 2022-04-09 IMAGING — CT CT CHEST W/ CM
1 of 2 series · 6 of 30 positions shown, 8 images · IV contrast (omnipaque)
Comparison: Most recent CT chest, abdomen and pelvis 07/04/2020.
04/20/2020 PET-CT.

CLINICAL DATA: Primary Cancer Type: Lung
TECHNIQUE: Multidetector CT imaging of the chest was performed during
intravenous contrast administration.

CONTRAST:  80mL OMNIPAQUE IOHEXOL 350 MG/ML SOLN, additional oral
enteric contrast

[Series 6: sagittals · sagittal · 0.57mm/px · 6 of 211 slices shown, 8 images]
[im 33/211  mediastinal]
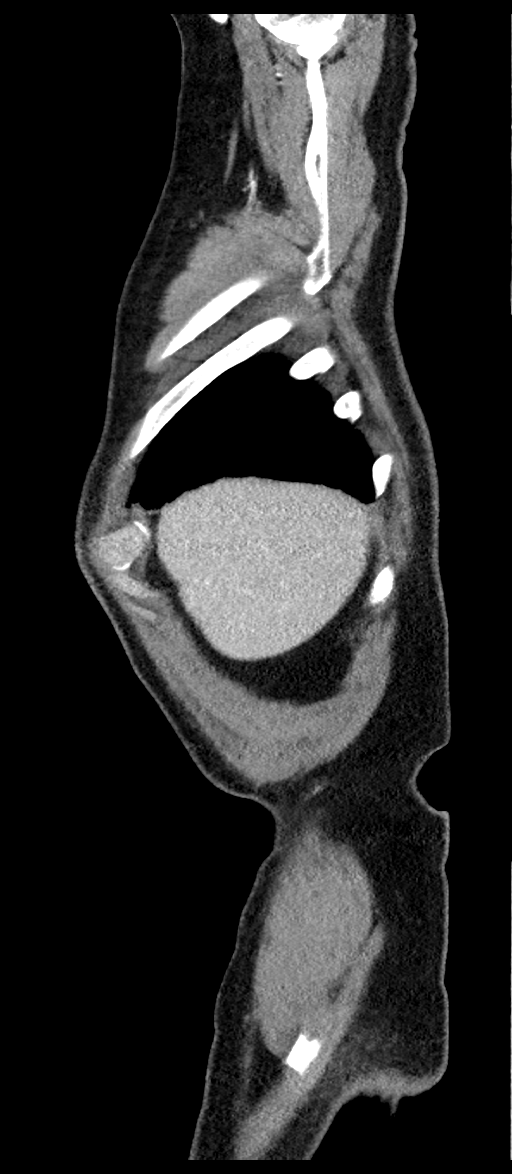
[im 33/211  lung]
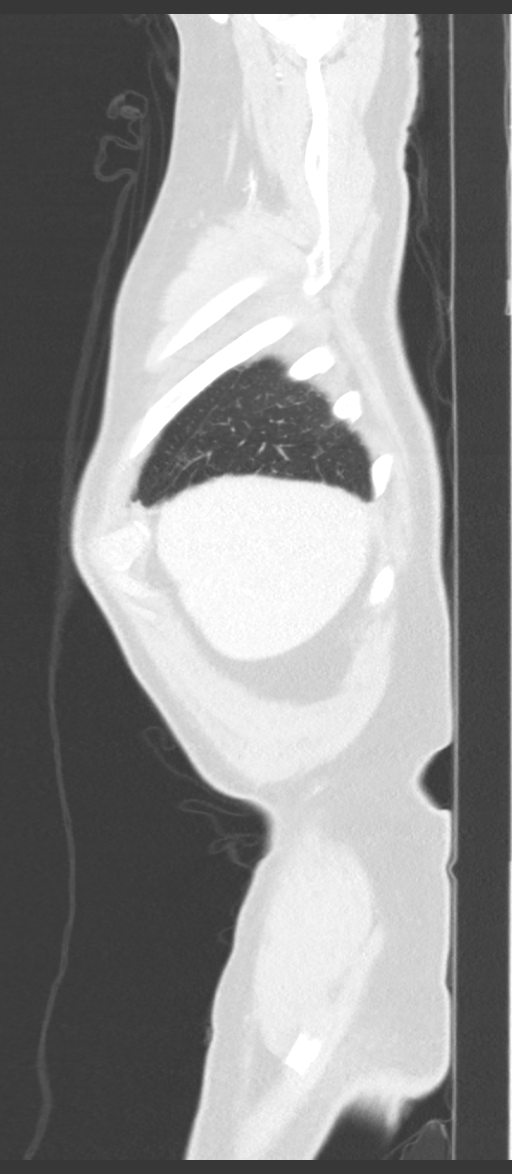
[im 65/211  lung]
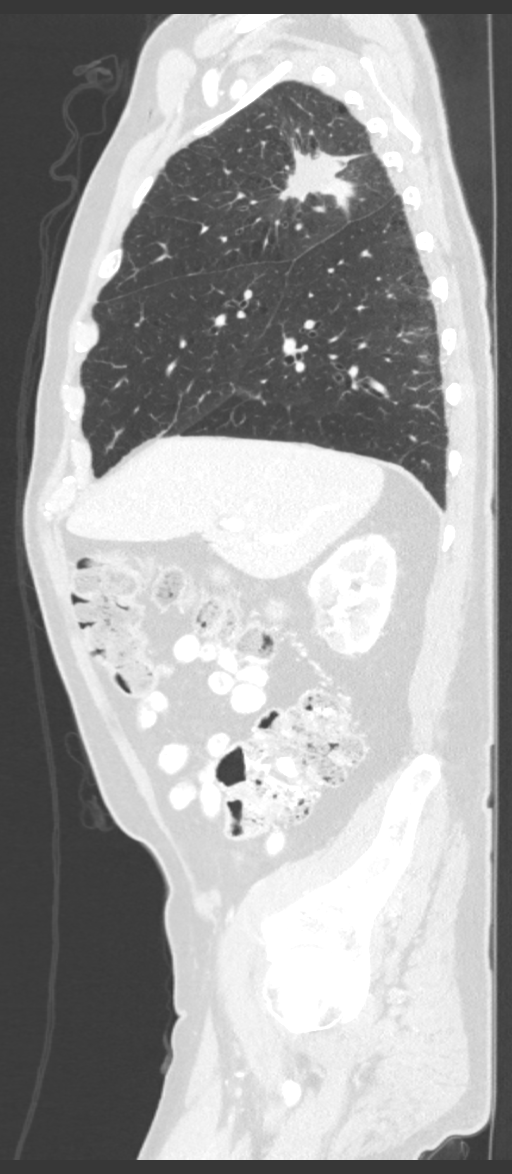
[im 97/211  lung]
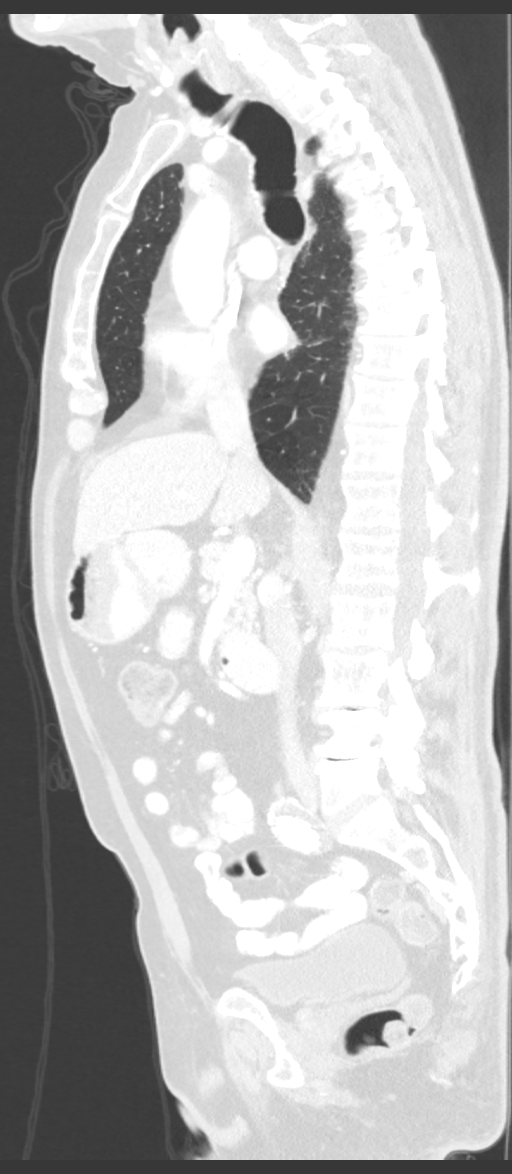
[im 114/211  lung]
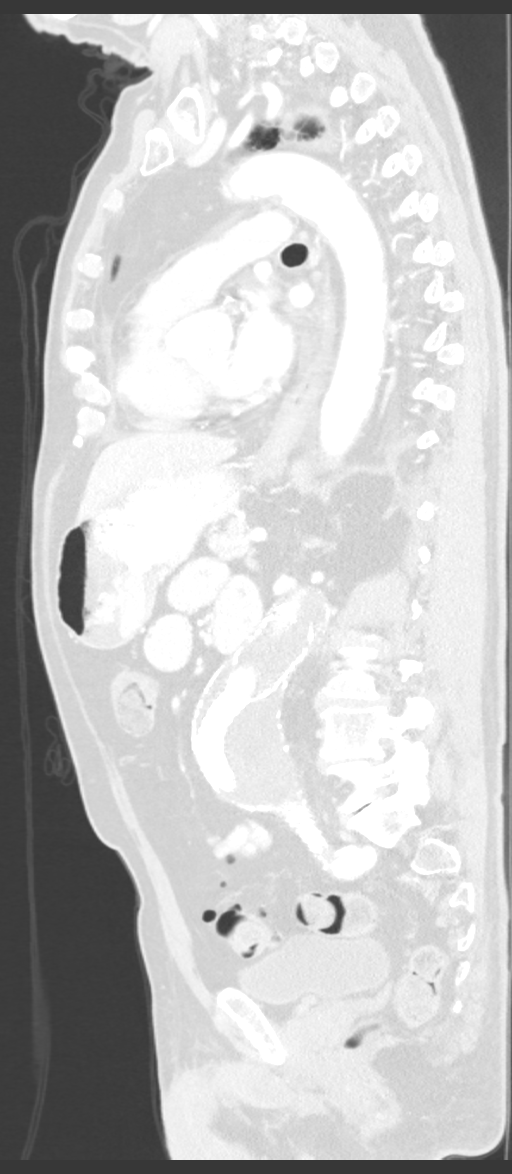
[im 146/211  mediastinal]
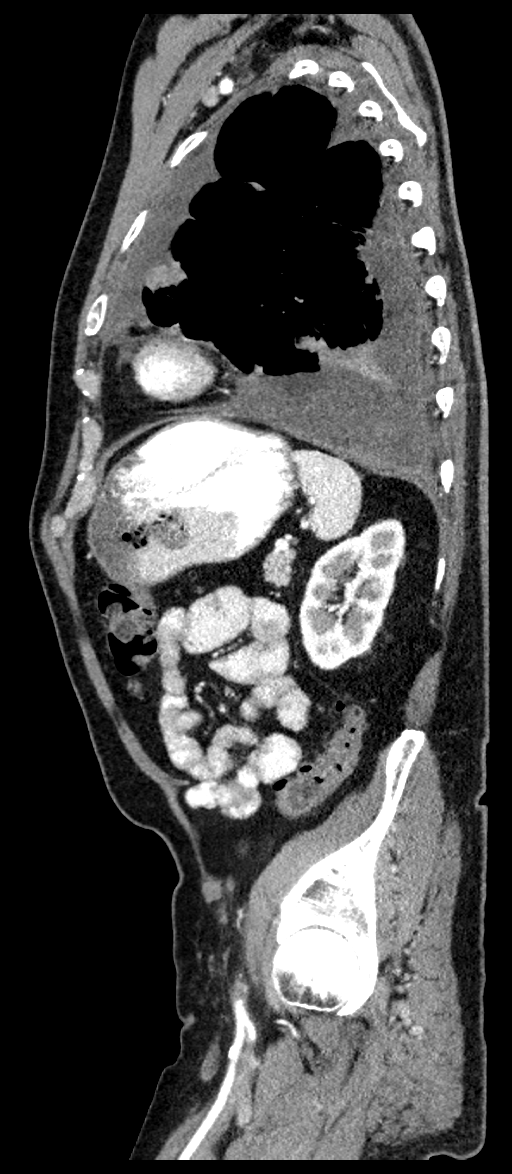
[im 146/211  lung]
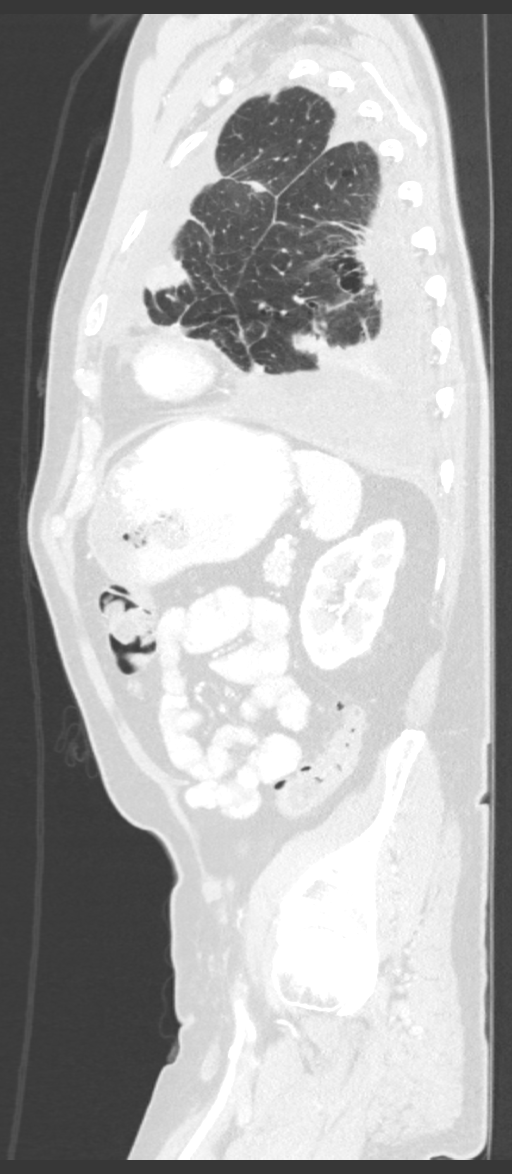
[im 178/211  lung]
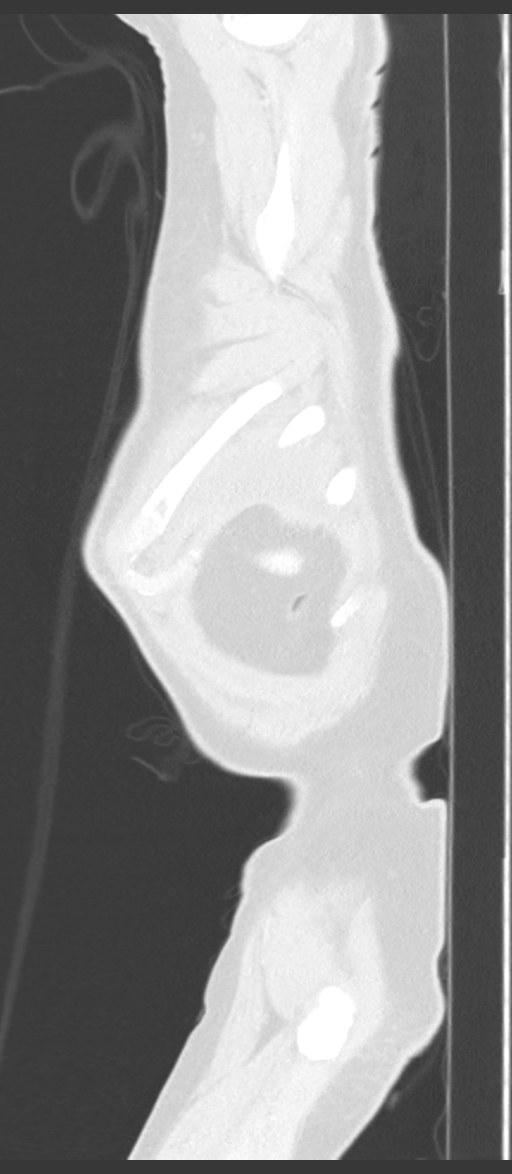

[6 of 30 positions shown; findings below may reference images not displayed]

Imaging Indication: Assess response to therapy

Interval therapy since last imaging? Yes

Initial Cancer Diagnosis

Date: 04/18/2020; Established by: Biopsy-proven

Detailed Pathology: Stage IV non-small cell lung cancer, squamous
cell carcinoma.

Primary Tumor location:  Bilateral upper lobe pulmonary masses.

Surgeries: Aortoiliac stent graft 0249. Appendectomy.
Cholecystectomy. Cardiac stent.

Chemotherapy: Yes; Ongoing? No; Most recent administration:
07/06/2020

Immunotherapy?  Yes; Type: Keytruda; Ongoing? Yes

Radiation therapy? Yes; Date Range: 06/06/2020 - 06/24/2020; Target:
Chest

EXAM:
CT CHEST, ABDOMEN AND PELVIS WITH CONTRAST
FINDINGS: CT CHEST FINDINGS

Cardiovascular: Right chest port catheter. Aortic atherosclerosis.
Normal heart size. Three-vessel coronary artery calcifications no
pericardial effusion.

Mediastinum/Nodes: No enlarged mediastinal, hilar, or axillary lymph
nodes. Thyroid gland, trachea, and esophagus demonstrate no
significant findings.

Lungs/Pleura: Moderate, loculated appearing left pleural effusion is
increased compared to prior examination, with associated pleural
thickening, particularly dependently (series 2, image 48). Unchanged
spiculated masses of the central left upper lobe measuring 5.5 x
cm (series 4, image 51) and of the posterior right upper lobe
measuring 3.1 x 2.4 cm (series 4, image 44). Mild, predominantly
paraseptal emphysema.

Musculoskeletal: No chest wall mass or suspicious bone lesions
identified.

CT ABDOMEN PELVIS FINDINGS

Hepatobiliary: No focal liver abnormality is seen. Status post
cholecystectomy. No biliary dilatation.

Pancreas: Unremarkable. No pancreatic ductal dilatation or
surrounding inflammatory changes.

Spleen: Normal in size without significant abnormality.

Adrenals/Urinary Tract: Adrenal glands are unremarkable. Kidneys are
normal, without renal calculi, solid lesion, or hydronephrosis.
Bladder is unremarkable.

Stomach/Bowel: Stomach is within normal limits. Appendix is not
clearly visualized. Large burden of stool throughout the colon and
rectum. Descending and sigmoid diverticulosis. No evidence of bowel
wall thickening, distention, or inflammatory changes.

Vascular/Lymphatic: Aortic atherosclerosis. Status post aortobiiliac
stent endograft repair of an abdominal aortic aneurysm. No evidence
of endoleak or other complication on this single phase examination.
No enlarged abdominal or pelvic lymph nodes.

Reproductive: No mass or other abnormality.

Other: No abdominal wall hernia or abnormality. No abdominopelvic
ascites.

Musculoskeletal: No acute or significant osseous findings.
IMPRESSION: 1. Unchanged post treatment appearance of spiculated masses of the
central left upper lobe and of the posterior right upper lobe,
findings are consistent with primary lung malignancy.
2. Moderate, loculated appearing left pleural effusion is increased
compared to prior examination, with associated pleural thickening,
particularly dependently. Findings are are concerning for pleural
metastatic disease.
3. No evidence of metastatic disease in the abdomen or pelvis.
4. Emphysema.
5. Coronary artery disease.
6. Status post aortobiiliac stent endograft repair of an abdominal
aortic aneurysm. No evidence of endoleak or other complication on
this non tailored, single phase examination.

Aortic Atherosclerosis (0MVQ3-3ZL.L) and Emphysema (0MVQ3-R6Y.B).

## 2022-06-12 IMAGING — CT CT CHEST W/ CM
2 of 5 series · 12 of 36 positions shown, 15 images · IV contrast (APPLIED)
Comparison: CT chest 09/19/2020 and CT chest abdomen pelvis
09/05/2020.

CLINICAL DATA: Lung cancer treated with chemotherapy and radiation
therapy. Ongoing Keytruda. Worsening shortness of breath.

EXAM:
CT CHEST, ABDOMEN, AND PELVIS WITH CONTRAST
TECHNIQUE: Multidetector CT imaging of the chest, abdomen and pelvis was
performed following the standard protocol during bolus
administration of intravenous contrast.
CONTRAST:  80mL OMNIPAQUE IOHEXOL 350 MG/ML SOLN

[Series 2: cap with · axial · 0.77mm/px · z∈[-551,-41]mm · 9 of 128 slices shown, 12 images]
[im 13/128  mediastinal]
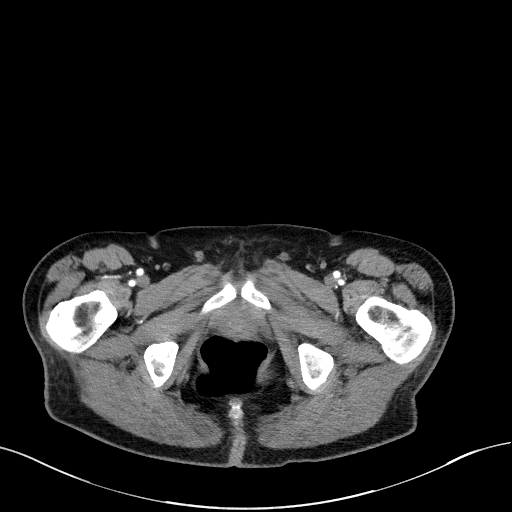
[im 13/128  lung]
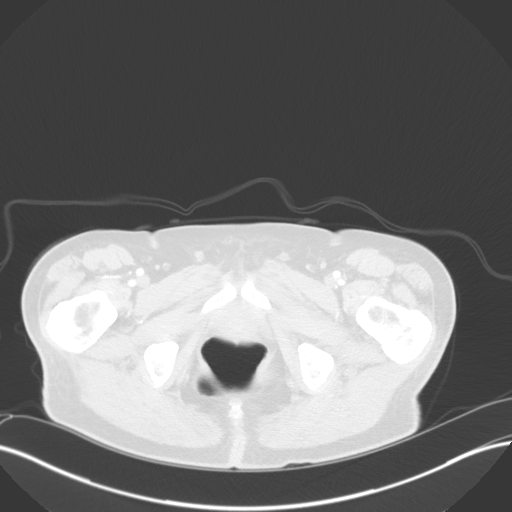
[im 26/128  lung]
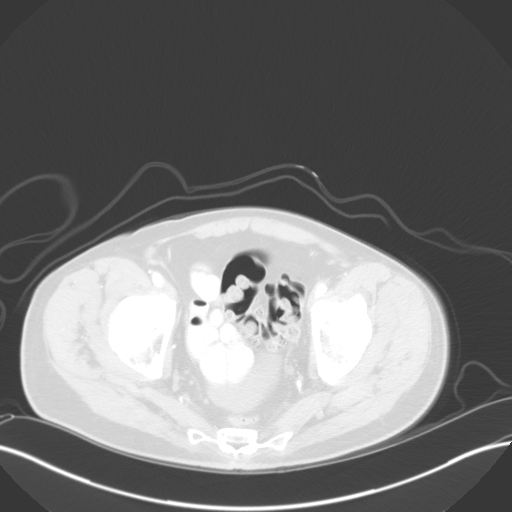
[im 39/128  lung]
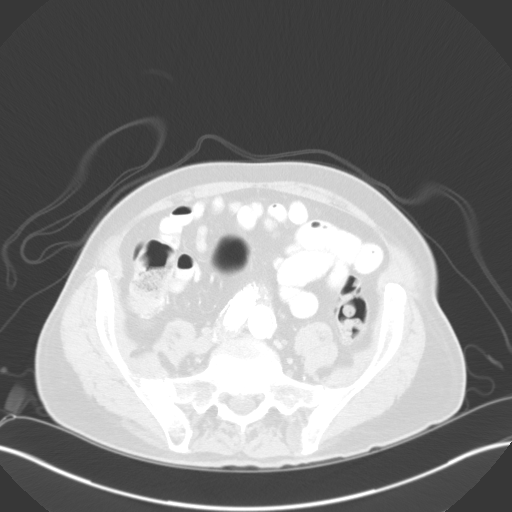
[im 51/128  lung]
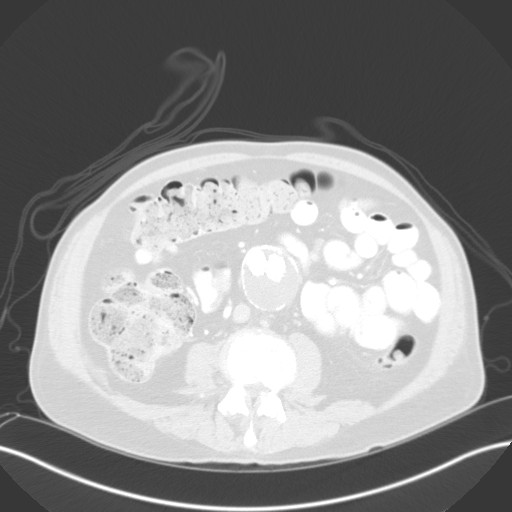
[im 64/128  mediastinal]
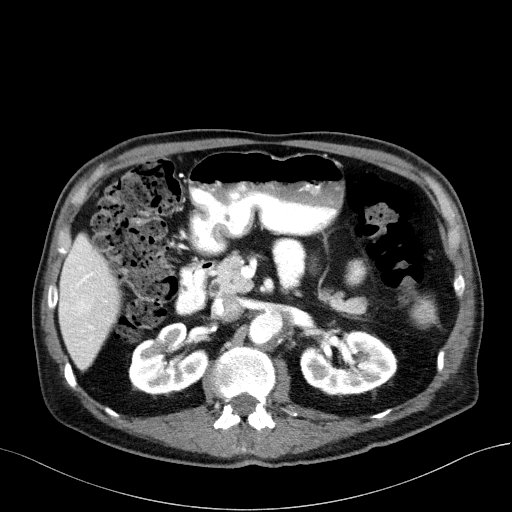
[im 64/128  lung]
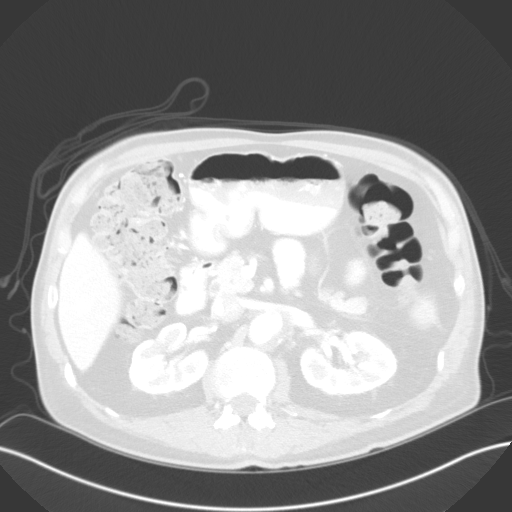
[im 77/128  lung]
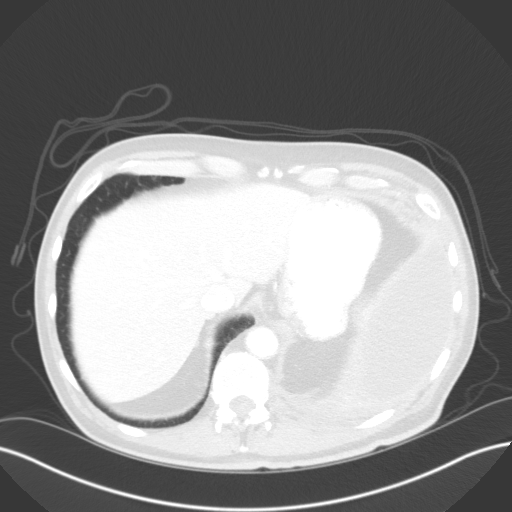
[im 89/128  lung]
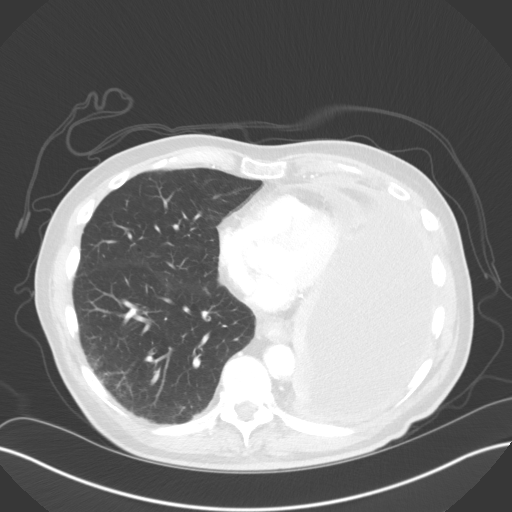
[im 102/128  lung]
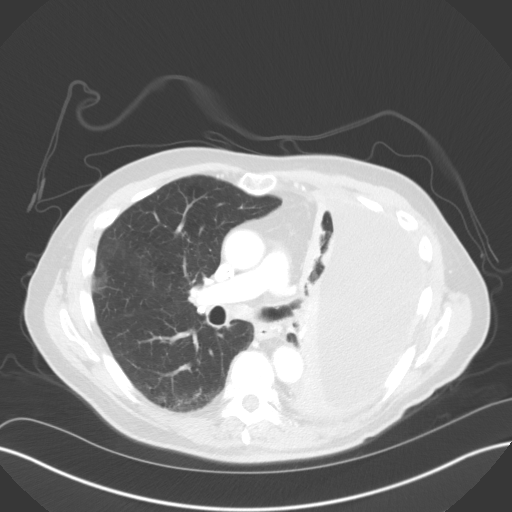
[im 115/128  mediastinal]
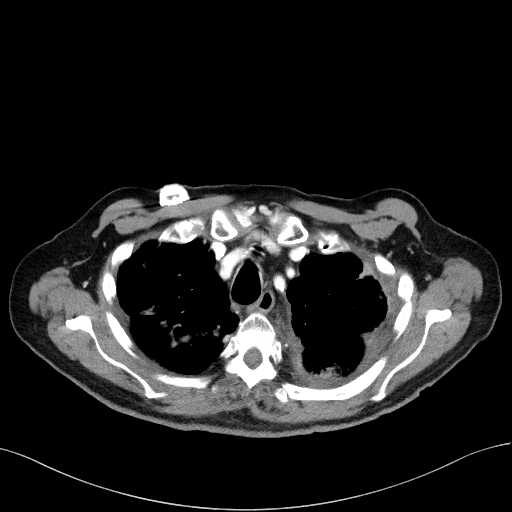
[im 115/128  lung]
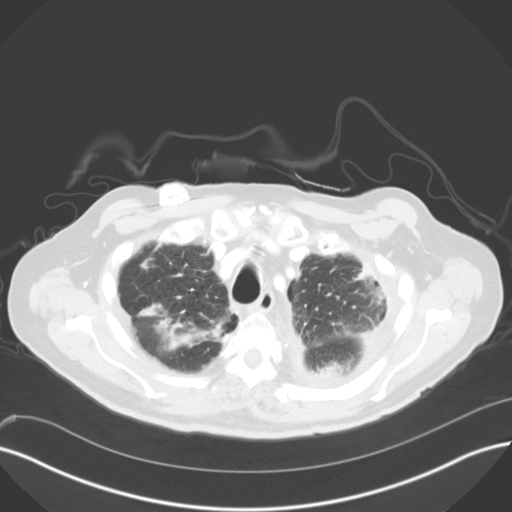

[Series 5: coronals · coronal · 0.82mm/px · 3 of 137 slices shown]
[im 28/137  lung]
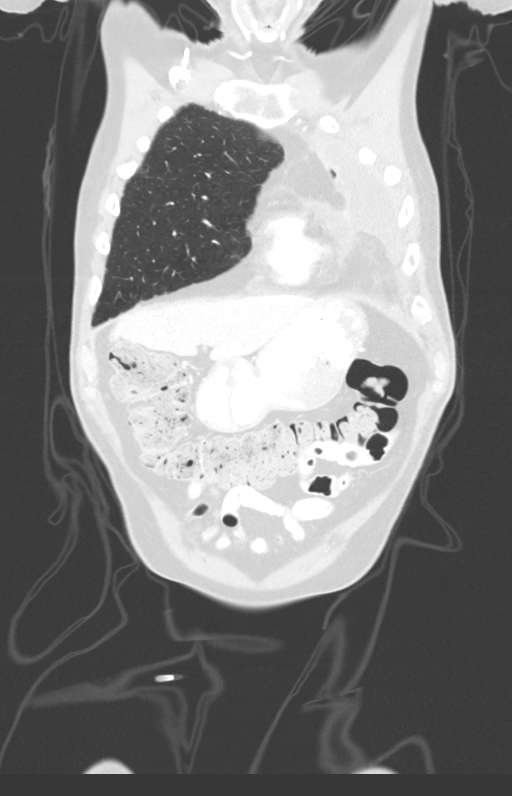
[im 55/137  lung]
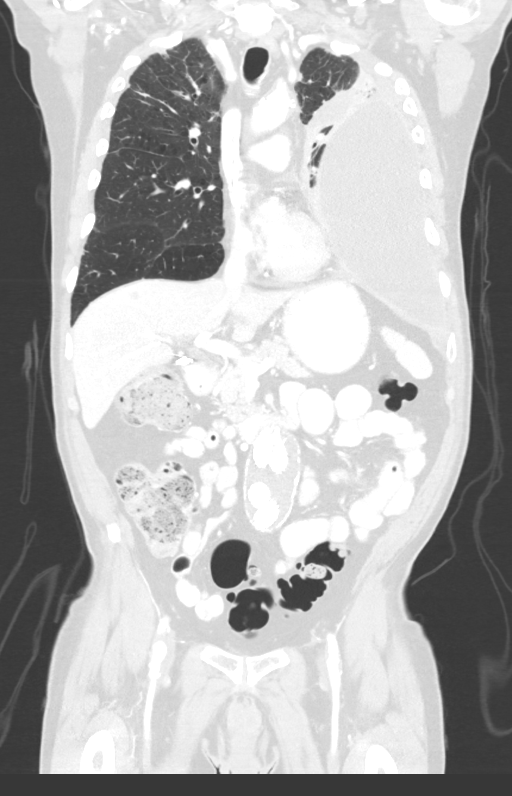
[im 82/137  lung]
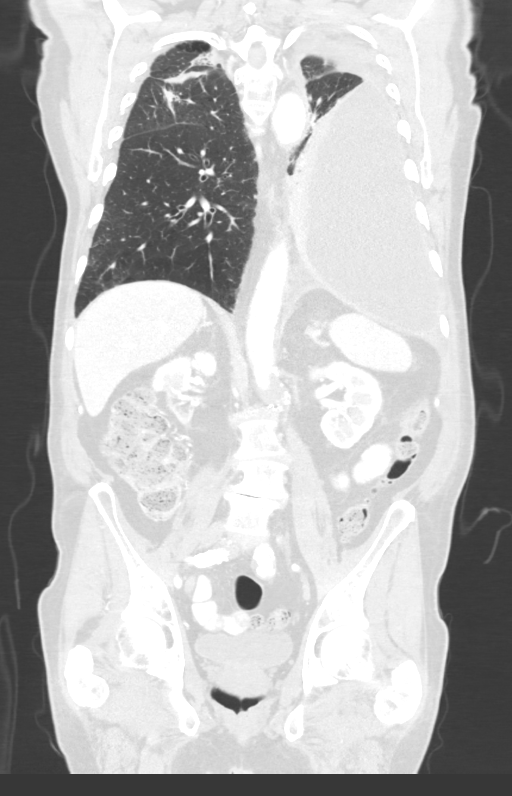

[12 of 36 positions shown; findings below may reference images not displayed]

FINDINGS: CT CHEST FINDINGS

Cardiovascular: Right IJ Port-A-Cath terminates at the SVC RA
junction. Atherosclerotic calcification of the aorta and coronary
arteries. Heart size normal. Small pericardial effusion is new.
There may be slight associated peripheral hyperattenuation.

Mediastinum/Nodes: No pathologically enlarged mediastinal or hilar
lymph nodes. Calcified subcarinal and hilar lymph nodes. No axillary
adenopathy. Esophagus is unremarkable.

Lungs/Pleura: Enlarging loculated thick-walled left pleural fluid
collection with compressive atelectasis in the left upper and left
lower lobes. Nodular and masslike areas of consolidation in both
upper lobes appear similar to 09/19/2020. Centrilobular emphysema. 4
mm subpleural peripheral right lower lobe nodule (7/93), increased
in size from 3 mm on 09/05/2020. No right pleural fluid. Airway is
unremarkable.

Musculoskeletal: There are new lytic lesions, namely within the
right second and fifth ribs with a pathologic fracture involving the
right fifth rib. Lytic lesion in the T7 vertebral body is new.
Possible lytic lesion in the T1 vertebral body.

CT ABDOMEN PELVIS FINDINGS

Hepatobiliary: New mildly heterogeneous lesions in the liver measure
up to 2.0 cm in the dome of the right hepatic lobe. Cholecystectomy.
No abnormal biliary ductal dilatation.

Pancreas: Negative.

Spleen: Negative.

Adrenals/Urinary Tract: Adrenal glands are unremarkable. Scarring in
the lower pole right kidney. Subcentimeter low-attenuation lesions
in the kidneys are too small to characterize but statistically,
cysts are likely. Ureters are decompressed. Bladder is grossly
unremarkable.

Stomach/Bowel: Stomach, small bowel and colon are unremarkable. Fair
amount of stool is seen in the colon, indicative of constipation.
Appendix is not readily visualized.

Vascular/Lymphatic: Atherosclerotic calcification of the aorta with
an endovascular aorto bi-iliac stent graft. Native aneurysm sac
measures 5.5 cm, as before. Gastrohepatic ligament lymph nodes are
not enlarged by CT size criteria.

Reproductive: Prostate is visualized.

Other: No free fluid.  Mesenteries and peritoneum are unremarkable.

Musculoskeletal: New lytic lesion in the right iliac wing.
Degenerative changes in the spine. Levoconvex scoliosis.
IMPRESSION: 1. Normal progression disease as evidenced by an enlarging right
pulmonary nodule, new hepatic metastatic disease and new osseous
metastatic disease.
2. Masslike areas of consolidation in the upper lobes, similar.
3. Large loculated left pleural effusion with pleural thickening,
increased in size from 09/19/2020 and compatible with an empyema or
malignant pleural effusion.
4. New small pericardial effusion. Peripheral hyperattenuation,
difficult to exclude pericarditis.
5. Aorto bi-iliac endograft stent with stable native aneurysm sac
measuring 5.5 cm.
6. Aortic atherosclerosis ([id number]-[id number].J). Coronary artery
calcification.
7.  Emphysema ([id number].G).

## 2022-12-20 NOTE — Telephone Encounter (Signed)
Telephone call
